# Patient Record
Sex: Female | Born: 1960
Health system: Southern US, Community
[De-identification: ages and names within clinical notes are randomized; demographics above are authoritative.]

## PROBLEM LIST (undated history)

## (undated) DIAGNOSIS — E079 Disorder of thyroid, unspecified: Secondary | ICD-10-CM

## (undated) DIAGNOSIS — E785 Hyperlipidemia, unspecified: Secondary | ICD-10-CM

## (undated) DIAGNOSIS — K219 Gastro-esophageal reflux disease without esophagitis: Secondary | ICD-10-CM

## (undated) DIAGNOSIS — F319 Bipolar disorder, unspecified: Secondary | ICD-10-CM

## (undated) DIAGNOSIS — F419 Anxiety disorder, unspecified: Secondary | ICD-10-CM

## (undated) DIAGNOSIS — F32A Depression, unspecified: Secondary | ICD-10-CM

## (undated) DIAGNOSIS — F329 Major depressive disorder, single episode, unspecified: Secondary | ICD-10-CM

## (undated) HISTORY — PX: CHOLECYSTECTOMY: SHX55

## (undated) HISTORY — PX: BACK SURGERY: SHX140

## (undated) HISTORY — PX: FRACTURE SURGERY: SHX138

## (undated) HISTORY — PX: ANKLE SURGERY: SHX546

## (undated) HISTORY — DX: Hyperlipidemia, unspecified: E78.5

## (undated) HISTORY — DX: Gastro-esophageal reflux disease without esophagitis: K21.9

## (undated) HISTORY — PX: ANTERIOR CRUCIATE LIGAMENT REPAIR: SHX115

## (undated) HISTORY — PX: TENDON REPAIR: SHX5111

## (undated) HISTORY — DX: Anxiety disorder, unspecified: F41.9

## (undated) HISTORY — DX: Depression, unspecified: F32.A

## (undated) HISTORY — DX: Major depressive disorder, single episode, unspecified: F32.9

## (undated) HISTORY — PX: TONSILLECTOMY: SUR1361

---

## 2014-02-06 ENCOUNTER — Encounter (HOSPITAL_COMMUNITY): Payer: Self-pay | Admitting: Emergency Medicine

## 2014-02-06 ENCOUNTER — Emergency Department (HOSPITAL_COMMUNITY): Payer: Medicare Other

## 2014-02-06 ENCOUNTER — Emergency Department (HOSPITAL_COMMUNITY)
Admission: EM | Admit: 2014-02-06 | Discharge: 2014-02-06 | Disposition: A | Discharge status: Home / Self Care | Payer: Medicare Other | Attending: Emergency Medicine | Admitting: Emergency Medicine

## 2014-02-06 DIAGNOSIS — Y9289 Other specified places as the place of occurrence of the external cause: Secondary | ICD-10-CM | POA: Insufficient documentation

## 2014-02-06 DIAGNOSIS — Y9389 Activity, other specified: Secondary | ICD-10-CM | POA: Insufficient documentation

## 2014-02-06 DIAGNOSIS — M25471 Effusion, right ankle: Secondary | ICD-10-CM

## 2014-02-06 DIAGNOSIS — S82491A Other fracture of shaft of right fibula, initial encounter for closed fracture: Secondary | ICD-10-CM | POA: Diagnosis not present

## 2014-02-06 DIAGNOSIS — X58XXXA Exposure to other specified factors, initial encounter: Secondary | ICD-10-CM | POA: Diagnosis not present

## 2014-02-06 DIAGNOSIS — Z8659 Personal history of other mental and behavioral disorders: Secondary | ICD-10-CM | POA: Insufficient documentation

## 2014-02-06 DIAGNOSIS — M25571 Pain in right ankle and joints of right foot: Secondary | ICD-10-CM

## 2014-02-06 DIAGNOSIS — Z8639 Personal history of other endocrine, nutritional and metabolic disease: Secondary | ICD-10-CM | POA: Insufficient documentation

## 2014-02-06 DIAGNOSIS — S99911A Unspecified injury of right ankle, initial encounter: Secondary | ICD-10-CM | POA: Diagnosis present

## 2014-02-06 DIAGNOSIS — Y998 Other external cause status: Secondary | ICD-10-CM | POA: Diagnosis not present

## 2014-02-06 DIAGNOSIS — T1490XA Injury, unspecified, initial encounter: Secondary | ICD-10-CM

## 2014-02-06 DIAGNOSIS — S82831A Other fracture of upper and lower end of right fibula, initial encounter for closed fracture: Secondary | ICD-10-CM

## 2014-02-06 HISTORY — DX: Disorder of thyroid, unspecified: E07.9

## 2014-02-06 HISTORY — DX: Bipolar disorder, unspecified: F31.9

## 2014-02-06 MED ORDER — HYDROCODONE-ACETAMINOPHEN 5-325 MG PO TABS: 1.0000 | ORAL_TABLET | Freq: Four times a day (QID) | ORAL | Status: DC | PRN

## 2014-02-06 MED ORDER — NAPROXEN 500 MG PO TABS: 500.0000 mg | ORAL_TABLET | Freq: Two times a day (BID) | ORAL | Status: DC | PRN

## 2014-02-06 NOTE — ED Notes (Signed)
Ortho Tech notified  

## 2014-02-06 NOTE — Progress Notes (Signed)
Orthopedic Tech Progress Note Patient Details:  Rachel CrawfordSusan Everett 1960/08/06 [id number]  Ortho Devices Type of Ortho Device: Ace wrap, Crutches, Post (short leg) splint Ortho Device/Splint Location: rle Ortho Device/Splint Interventions: Application   Rachel Everett 02/06/2014, 10:51 AM

## 2014-02-06 NOTE — Discharge Instructions (Signed)
Wear ankle splint until you've seen the orthopedist. Use crutches for all weight bearing activities. Ice and elevate ankle throughout the day. Alternate between naprosyn and percocet for pain relief. Do not drive or operate machinery with pain medication use. Call orthopedic follow up today or tomorrow to schedule followup appointment for within 1 week. Return to the ER for changes or worsening symptoms.   Ankle Fracture A fracture is a break in a bone. A cast or splint may be used to protect the ankle and heal the break. Sometimes, surgery is needed. HOME CARE  Use crutches as told by your doctor. It is very important that you use your crutches correctly.  Do not put weight or pressure on the injured ankle until told by your doctor.  Keep your ankle raised (elevated) when sitting or lying down.  Apply ice to the ankle:  Put ice in a plastic bag.  Place a towel between your cast and the bag.  Leave the ice on for 20 minutes, 2-3 times a day.  If you have a plaster or fiberglass cast:  Do not try to scratch under the cast with any objects.  Check the skin around the cast every day. You may put lotion on red or sore areas.  Keep your cast dry and clean.  If you have a plaster splint:  Wear the splint as told by your doctor.  You can loosen the elastic around the splint if your toes get numb, tingle, or turn cold or blue.  Do not put pressure on any part of your cast or splint. It may break. Rest your plaster splint or cast only on a pillow the first 24 hours until it is fully hardened.  Cover your cast or splint with a plastic bag during showers.  Do not lower your cast or splint into water.  Take medicine as told by your doctor.  Do not drive until your doctor says it is safe.  Follow-up with your doctor as told. It is very important that you go to your follow-up visits. GET HELP IF: The swelling and discomfort gets worse.  GET HELP RIGHT AWAY IF:   Your splint or  cast breaks.  You continue to have very bad pain.  You have new pain or swelling after your splint or cast was put on.  Your skin or toes below the injured ankle:  Turn blue or gray.  Feel cold, numb, or you cannot feel them.  There is a bad smell or yellowish white fluid (pus) coming from under the splint or cast. MAKE SURE YOU:   Understand these instructions.  Will watch your condition.  Will get help right away if you are not doing well or get worse. Document Released: 12/29/2008 Document Revised: 12/22/2012 Document Reviewed: 09/30/2012 [facility] Patient Information 2015 [place], [place]. This information is not intended to replace advice given to you by your health care provider. Make sure you discuss any questions you have with your health care provider.  Cast or Splint Care Casts and splints support injured limbs and keep bones from moving while they heal. It is important to care for your cast or splint at home.  HOME CARE INSTRUCTIONS  Keep the cast or splint uncovered during the drying period. It can take 24 to 48 hours to dry if it is made of plaster. A fiberglass cast will dry in less than 1 hour.  Do not rest the cast on anything harder than a pillow for the first 24 hours.  Do not put weight on your injured limb or apply pressure to the cast until your health care provider gives you permission.  Keep the cast or splint dry. Wet casts or splints can lose their shape and may not support the limb as well. A wet cast that has lost its shape can also create harmful pressure on your skin when it dries. Also, wet skin can become infected.  Cover the cast or splint with a plastic bag when bathing or when out in the rain or snow. If the cast is on the trunk of the body, take sponge baths until the cast is removed.  If your cast does become wet, dry it with a towel or a blow dryer on the cool setting only.  Keep your cast or splint clean. Soiled casts may be wiped with a  moistened cloth.  Do not place any hard or soft foreign objects under your cast or splint, such as cotton, toilet paper, lotion, or powder.  Do not try to scratch the skin under the cast with any object. The object could get stuck inside the cast. Also, scratching could lead to an infection. If itching is a problem, use a blow dryer on a cool setting to relieve discomfort.  Do not trim or cut your cast or remove padding from inside of it.  Exercise all joints next to the injury that are not immobilized by the cast or splint. For example, if you have a long leg cast, exercise the hip joint and toes. If you have an arm cast or splint, exercise the shoulder, elbow, thumb, and fingers.  Elevate your injured arm or leg on 1 or 2 pillows for the first 1 to 3 days to decrease swelling and pain.It is best if you can comfortably elevate your cast so it is higher than your heart. SEEK MEDICAL CARE IF:   Your cast or splint cracks.  Your cast or splint is too tight or too loose.  You have unbearable itching inside the cast.  Your cast becomes wet or develops a soft spot or area.  You have a bad smell coming from inside your cast.  You get an object stuck under your cast.  Your skin around the cast becomes red or raw.  You have new pain or worsening pain after the cast has been applied. SEEK IMMEDIATE MEDICAL CARE IF:   You have fluid leaking through the cast.  You are unable to move your fingers or toes.  You have discolored (blue or white), cool, painful, or very swollen fingers or toes beyond the cast.  You have tingling or numbness around the injured area.  You have severe pain or pressure under the cast.  You have any difficulty with your breathing or have shortness of breath.  You have chest pain. Document Released: 02/29/2000 Document Revised: 12/22/2012 Document Reviewed: 09/09/2012 [facility] Patient Information 2015 [place], [place]. This information is not intended to replace  advice given to you by your health care provider. Make sure you discuss any questions you have with your health care provider.  Cryotherapy Cryotherapy means treatment with cold. Ice or gel packs can be used to reduce both pain and swelling. Ice is the most helpful within the first 24 to 48 hours after an injury or flare-up from overusing a muscle or joint. Sprains, strains, spasms, burning pain, shooting pain, and aches can all be eased with ice. Ice can also be used when recovering from surgery. Ice is effective, has very few  side effects, and is safe for most people to use. PRECAUTIONS  Ice is not a safe treatment option for people with:  Raynaud phenomenon. This is a condition affecting small blood vessels in the extremities. Exposure to cold may cause your problems to return.  Cold hypersensitivity. There are many forms of cold hypersensitivity, including:  Cold urticaria. Red, itchy hives appear on the skin when the tissues begin to warm after being iced.  Cold erythema. This is a red, itchy rash caused by exposure to cold.  Cold hemoglobinuria. Red blood cells break down when the tissues begin to warm after being iced. The hemoglobin that carry oxygen are passed into the urine because they cannot combine with blood proteins fast enough.  Numbness or altered sensitivity in the area being iced. If you have any of the following conditions, do not use ice until you have discussed cryotherapy with your caregiver:  Heart conditions, such as arrhythmia, angina, or chronic heart disease.  High blood pressure.  Healing wounds or open skin in the area being iced.  Current infections.  Rheumatoid arthritis.  Poor circulation.  Diabetes. Ice slows the blood flow in the region it is applied. This is beneficial when trying to stop inflamed tissues from spreading irritating chemicals to surrounding tissues. However, if you expose your skin to cold temperatures for too long or without the  proper protection, you can damage your skin or nerves. Watch for signs of skin damage due to cold. HOME CARE INSTRUCTIONS Follow these tips to use ice and cold packs safely.  Place a dry or damp towel between the ice and skin. A damp towel will cool the skin more quickly, so you may need to shorten the time that the ice is used.  For a more rapid response, add gentle compression to the ice.  Ice for no more than 10 to 20 minutes at a time. The bonier the area you are icing, the less time it will take to get the benefits of ice.  Check your skin after 5 minutes to make sure there are no signs of a poor response to cold or skin damage.  Rest 20 minutes or more between uses.  Once your skin is numb, you can end your treatment. You can test numbness by very lightly touching your skin. The touch should be so light that you do not see the skin dimple from the pressure of your fingertip. When using ice, most people will feel these normal sensations in this order: cold, burning, aching, and numbness.  Do not use ice on someone who cannot communicate their responses to pain, such as small children or people with dementia. HOW TO MAKE AN ICE PACK Ice packs are the most common way to use ice therapy. Other methods include ice massage, ice baths, and cryosprays. Muscle creams that cause a cold, tingly feeling do not offer the same benefits that ice offers and should not be used as a substitute unless recommended by your caregiver. To make an ice pack, do one of the following:  Place crushed ice or a bag of frozen vegetables in a sealable plastic bag. Squeeze out the excess air. Place this bag inside another plastic bag. Slide the bag into a pillowcase or place a damp towel between your skin and the bag.  Mix 3 parts water with 1 part rubbing alcohol. Freeze the mixture in a sealable plastic bag. When you remove the mixture from the freezer, it will be slushy. Squeeze out the excess  air. Place this bag  inside another plastic bag. Slide the bag into a pillowcase or place a damp towel between your skin and the bag. SEEK MEDICAL CARE IF:  You develop white spots on your skin. This may give the skin a blotchy (mottled) appearance.  Your skin turns blue or pale.  Your skin becomes waxy or hard.  Your swelling gets worse. MAKE SURE YOU:   Understand these instructions.  Will watch your condition.  Will get help right away if you are not doing well or get worse. Document Released: 10/28/2010 Document Revised: 07/18/2013 Document Reviewed: 10/28/2010 [facility] Patient Information 2015 [place], [place]. This information is not intended to replace advice given to you by your health care provider. Make sure you discuss any questions you have with your health care provider.

## 2014-02-06 NOTE — ED Notes (Signed)
Pt twisted right ankle 6 days ago. Hx of surgery due to trauma on that ankle from 5 years ago. Ankle/foot is swollen and discolored.

## 2014-02-06 NOTE — ED Provider Notes (Signed)
CSN: [id number]     Arrival date & time 02/06/14  0904 History  This chart was scribed for non-physician practitioner, Allen Derry, PA-C, working with Samuel Jester, DO by Charline Bills, ED Scribe. This patient was seen in room TR07C/TR07C and the patient's care was started at 9:30 AM.   Chief Complaint  Patient presents with  . Ankle Injury   Patient is a 53 y.o. female presenting with lower extremity injury. The history is provided by the patient. No language interpreter was used.  Ankle Injury This is a new problem. The current episode started more than 2 days ago. The problem occurs constantly. The problem has been gradually improving. The symptoms are aggravated by walking, twisting and exertion. The symptoms are relieved by ice, acetaminophen and NSAIDs. She has tried a cold compress and acetaminophen for the symptoms. The treatment provided mild relief.   HPI Comments: Rachel Everett is a 53 y.o. female with a PSHx of R ankle surgery for torn ligaments 20yrs ago, who presents to the Emergency Department complaining of constant, gradually worsening R ankle pain onset 6 days ago. Pt reports that pain began after she twisted her ankle. Pt currently rates her pain 9/10. Pain is throbbing, located to the lateral aspect of the ankle and wrapping around posteriorly, nonradiating and exacerbated with movement and bearing weight. She has tried Tylenol, Motrin, cold compress and elevating her ankle with mild relief. Pt reports associated bruising and swelling that has improved. She denies knee pain, hip pain, LOC, hitting her head, numbness/tingling, weakness.    Past Medical History  Diagnosis Date  . Bipolar 1 disorder   . Thyroid disease    Past Surgical History  Procedure Laterality Date  . Back surgery    . Cholecystectomy    . Fracture surgery    . Tonsillectomy     No family history on file. History  Substance Use Topics  . Smoking status: Never Smoker   . Smokeless  tobacco: Not on file  . Alcohol Use: Yes     Comment: RARELY   OB History    No data available     Review of Systems  Musculoskeletal: Positive for joint swelling (R ankle), arthralgias (R ankle) and gait problem (painful). Negative for myalgias and back pain.  Skin: Positive for color change.  Neurological: Negative for syncope, weakness and numbness.  Hematological: Does not bruise/bleed easily.  10 Systems reviewed and all are negative for acute change except as noted in the HPI.  Allergies  Review of patient's allergies indicates no known allergies.  Home Medications   Prior to Admission medications   Not on File   Triage Vitals: BP 124/88 mmHg  Pulse 99  Temp(Src) 97.6 F (36.4 C) (Oral)  Resp 18  SpO2 99% Physical Exam  Constitutional: She is oriented to person, place, and time. Vital signs are normal. She appears well-developed and well-nourished.  Non-toxic appearance. No distress.  Afebrile, nontoxic, NAD  HENT:  Head: Normocephalic and atraumatic.  Mouth/Throat: Mucous membranes are normal.  Eyes: Conjunctivae and EOM are normal. Right eye exhibits no discharge. Left eye exhibits no discharge.  Neck: Normal range of motion. Neck supple.  Cardiovascular: Normal rate and intact distal pulses.   Distal pulses intact  Pulmonary/Chest: Effort normal. No respiratory distress.  Abdominal: Normal appearance. She exhibits no distension.  Musculoskeletal:       Right ankle: She exhibits decreased range of motion (due to pain), swelling and ecchymosis. She exhibits no deformity and normal  pulse. Tenderness. Lateral malleolus tenderness found. Achilles tendon normal.       Feet:  R ankle range of motion limited due to pain, with noticeable swelling and ecchymosis to the lateral aspect of the left ankle wrapping around posteriorly, with no gross deformity. Tenderness to palpation over the lateral malleolus and surrounding tissues. Achilles tendon intact without pain. No high  ankle tenderness with squeezing. No knee or hip pain. Strength with dorsiflexion and plantar flexion 5/5, but with inversion and eversion slightly decreased due to pain. Sensation grossly intact in all extremities. Distal pulses intact. Soft compartments.  Neurological: She is alert and oriented to person, place, and time. She has normal strength. No sensory deficit.  Skin: Skin is warm, dry and intact. Bruising noted.  Bruised R ankle as above  Psychiatric: She has a normal mood and affect. Her behavior is normal.  Nursing note and vitals reviewed.  ED Course  Procedures (including critical care time) DIAGNOSTIC STUDIES: Oxygen Saturation is 99% on RA, normal by my interpretation.    COORDINATION OF CARE: 9:34 AM-Discussed treatment plan which includes XRs and medication for pain with pt at bedside and pt agreed to plan.   Labs Review Labs Reviewed - No data to display  Imaging Review Dg Tibia/fibula Right  02/06/2014   CLINICAL DATA:  Larey SeatFell and twisted ankle with pain on both sides of the ankle.  EXAM: RIGHT TIBIA AND FIBULA - 2 VIEW  COMPARISON:  Right ankle 02/06/2014  FINDINGS: Surgical screws in the lateral malleolus. There is evidence for an acute avulsion fracture involving the distal tip of the lateral malleolus with adjacent soft tissue swelling. There is a plantar calcaneal spur. Ankle is located. The proximal and mid fibula is intact. No acute bone abnormality to the tibia.  IMPRESSION: Avulsion fracture involving the distal right fibula. Associated soft tissue swelling.   Electronically Signed   By: Richarda OverlieAdam  Henn M.D.   On: 02/06/2014 09:58   Dg Ankle Complete Right  02/06/2014   CLINICAL DATA:  Pain post trauma ; twisting injury  EXAM: RIGHT ANKLE - COMPLETE 3+ VIEW  COMPARISON:  None.  FINDINGS: Frontal, oblique, and lateral views were obtained. There is an avulsion along the lateral malleolus in near anatomic alignment. Postoperative changes also noted in the distal fibula. No  other fracture is apparent. There is a small joint effusion with soft tissue swelling laterally. 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 Ankle mortise does appear intact. There is a spur arising from the inferior calcaneus.  IMPRESSION: Swelling laterally with avulsion along the lateral malleolus. Underlying osteoarthritic change and postoperative change laterally. Small joint effusion.   Electronically Signed   By: Bretta BangWilliam  Woodruff M.D.   On: 02/06/2014 09:55     EKG Interpretation None      MDM   Final diagnoses:  Trauma  Closed avulsion fracture of distal fibula, right, initial encounter  Right ankle pain  Right ankle swelling    53y/o female with R ankle pain after fall. Bruised and swollen, but no signs or symptoms of septic arthritis or gout. Extremity neurovascularly intact with soft compartments. X-ray obtained which showed in avulsion fracture of the distal fibula. Applied splint and given crutches, as well as prescriptions for pain medication and orthopedic follow-up given. Patient is driving home today, therefore she cannot get any narcotic pain medications prior to leaving, but patient agreed to picking up a medications after she was done driving for the day. Discussed being very  careful while driving given that this is a right ankle injury, but patient stated that she did not have an alternative, due to the fact that [place] and has no family or friends in the area. Discussed following up with ortho as soon as possible to have evaluation of her injury. Discussed RICE therapy. I explained the diagnosis and have given explicit precautions to return to the ER including for any other new or worsening symptoms. The patient understands and accepts the medical plan as it's been dictated and I have answered their questions. Discharge instructions concerning home care and prescriptions have been given. The patient is STABLE and is discharged  to home in good condition.   I personally performed the services described in this documentation, which was scribed in my presence. The recorded information has been reviewed and is accurate.  BP 124/88 mmHg  Pulse 99  Temp(Src) 97.6 F (36.4 C) (Oral)  Resp 18  SpO2 99%  Meds ordered this encounter  Medications  . naproxen (NAPROSYN) 500 MG tablet    Sig: Take 1 tablet (500 mg total) by mouth 2 (two) times daily as needed for mild pain, moderate pain or headache (TAKE WITH MEALS.).    Dispense:  20 tablet    Refill:  0    Order Specific Question:  Supervising Provider    Answer:  Eber HongMILLER, BRIAN D [[id number]]  . HYDROcodone-acetaminophen (NORCO) 5-325 MG per tablet    Sig: Take 1-2 tablets by mouth every 6 (six) hours as needed for severe pain.    Dispense:  20 tablet    Refill:  0    Order Specific Question:  Supervising Provider    Answer:  Vida RollerMILLER, BRIAN D [address][[id number]]     Anya Murphey Strupp Camprubi-Soms, PA-C 02/06/14 1044  Samuel JesterKathleen McManus, OhioDO 02/07/14 [phone]

## 2014-04-12 DIAGNOSIS — S8265XD Nondisplaced fracture of lateral malleolus of left fibula, subsequent encounter for closed fracture with routine healing: Secondary | ICD-10-CM | POA: Diagnosis not present

## 2014-05-02 DIAGNOSIS — F0634 Mood disorder due to known physiological condition with mixed features: Secondary | ICD-10-CM | POA: Diagnosis not present

## 2014-05-09 DIAGNOSIS — F3130 Bipolar disorder, current episode depressed, mild or moderate severity, unspecified: Secondary | ICD-10-CM | POA: Diagnosis not present

## 2014-07-13 DIAGNOSIS — F3132 Bipolar disorder, current episode depressed, moderate: Secondary | ICD-10-CM | POA: Diagnosis not present

## 2014-07-17 DIAGNOSIS — F3132 Bipolar disorder, current episode depressed, moderate: Secondary | ICD-10-CM | POA: Diagnosis not present

## 2014-07-20 DIAGNOSIS — F3132 Bipolar disorder, current episode depressed, moderate: Secondary | ICD-10-CM | POA: Diagnosis not present

## 2014-08-31 ENCOUNTER — Ambulatory Visit: Payer: Medicare Other | Admitting: Internal Medicine

## 2014-08-31 DIAGNOSIS — F3132 Bipolar disorder, current episode depressed, moderate: Secondary | ICD-10-CM | POA: Diagnosis not present

## 2014-09-05 DIAGNOSIS — F3132 Bipolar disorder, current episode depressed, moderate: Secondary | ICD-10-CM | POA: Diagnosis not present

## 2014-09-07 ENCOUNTER — Ambulatory Visit: Payer: Medicare Other | Attending: Internal Medicine | Admitting: Internal Medicine

## 2014-09-07 ENCOUNTER — Encounter: Payer: Self-pay | Admitting: Internal Medicine

## 2014-09-07 ENCOUNTER — Other Ambulatory Visit: Payer: Self-pay

## 2014-09-07 VITALS — BP 107/71 | HR 67 | Temp 98.7°F | Wt 169.6 lb

## 2014-09-07 DIAGNOSIS — Z1231 Encounter for screening mammogram for malignant neoplasm of breast: Secondary | ICD-10-CM

## 2014-09-07 DIAGNOSIS — F319 Bipolar disorder, unspecified: Secondary | ICD-10-CM | POA: Diagnosis not present

## 2014-09-07 DIAGNOSIS — K219 Gastro-esophageal reflux disease without esophagitis: Secondary | ICD-10-CM

## 2014-09-07 DIAGNOSIS — E039 Hypothyroidism, unspecified: Secondary | ICD-10-CM | POA: Diagnosis not present

## 2014-09-07 DIAGNOSIS — E785 Hyperlipidemia, unspecified: Secondary | ICD-10-CM

## 2014-09-07 DIAGNOSIS — Z1239 Encounter for other screening for malignant neoplasm of breast: Secondary | ICD-10-CM

## 2014-09-07 DIAGNOSIS — Z1211 Encounter for screening for malignant neoplasm of colon: Secondary | ICD-10-CM

## 2014-09-07 LAB — COMPLETE METABOLIC PANEL WITH GFR
ALT: 16 U/L (ref 0–35)
AST: 17 U/L (ref 0–37)
Albumin: 4.4 g/dL (ref 3.5–5.2)
Alkaline Phosphatase: 80 U/L (ref 39–117)
BILIRUBIN TOTAL: 0.5 mg/dL (ref 0.2–1.2)
BUN: 9 mg/dL (ref 6–23)
CO2: 29 meq/L (ref 19–32)
CREATININE: 0.96 mg/dL (ref 0.50–1.10)
Calcium: 9.8 mg/dL (ref 8.4–10.5)
Chloride: 106 mEq/L (ref 96–112)
GFR, Est African American: 78 mL/min
GFR, Est Non African American: 67 mL/min
Glucose, Bld: 104 mg/dL — ABNORMAL HIGH (ref 70–99)
Potassium: 5.1 mEq/L (ref 3.5–5.3)
Sodium: 142 mEq/L (ref 135–145)
Total Protein: 6.9 g/dL (ref 6.0–8.3)

## 2014-09-07 LAB — LIPID PANEL
Cholesterol: 127 mg/dL (ref 0–200)
HDL: 60 mg/dL
LDL Cholesterol: 48 mg/dL (ref 0–99)
Total CHOL/HDL Ratio: 2.1 ratio
Triglycerides: 94 mg/dL
VLDL: 19 mg/dL (ref 0–40)

## 2014-09-07 LAB — CBC
HCT: 37.7 % (ref 36.0–46.0)
Hemoglobin: 12.4 g/dL (ref 12.0–15.0)
MCH: 30.7 pg (ref 26.0–34.0)
MCHC: 32.9 g/dL (ref 30.0–36.0)
MCV: 93.3 fL (ref 78.0–100.0)
MPV: 10.1 fL (ref 8.6–12.4)
Platelets: 276 10*3/uL (ref 150–400)
RBC: 4.04 MIL/uL (ref 3.87–5.11)
RDW: 13.7 % (ref 11.5–15.5)
WBC: 7.4 10*3/uL (ref 4.0–10.5)

## 2014-09-07 LAB — TSH: TSH: 8.486 u[IU]/mL — AB (ref 0.350–4.500)

## 2014-09-07 LAB — T4, FREE: Free T4: 1.1 ng/dL (ref 0.80–1.80)

## 2014-09-07 MED ORDER — NEXIUM 40 MG PO CPDR: 40.0000 mg | DELAYED_RELEASE_CAPSULE | Freq: Once | ORAL | Status: DC

## 2014-09-07 MED ORDER — ATORVASTATIN CALCIUM 40 MG PO TABS: 40.0000 mg | ORAL_TABLET | Freq: Every day | ORAL | Status: DC

## 2014-09-07 NOTE — Patient Instructions (Addendum)
Will refill synthroid once TSH levels come back  You will be called for mammogram appt and colonoscopy

## 2014-09-07 NOTE — Progress Notes (Signed)
Patient ID: Rachel Everett, female   DOB: 05-Jun-1960, 54 y.o.   MRN: [id number]  WUJ:[id number]  [id number]:[id number]  DOB - Apr 24, 1960  CC:  Chief Complaint  Patient presents with  . New patient       HPI: Rachel Everett is a 54 y.o. female here today to establish medical care. Patient has a past medical history hypothyroidism, HLD, GERD, and bipolar disorder. She has a family history of breast cancer in her mother and grandmother. Patient reports that her last mammogram and pap was over 5 years ago. She has not been off her synthroid but it is time for a refill.   She goes to [facility] for therapy and has appointment with the prescribing provider July 18th.   No Known Allergies Past Medical History  Diagnosis Date  . Bipolar 1 disorder   . Thyroid disease   . Hyperlipidemia    Current Outpatient Prescriptions on File Prior to Visit  Medication Sig Dispense Refill  . HYDROcodone-acetaminophen (NORCO) 5-325 MG per tablet Take 1-2 tablets by mouth every 6 (six) hours as needed for severe pain. (Patient not taking: Reported on 09/07/2014) 20 tablet 0  . naproxen (NAPROSYN) 500 MG tablet Take 1 tablet (500 mg total) by mouth 2 (two) times daily as needed for mild pain, moderate pain or headache (TAKE WITH MEALS.). (Patient not taking: Reported on 09/07/2014) 20 tablet 0   No current facility-administered medications on file prior to visit.   Family History  Problem Relation Age of Onset  . Cancer Mother   . Cancer Maternal Grandmother    History   Social History  . Marital Status: Single    Spouse Name: N/A  . Number of Children: N/A  . Years of Education: N/A   Occupational History  . Not on file.   Social History Main Topics  . Smoking status: Never Smoker   . Smokeless tobacco: Not on file  . Alcohol Use: Yes     Comment: RARELY  . Drug Use: No  . Sexual Activity: Not on file   Other Topics Concern  . Not on file   Social History Narrative    Review of Systems    Cardiovascular: Negative.   Gastrointestinal: Positive for heartburn. Negative for abdominal pain and blood in stool.  Psychiatric/Behavioral: Positive for depression. Negative for suicidal ideas. The patient is nervous/anxious.   All other systems reviewed and are negative.     Objective:   Filed Vitals:   09/07/14 1054  BP: 107/71  Pulse: 67  Temp: 98.7 F (37.1 C)    Physical Exam  Constitutional: She is oriented to person, place, and time.  Cardiovascular: Normal rate, regular rhythm and normal heart sounds.   Pulmonary/Chest: Effort normal and breath sounds normal. Right breast exhibits no mass. Left breast exhibits no mass.  Genitourinary: No breast tenderness or discharge.  Neurological: She is alert and oriented to person, place, and time.     No results found for: WBC, HGB, HCT, MCV, PLT No results found for: CREATININE, BUN, NA, K, CL, CO2  No results found for: HGBA1C Lipid Panel  No results found for: CHOL, TRIG, HDL, CHOLHDL, VLDL, LDLCALC     Assessment and plan:   Kestra was seen today for new patient.  Diagnoses and all orders for this visit:  Hypothyroidism, unspecified hypothyroidism type Orders: -     TSH -     T4, Free Will send refill of synthroid when I get blood results  HLD (hyperlipidemia) Orders: -  Lipid panel -     Refill atorvastatin (LIPITOR) 40 MG tablet; Take 1 tablet (40 mg total) by mouth daily. Education provided on proper lifestyle changes in order to lower cholesterol. Patient advised to maintain healthy weight and to keep total fat intake at 25-35% of total calories and carbohydrates 50-60% of total daily calories. Explained how high cholesterol places patient at risk for heart disease. Patient placed on appropriate medication and repeat labs in 6 months   Gastroesophageal reflux disease, esophagitis presence not specified Orders: -     CBC -     COMPLETE METABOLIC PANEL WITH GFR -    Refill NEXIUM 40 MG capsule; Take  1 capsule (40 mg total) by mouth once. Discussed diet and weight with patient relating to acid reflux.  Went over things that may exacerbate acid reflux such as tomatoes, spicy foods, coffee, carbonated beverages, chocolates, etc.  Advised patient to avoid laying down at least two hours after meals and sleep with HOB elevated.   Bipolar affective disorder, most recent episode unspecified type, remission status unspecified Orders: -     Lipid panel -     Lithium level Continue to see therapist and [facility] for medication  Breast cancer screening, high risk patient Orders: -     MM Digital Screening; Future  Colon cancer screening Orders: -     HM COLONOSCOPY   Return for Pap when ready .    Holland Commons, NP-C [facility] [phone] 09/07/2014, 11:12 AM

## 2014-09-07 NOTE — Progress Notes (Signed)
  New patient here to established care. Pt just moved to the area. She is requesting refills on all medication.

## 2014-09-08 LAB — LITHIUM LEVEL: Lithium Lvl: 0.7 mEq/L — ABNORMAL LOW (ref 0.80–1.40)

## 2014-09-11 ENCOUNTER — Other Ambulatory Visit: Payer: Self-pay | Admitting: Internal Medicine

## 2014-09-11 ENCOUNTER — Telehealth: Payer: Self-pay | Admitting: Internal Medicine

## 2014-09-11 DIAGNOSIS — E079 Disorder of thyroid, unspecified: Secondary | ICD-10-CM | POA: Insufficient documentation

## 2014-09-11 MED ORDER — LEVOTHYROXINE SODIUM 112 MCG PO TABS: 112.0000 ug | ORAL_TABLET | Freq: Every day | ORAL | Status: DC

## 2014-09-11 NOTE — Progress Notes (Signed)
Left message to call back  

## 2014-09-11 NOTE — Telephone Encounter (Signed)
Patient called returning nurse's call to review results. Please f/u  °

## 2014-09-11 NOTE — Progress Notes (Signed)
Left message to call back on cell phone

## 2014-09-12 ENCOUNTER — Other Ambulatory Visit: Payer: Self-pay

## 2014-09-12 ENCOUNTER — Telehealth: Payer: Self-pay | Admitting: Internal Medicine

## 2014-09-12 NOTE — Telephone Encounter (Signed)
Spoke with patient.

## 2014-09-12 NOTE — Telephone Encounter (Signed)
Patient is returning phone call in regards to her results, please f/u

## 2014-09-12 NOTE — Telephone Encounter (Signed)
Left message to call back  

## 2014-09-12 NOTE — Telephone Encounter (Signed)
Spoke with patient regarding lab work.

## 2014-09-14 DIAGNOSIS — F3132 Bipolar disorder, current episode depressed, moderate: Secondary | ICD-10-CM | POA: Diagnosis not present

## 2014-09-21 DIAGNOSIS — F3132 Bipolar disorder, current episode depressed, moderate: Secondary | ICD-10-CM | POA: Diagnosis not present

## 2014-10-02 DIAGNOSIS — F3132 Bipolar disorder, current episode depressed, moderate: Secondary | ICD-10-CM | POA: Diagnosis not present

## 2014-10-05 DIAGNOSIS — F3132 Bipolar disorder, current episode depressed, moderate: Secondary | ICD-10-CM | POA: Diagnosis not present

## 2014-10-18 DIAGNOSIS — F3132 Bipolar disorder, current episode depressed, moderate: Secondary | ICD-10-CM | POA: Diagnosis not present

## 2014-10-27 DIAGNOSIS — F3132 Bipolar disorder, current episode depressed, moderate: Secondary | ICD-10-CM | POA: Diagnosis not present

## 2014-11-07 DIAGNOSIS — F3132 Bipolar disorder, current episode depressed, moderate: Secondary | ICD-10-CM | POA: Diagnosis not present

## 2014-11-09 ENCOUNTER — Encounter: Payer: Self-pay | Admitting: Pharmacist

## 2014-11-10 DIAGNOSIS — F3132 Bipolar disorder, current episode depressed, moderate: Secondary | ICD-10-CM | POA: Diagnosis not present

## 2014-11-16 DIAGNOSIS — F3132 Bipolar disorder, current episode depressed, moderate: Secondary | ICD-10-CM | POA: Diagnosis not present

## 2015-01-17 ENCOUNTER — Other Ambulatory Visit: Payer: Self-pay | Admitting: Internal Medicine

## 2015-01-19 DIAGNOSIS — F3132 Bipolar disorder, current episode depressed, moderate: Secondary | ICD-10-CM | POA: Diagnosis not present

## 2015-02-01 ENCOUNTER — Ambulatory Visit: Payer: Medicare Other | Attending: Internal Medicine | Admitting: Internal Medicine

## 2015-02-01 ENCOUNTER — Encounter: Payer: Self-pay | Admitting: Internal Medicine

## 2015-02-01 ENCOUNTER — Telehealth: Payer: Self-pay | Admitting: Internal Medicine

## 2015-02-01 VITALS — BP 116/73 | HR 74 | Temp 98.0°F | Resp 16 | Ht 65.0 in | Wt 164.4 lb

## 2015-02-01 DIAGNOSIS — Z79899 Other long term (current) drug therapy: Secondary | ICD-10-CM | POA: Insufficient documentation

## 2015-02-01 DIAGNOSIS — K219 Gastro-esophageal reflux disease without esophagitis: Secondary | ICD-10-CM | POA: Diagnosis not present

## 2015-02-01 DIAGNOSIS — N3000 Acute cystitis without hematuria: Secondary | ICD-10-CM | POA: Diagnosis not present

## 2015-02-01 DIAGNOSIS — R1030 Lower abdominal pain, unspecified: Secondary | ICD-10-CM

## 2015-02-01 DIAGNOSIS — M79604 Pain in right leg: Secondary | ICD-10-CM

## 2015-02-01 DIAGNOSIS — M545 Low back pain, unspecified: Secondary | ICD-10-CM

## 2015-02-01 DIAGNOSIS — F319 Bipolar disorder, unspecified: Secondary | ICD-10-CM | POA: Diagnosis not present

## 2015-02-01 DIAGNOSIS — E079 Disorder of thyroid, unspecified: Secondary | ICD-10-CM | POA: Diagnosis not present

## 2015-02-01 DIAGNOSIS — E785 Hyperlipidemia, unspecified: Secondary | ICD-10-CM

## 2015-02-01 DIAGNOSIS — M79661 Pain in right lower leg: Secondary | ICD-10-CM | POA: Insufficient documentation

## 2015-02-01 LAB — POCT URINALYSIS DIPSTICK
Bilirubin, UA: NEGATIVE
Glucose, UA: NEGATIVE
Ketones, UA: NEGATIVE
Nitrite, UA: NEGATIVE
RBC UA: NEGATIVE
Spec Grav, UA: 1.015
UROBILINOGEN UA: 0.2
pH, UA: 5.5

## 2015-02-01 MED ORDER — ATORVASTATIN CALCIUM 40 MG PO TABS: 40.0000 mg | ORAL_TABLET | Freq: Every day | ORAL | Status: DC

## 2015-02-01 MED ORDER — OMEPRAZOLE 20 MG PO CPDR: 20.0000 mg | DELAYED_RELEASE_CAPSULE | Freq: Every day | ORAL | Status: DC

## 2015-02-01 MED ORDER — TRAMADOL HCL 50 MG PO TABS: 50.0000 mg | ORAL_TABLET | Freq: Two times a day (BID) | ORAL | Status: DC | PRN

## 2015-02-01 MED ORDER — LEVOTHYROXINE SODIUM 112 MCG PO TABS
112.0000 ug | ORAL_TABLET | Freq: Every day | ORAL | Status: DC
Start: 2015-02-01 — End: 2015-06-21

## 2015-02-01 MED ORDER — NITROFURANTOIN MONOHYD MACRO 100 MG PO CAPS: 100.0000 mg | ORAL_CAPSULE | Freq: Two times a day (BID) | ORAL | Status: DC

## 2015-02-01 NOTE — Patient Instructions (Signed)
Apply for [facility] discount so that we can send you to Orthopedics

## 2015-02-01 NOTE — Progress Notes (Signed)
Patient stated she had ankle surgery and back surgery and is still complaining of pain to  Both areas Patient also states she thinks she has a bladder infection  Patient is having some lower abd pain when she voids

## 2015-02-01 NOTE — Telephone Encounter (Signed)
Patient has questions about her orthopaedic referral.

## 2015-02-01 NOTE — Progress Notes (Signed)
Patient ID: Rachel Everett, female   DOB: 22-Mar-1960, 54 y.o.   MRN: [id number]  CC: pain  HPI: Rachel Everett is a 54 y.o. female here today for a follow up visit.  Patient has past medical history of bipolar disorder, thyroid disease, and HLD. Today patient is concerned about right lower lateral leg pain. She states that she had right foot tendon repair 12 years ago. Last year she fell in a hole and broke her tibia and is now been experiencing pain since that time.  Patient states that she had back surgery 15 years ago. Now having lower back pain for the past 4 months that is aggravated by sitting or standing for long periods of time. Pain described as dull and sharp pain. No bowel or bladder dysfunction. Has been taking tylenol and Aleve but it no longer helps her pain.  Patient also complains of lower abdominal pain when she urinates for the past 3 weeks. Some chills, nausea, dysuria. No blood noticed.  No vaginal discharge..  No Known Allergies Past Medical History  Diagnosis Date  . Bipolar 1 disorder (HCC)   . Thyroid disease   . Hyperlipidemia    Current Outpatient Prescriptions on File Prior to Visit  Medication Sig Dispense Refill  . atorvastatin (LIPITOR) 40 MG tablet Take 1 tablet (40 mg total) by mouth daily. 30 tablet 4  . lamoTRIgine (LAMICTAL) 200 MG tablet Take 200 mg by mouth daily.   0  . levothyroxine (SYNTHROID, LEVOTHROID) 112 MCG tablet TAKE 1 TABLET BY MOUTH DAILY 30 tablet 3  . lithium carbonate (LITHOBID) 300 MG CR tablet   0  . NEXIUM 40 MG capsule Take 1 capsule (40 mg total) by mouth once. 30 capsule 4  . QUEtiapine (SEROQUEL) 200 MG tablet Take 200 mg by mouth.   0  . HYDROcodone-acetaminophen (NORCO) 5-325 MG per tablet Take 1-2 tablets by mouth every 6 (six) hours as needed for severe pain. (Patient not taking: Reported on 09/07/2014) 20 tablet 0  . LORazepam (ATIVAN) 1 MG tablet Take 1 mg by mouth every 8 (eight) hours as needed.   0  . naproxen (NAPROSYN) 500 MG  tablet Take 1 tablet (500 mg total) by mouth 2 (two) times daily as needed for mild pain, moderate pain or headache (TAKE WITH MEALS.). (Patient not taking: Reported on 09/07/2014) 20 tablet 0   No current facility-administered medications on file prior to visit.   Family History  Problem Relation Age of Onset  . Cancer Mother   . Cancer Maternal Grandmother    Social History   Social History  . Marital Status: Single    Spouse Name: N/A  . Number of Children: N/A  . Years of Education: N/A   Occupational History  . Not on file.   Social History Main Topics  . Smoking status: Never Smoker   . Smokeless tobacco: Not on file  . Alcohol Use: Yes     Comment: RARELY  . Drug Use: No  . Sexual Activity: Not on file   Other Topics Concern  . Not on file   Social History Narrative    Review of Systems: Other than what is stated in HPI, all other systems are negative.   Objective:   Filed Vitals:   02/01/15 1205  BP: 116/73  Pulse: 74  Temp: 98 F (36.7 C)  Resp: 16    Physical Exam  Constitutional: She is oriented to person, place, and time.  Cardiovascular: Normal rate, regular rhythm  and normal heart sounds.   Pulmonary/Chest: Effort normal and breath sounds normal.  Abdominal: Soft. Bowel sounds are normal. She exhibits no distension. There is no tenderness.  Musculoskeletal: She exhibits no edema.  Neurological: She is alert and oriented to person, place, and time.  Skin: Skin is warm and dry.  Psychiatric: She has a normal mood and affect.     Lab Results  Component Value Date   WBC 7.4 09/07/2014   HGB 12.4 09/07/2014   HCT 37.7 09/07/2014   MCV 93.3 09/07/2014   PLT 276 09/07/2014   Lab Results  Component Value Date   CREATININE 0.96 09/07/2014   BUN 9 09/07/2014   NA 142 09/07/2014   K 5.1 09/07/2014   CL 106 09/07/2014   CO2 29 09/07/2014    No results found for: HGBA1C Lipid Panel     Component Value Date/Time   CHOL 127 09/07/2014  1135   TRIG 94 09/07/2014 1135   HDL 60 09/07/2014 1135   CHOLHDL 2.1 09/07/2014 1135   VLDL 19 09/07/2014 1135   LDLCALC 48 09/07/2014 1135       Assessment and plan:   Rachel Everett was seen today for ankle pain.  Diagnoses and all orders for this visit:  Lower abdominal pain -     Urinalysis Dipstick Related to UTI  Acute cystitis without hematuria -     Begin nitrofurantoin, macrocrystal-monohydrate, (MACROBID) 100 MG capsule; Take 1 capsule (100 mg total) by mouth 2 (two) times daily.  HLD (hyperlipidemia) -    atorvastatin (LIPITOR) 40 MG tablet; Take 1 tablet (40 mg total) by mouth daily. Stable, meds refilled  Thyroid disease -     TSH Will refill based off lab results.  Right leg pain Will send to Ortho once she gets [facility] discount  Bilateral low back pain without sciatica -     traMADol (ULTRAM) 50 MG tablet; Take 1 tablet (50 mg total) by mouth every 12 (twelve) hours as needed. See above  Gastroesophageal reflux disease, esophagitis presence not specified -     omeprazole (PRILOSEC) 20 MG capsule; Take 1 capsule (20 mg total) by mouth daily. Stable refilled meds  Return if symptoms worsen or fail to improve.       Ambrose Finland, NP-C [facility] [phone] 02/01/2015, 12:36 PM

## 2015-02-02 ENCOUNTER — Telehealth: Payer: Self-pay

## 2015-02-02 DIAGNOSIS — E079 Disorder of thyroid, unspecified: Secondary | ICD-10-CM

## 2015-02-02 DIAGNOSIS — E785 Hyperlipidemia, unspecified: Secondary | ICD-10-CM

## 2015-02-02 LAB — TSH: TSH: 4.264 u[IU]/mL (ref 0.350–4.500)

## 2015-02-02 MED ORDER — ATORVASTATIN CALCIUM 40 MG PO TABS: 40.0000 mg | ORAL_TABLET | Freq: Every day | ORAL | Status: DC

## 2015-02-02 MED ORDER — LEVOTHYROXINE SODIUM 112 MCG PO TABS: 112.0000 ug | ORAL_TABLET | Freq: Every day | ORAL | Status: DC

## 2015-02-02 NOTE — Telephone Encounter (Signed)
Spoke with patient this am and she is aware of her lab results Patient [facility]

## 2015-02-02 NOTE — Telephone Encounter (Signed)
-----   Message from Ambrose FinlandValerie A Keck, NP sent at 02/02/2015  9:13 AM EST ----- Thyroid normal. May stay on current dose of Synthroid. Please refill

## 2015-03-28 MED FILL — ?OMEPRAZOLE DR 20 MG CAPSUL: 20 | 30 days supply | Qty: 30 | Fill #1

## 2015-04-11 MED FILL — ?ATORVASTATIN 40MG TABLET: 40 | 30 days supply | Qty: 30 | Fill #1

## 2015-04-13 MED FILL — ?LEVOTHYROXINE 112 MCG TAB: 112 | 30 days supply | Qty: 30 | Fill #0

## 2015-05-24 DIAGNOSIS — F3132 Bipolar disorder, current episode depressed, moderate: Secondary | ICD-10-CM | POA: Diagnosis not present

## 2015-05-24 MED FILL — ?ATORVASTATIN 40MG TABLET: 40 | 30 days supply | Qty: 30 | Fill #2

## 2015-05-24 MED FILL — ?OMEPRAZOLE DR 20 MG CAPSUL: 20 | 28 days supply | Qty: 28 | Fill #2

## 2015-05-24 MED FILL — ?LEVOTHYROXINE 112 MCG TAB: 112 | 30 days supply | Qty: 30 | Fill #1

## 2015-06-21 ENCOUNTER — Other Ambulatory Visit: Payer: Self-pay | Admitting: Internal Medicine

## 2015-06-22 MED FILL — LEVOTHYROXINE 112 MCG TAB: 112 | 30 days supply | Qty: 30 | Fill #0

## 2015-06-22 MED FILL — OMEPRAZOLE DR 20 MG CAPSULE: 20 | 28 days supply | Qty: 28 | Fill #3

## 2015-06-22 MED FILL — ATORVASTATIN 40 MG TABLET: 40 | 30 days supply | Qty: 30 | Fill #3

## 2015-07-03 ENCOUNTER — Emergency Department (HOSPITAL_COMMUNITY)
Admission: EM | Admit: 2015-07-03 | Discharge: 2015-07-04 | Disposition: A | Discharge status: Home / Self Care | Payer: Commercial Managed Care - HMO | Attending: Emergency Medicine | Admitting: Emergency Medicine

## 2015-07-03 ENCOUNTER — Encounter (HOSPITAL_COMMUNITY): Payer: Self-pay | Admitting: *Deleted

## 2015-07-03 ENCOUNTER — Emergency Department (HOSPITAL_COMMUNITY): Payer: Commercial Managed Care - HMO

## 2015-07-03 DIAGNOSIS — Z792 Long term (current) use of antibiotics: Secondary | ICD-10-CM | POA: Insufficient documentation

## 2015-07-03 DIAGNOSIS — R61 Generalized hyperhidrosis: Secondary | ICD-10-CM | POA: Insufficient documentation

## 2015-07-03 DIAGNOSIS — R634 Abnormal weight loss: Secondary | ICD-10-CM | POA: Diagnosis not present

## 2015-07-03 DIAGNOSIS — R0602 Shortness of breath: Secondary | ICD-10-CM | POA: Insufficient documentation

## 2015-07-03 DIAGNOSIS — R42 Dizziness and giddiness: Secondary | ICD-10-CM | POA: Diagnosis not present

## 2015-07-03 DIAGNOSIS — R0789 Other chest pain: Secondary | ICD-10-CM

## 2015-07-03 DIAGNOSIS — E785 Hyperlipidemia, unspecified: Secondary | ICD-10-CM | POA: Diagnosis not present

## 2015-07-03 DIAGNOSIS — E079 Disorder of thyroid, unspecified: Secondary | ICD-10-CM | POA: Insufficient documentation

## 2015-07-03 DIAGNOSIS — Z79899 Other long term (current) drug therapy: Secondary | ICD-10-CM | POA: Insufficient documentation

## 2015-07-03 DIAGNOSIS — R200 Anesthesia of skin: Secondary | ICD-10-CM | POA: Insufficient documentation

## 2015-07-03 DIAGNOSIS — F319 Bipolar disorder, unspecified: Secondary | ICD-10-CM | POA: Insufficient documentation

## 2015-07-03 DIAGNOSIS — R110 Nausea: Secondary | ICD-10-CM | POA: Insufficient documentation

## 2015-07-03 DIAGNOSIS — R079 Chest pain, unspecified: Secondary | ICD-10-CM | POA: Insufficient documentation

## 2015-07-03 LAB — I-STAT TROPONIN, ED: TROPONIN I, POC: 0.01 ng/mL (ref 0.00–0.08)

## 2015-07-03 LAB — BASIC METABOLIC PANEL
ANION GAP: 8 (ref 5–15)
BUN: 5 mg/dL — ABNORMAL LOW (ref 6–20)
CALCIUM: 9.4 mg/dL (ref 8.9–10.3)
CO2: 27 mmol/L (ref 22–32)
Chloride: 106 mmol/L (ref 101–111)
Creatinine, Ser: 0.92 mg/dL (ref 0.44–1.00)
GFR calc Af Amer: >60 mL/min (ref >60)
Glucose, Bld: 112 mg/dL — ABNORMAL HIGH (ref 65–99)
POTASSIUM: 3.7 mmol/L (ref 3.5–5.1)
SODIUM: 141 mmol/L (ref 135–145)

## 2015-07-03 LAB — CBC
HEMATOCRIT: 39.7 % (ref 36.0–46.0)
HEMOGLOBIN: 12.8 g/dL (ref 12.0–15.0)
MCH: 30.1 pg (ref 26.0–34.0)
MCHC: 32.2 g/dL (ref 30.0–36.0)
MCV: 93.4 fL (ref 78.0–100.0)
Platelets: 273 10*3/uL (ref 150–400)
RBC: 4.25 MIL/uL (ref 3.87–5.11)
RDW: 13.8 % (ref 11.5–15.5)
WBC: 7.3 10*3/uL (ref 4.0–10.5)

## 2015-07-03 MED ORDER — ONDANSETRON HCL 4 MG/2ML IJ SOLN
4.0000 mg | Freq: Once | INTRAMUSCULAR | Status: AC
  Administered 2015-07-04: 4 mg via INTRAVENOUS
  Filled 2015-07-03: qty 2

## 2015-07-03 MED ORDER — KETOROLAC TROMETHAMINE 30 MG/ML IJ SOLN
30.0000 mg | Freq: Once | INTRAMUSCULAR | Status: AC
  Administered 2015-07-04: 30 mg via INTRAVENOUS
  Filled 2015-07-03: qty 1

## 2015-07-03 NOTE — ED Notes (Signed)
Pt with intermittent CP x 6months.  Has not been medically evaluated for this.  Today pain worse than usual so pt wanted to be evaluated.  Pain is in sternal area, sharp and associated with nausea.  No sob

## 2015-07-03 NOTE — ED Provider Notes (Signed)
CSN: [id number]     Arrival date & time 07/03/15  1721 History   By signing my name below, I, Rachel Organshley Everett, attest that this documentation has been prepared under the direction and in the presence of Dione Boozeavid Axavier Pressley, MD.  Electronically Signed: Arlan OrganAshley Everett, ED Scribe. 07/03/2015. 11:51 PM.   Chief Complaint  Patient presents with  . Chest Pain   The history is provided by the patient. No language interpreter was used.    HPI Comments: Rachel Everett is a 55 y.o. female with a PMHx of thyroid disease and hyperlipidemia who presents to the Emergency Department complaining of intermittent, ongoing mid sternal chest pain with intermittent diaphoresis x 5-6 months; worsened today. Pain is described as sharp and currently rated 4/10. No aggravating or alleviating factors reported. She also reports mild numbness into the L arm and to L 3rd and 4th digits x 1 day. Ongoing unexpected weight loss in the last several weeks. She states she has lost approximately 35 pounds. Ongoing nausea, intermittent lightheadedness, and mild shortness of breath also reported which has been ongoing for the last 3 months. No OTC medications or home remedies attempted prior to arrival. No recent fever, chills, vomiting, abdominal pain, or diarrhea. She denies any abnormal bowel habits. [name] states she attempted to contact her PCP today but says she was not able to schedule an appointment as her PCP is no longer with the practice. She was then advised to come to the Emergency Department for further evaluation. She is a former 1 pack a day smoker but quiet several years ago.  No known allergies to medications.  PCP: Ambrose FinlandValerie A Keck, NP    Past Medical History  Diagnosis Date  . Bipolar 1 disorder (HCC)   . Thyroid disease   . Hyperlipidemia    Past Surgical History  Procedure Laterality Date  . Back surgery    . Cholecystectomy    . Fracture surgery    . Tonsillectomy     Family History  Problem Relation Age of Onset   . Cancer Mother   . Cancer Maternal Grandmother    Social History  Substance Use Topics  . Smoking status: Never Smoker   . Smokeless tobacco: None  . Alcohol Use: Yes     Comment: RARELY   OB History    No data available     Review of Systems  Constitutional: Positive for diaphoresis and unexpected weight change. Negative for fever and chills.  Respiratory: Positive for shortness of breath. Negative for cough.   Cardiovascular: Positive for chest pain.  Gastrointestinal: Positive for nausea. Negative for vomiting, abdominal pain and diarrhea.  Neurological: Positive for light-headedness and numbness. Negative for headaches.  Psychiatric/Behavioral: Negative for confusion.  All other systems reviewed and are negative.     Allergies  Review of patient's allergies indicates no known allergies.  Home Medications   Prior to Admission medications   Medication Sig Start Date End Date Taking? Authorizing Provider  atorvastatin (LIPITOR) 40 MG tablet Take 1 tablet (40 mg total) by mouth daily. 02/02/15   Ambrose FinlandValerie A Keck, NP  HYDROcodone-acetaminophen (NORCO) 5-325 MG per tablet Take 1-2 tablets by mouth every 6 (six) hours as needed for severe pain. Patient not taking: Reported on 09/07/2014 02/06/14   Mercedes Camprubi-Soms, PA-C  hydrOXYzine (ATARAX/VISTARIL) 50 MG tablet Take 50 mg by mouth 3 (three) times daily as needed.    Historical Provider, MD  lamoTRIgine (LAMICTAL) 200 MG tablet Take 200 mg by mouth daily.  08/20/14   Historical Provider, MD  levothyroxine (SYNTHROID, LEVOTHROID) 112 MCG tablet Take 1 tablet (112 mcg total) by mouth daily. Needs office visits for refills 06/21/15   Ambrose Finland, NP  lithium carbonate (LITHOBID) 300 MG CR tablet  06/27/14   Historical Provider, MD  LORazepam (ATIVAN) 1 MG tablet Take 1 mg by mouth every 8 (eight) hours as needed.  08/20/14   Historical Provider, MD  naproxen (NAPROSYN) 500 MG tablet Take 1 tablet (500 mg total) by mouth 2 (two)  times daily as needed for mild pain, moderate pain or headache (TAKE WITH MEALS.). Patient not taking: Reported on 09/07/2014 02/06/14   Mercedes Camprubi-Soms, PA-C  nitrofurantoin, macrocrystal-monohydrate, (MACROBID) 100 MG capsule Take 1 capsule (100 mg total) by mouth 2 (two) times daily. 02/01/15   Ambrose Finland, NP  omeprazole (PRILOSEC) 20 MG capsule Take 1 capsule (20 mg total) by mouth daily. 02/01/15   Ambrose Finland, NP  QUEtiapine (SEROQUEL) 200 MG tablet Take 200 mg by mouth.  08/23/14   Historical Provider, MD  traMADol (ULTRAM) 50 MG tablet Take 1 tablet (50 mg total) by mouth every 12 (twelve) hours as needed. 02/01/15   Ambrose Finland, NP   Triage Vitals: BP 115/61 mmHg  Pulse 116  Temp(Src) 98.5 F (36.9 C) (Oral)  Resp 16  Wt 160 lb (72.576 kg)  SpO2 100%   Physical Exam  Constitutional: She is oriented to person, place, and time. She appears well-developed and well-nourished.  HENT:  Head: Normocephalic and atraumatic.  Eyes: EOM are normal. Pupils are equal, round, and reactive to light.  Neck: Normal range of motion. Neck supple. No JVD present.  Cardiovascular: Normal rate, regular rhythm and normal heart sounds.   No murmur heard. Pulmonary/Chest: Effort normal and breath sounds normal. She has no wheezes. She has no rales. She exhibits tenderness.  Mild tenderness over mid and lower sternum   Abdominal: Soft. Bowel sounds are normal. She exhibits no distension and no mass. There is no tenderness.  Musculoskeletal: Normal range of motion. She exhibits no edema.  Lymphadenopathy:    She has no cervical adenopathy.  Neurological: She is alert and oriented to person, place, and time. No cranial nerve deficit. She exhibits normal muscle tone. Coordination normal.  Skin: Skin is warm and dry. No rash noted.  Psychiatric: She has a normal mood and affect. Her behavior is normal. Judgment and thought content normal.  Nursing note and vitals reviewed.   ED Course   Procedures (including critical care time)  DIAGNOSTIC STUDIES: Oxygen Saturation is 100% on RA, Normal by my interpretation.    COORDINATION OF CARE: 11:44 PM- Will order initial blood work, EKG, repeat troponin, and CXR. Will give Toradol and Zofran. Discussed treatment plan with pt at bedside and pt agreed to plan.    12:00 PM- Will refer pt for outpatient studies and for follow up with cardiology.   Labs Review Results for orders placed or performed during the hospital encounter of 07/03/15  Basic metabolic panel  Result Value Ref Range   Sodium 141 135 - 145 mmol/L   Potassium 3.7 3.5 - 5.1 mmol/L   Chloride 106 101 - 111 mmol/L   CO2 27 22 - 32 mmol/L   Glucose, Bld 112 (H) 65 - 99 mg/dL   BUN 5 (L) 6 - 20 mg/dL   Creatinine, Ser 4.09 0.44 - 1.00 mg/dL   Calcium 9.4 8.9 - 81.1 mg/dL   GFR calc non Af  Amer >60 >60 mL/min   GFR calc Af Amer >60 >60 mL/min   Anion gap 8 5 - 15  CBC  Result Value Ref Range   WBC 7.3 4.0 - 10.5 K/uL   RBC 4.25 3.87 - 5.11 MIL/uL   Hemoglobin 12.8 12.0 - 15.0 g/dL   HCT 16.1 09.6 - 04.5 %   MCV 93.4 78.0 - 100.0 fL   MCH 30.1 26.0 - 34.0 pg   MCHC 32.2 30.0 - 36.0 g/dL   RDW 40.9 81.1 - 91.4 %   Platelets 273 150 - 400 K/uL  Hepatic function panel  Result Value Ref Range   Total Protein 6.1 (L) 6.5 - 8.1 g/dL   Albumin 3.6 3.5 - 5.0 g/dL   AST 16 15 - 41 U/L   ALT 13 (L) 14 - 54 U/L   Alkaline Phosphatase 81 38 - 126 U/L   Total Bilirubin 0.3 0.3 - 1.2 mg/dL   Bilirubin, Direct <7.8 (L) 0.1 - 0.5 mg/dL   Indirect Bilirubin NOT CALCULATED 0.3 - 0.9 mg/dL  I-stat troponin, ED  Result Value Ref Range   Troponin i, poc 0.01 0.00 - 0.08 ng/mL   Comment 3          I-stat troponin, ED  Result Value Ref Range   Troponin i, poc 0.00 0.00 - 0.08 ng/mL   Comment 3           Imaging Review Dg Chest 2 View  07/03/2015  CLINICAL DATA:  Mid chest pain and shortness of breath beginning this morning. Initial encounter. EXAM: CHEST  2 VIEW  COMPARISON:  None. FINDINGS: The lungs are clear. Heart size is normal. No pneumothorax or pleural effusion. Cholecystectomy clips noted. No bony abnormality. IMPRESSION: Negative chest. Electronically Signed   By: Drusilla Kanner M.D.   On: 07/03/2015 18:06   I have personally reviewed and evaluated these images and lab results as part of my medical decision-making.   EKG Interpretation   Date/Time:  Tuesday July 03 2015 17:27:29 EDT Ventricular Rate:  61 PR Interval:  168 QRS Duration: 86 QT Interval:  400 QTC Calculation: 402 R Axis:   90 Text Interpretation:  Normal sinus rhythm Rightward axis Borderline ECG No  old tracing to compare Confirmed by [facility]  MD, Seth Friedlander ([id number]) on 07/03/2015  11:24:44 PM      MDM   Final diagnoses:  Musculoskeletal chest pain  Weight loss, non-intentional    Chest pain which appears to be musculoskeletal given the inability to reproduce it by palpation. She does have risk factors for cardiac disease of smoking and hyperlipidemia but has normal ECG and normal troponin 2. Chest pain was completely relieved with the dose of ketorolac. I am also concerned about her unexplained weight loss. Laboratory workup is unremarkable. She is discharged and referred to cardiology for stress test and [facility] loss. Old records were reviewed, but she has no relevant past visits.  I personally performed the services described in this documentation, which was scribed in my presence. The recorded information has been reviewed and is accurate.      Dione Booze, MD 07/04/15 [phone]

## 2015-07-03 NOTE — ED Notes (Signed)
Cp increases with palpation

## 2015-07-03 NOTE — ED Notes (Signed)
Dr. Glick at bedside at this time  

## 2015-07-04 LAB — HEPATIC FUNCTION PANEL
ALBUMIN: 3.6 g/dL (ref 3.5–5.0)
ALT: 13 U/L — AB (ref 14–54)
AST: 16 U/L (ref 15–41)
Alkaline Phosphatase: 81 U/L (ref 38–126)
Total Bilirubin: 0.3 mg/dL (ref 0.3–1.2)
Total Protein: 6.1 g/dL — ABNORMAL LOW (ref 6.5–8.1)

## 2015-07-04 LAB — I-STAT TROPONIN, ED: Troponin i, poc: 0 ng/mL (ref 0.00–0.08)

## 2015-07-04 NOTE — Discharge Instructions (Signed)
Take ibuprofen or naproxen as needed for pain. Make an appointment with the cardiologist to consider stress testing. Follow up with the [facility] to evaluate your weight loss.  Chest Wall Pain Chest wall pain is pain in or around the bones and muscles of your chest. Sometimes, an injury causes this pain. Sometimes, the cause may not be known. This pain may take several weeks or longer to get better. HOME CARE INSTRUCTIONS  Pay attention to any changes in your symptoms. Take these actions to help with your pain:   Rest as told by your health care provider.   Avoid activities that cause pain. These include any activities that use your chest muscles or your abdominal and side muscles to lift heavy items.   If directed, apply ice to the painful area:  Put ice in a plastic bag.  Place a towel between your skin and the bag.  Leave the ice on for 20 minutes, 2-3 times per day.  Take over-the-counter and prescription medicines only as told by your health care provider.  Do not use tobacco products, including cigarettes, chewing tobacco, and e-cigarettes. If you need help quitting, ask your health care provider.  Keep all follow-up visits as told by your health care provider. This is important. SEEK MEDICAL CARE IF:  You have a fever.  Your chest pain becomes worse.  You have new symptoms. SEEK IMMEDIATE MEDICAL CARE IF:  You have nausea or vomiting.  You feel sweaty or light-headed.  You have a cough with phlegm (sputum) or you cough up blood.  You develop shortness of breath.   This information is not intended to replace advice given to you by your health care provider. Make sure you discuss any questions you have with your health care provider.   Document Released: 03/03/2005 Document Revised: 11/22/2014 Document Reviewed: 05/29/2014 Elsevier Interactive Patient Education [employer]2016 Elsevier Inc.

## 2015-07-06 DIAGNOSIS — S8265XD Nondisplaced fracture of lateral malleolus of left fibula, subsequent encounter for closed fracture with routine healing: Secondary | ICD-10-CM | POA: Diagnosis not present

## 2015-07-09 ENCOUNTER — Other Ambulatory Visit: Payer: Self-pay | Admitting: Internal Medicine

## 2015-07-09 NOTE — Telephone Encounter (Signed)
Misty StanleyStacey [facility] called stating that the patient needs  A [employer] referral. Please follow up.

## 2015-07-09 NOTE — Telephone Encounter (Signed)
Done  Kennyth ArnoldStacy from [facility] aware of that  [employer] You

## 2015-07-25 ENCOUNTER — Ambulatory Visit: Payer: Medicare Other | Admitting: Internal Medicine

## 2015-07-25 NOTE — Telephone Encounter (Signed)
Pt. Called stating that she woke up late and missed her appt. At 11. Pt. Needs refills on levothyroxine (SYNTHROID, LEVOTHROID) 112 MCG, and was told that she could not have refill until she made an appointment. Pt. Would like to speak to the nurse. Please f/u with pt.

## 2015-07-26 MED ORDER — LEVOTHYROXINE SODIUM 112 MCG PO TABS: 112.0000 ug | ORAL_TABLET | Freq: Every day | ORAL | Status: DC

## 2015-07-26 MED FILL — LEVOTHYROXINE 112 MCG TAB: 112 | 30 days supply | Qty: 30 | Fill #0

## 2015-07-26 NOTE — Telephone Encounter (Signed)
Pt. Called stating that she woke up late and missed her appt. At 11. Pt. Needs refills on levothyroxine (SYNTHROID, LEVOTHROID) 112 MCG, and was told that she could not have refill until she made an appointment. Pt. Would like to speak to the nurse. Please f/u with pt.  °

## 2015-07-26 NOTE — Telephone Encounter (Signed)
Refilled medication x 1 month. Patient needs to be established with new provider here since [name] is gone.

## 2015-08-01 MED FILL — OMEPRAZOLE DR 20 MG CAPSULE: 20 | 28 days supply | Qty: 28 | Fill #4

## 2015-08-01 MED FILL — ATORVASTATIN 40 MG TABLET: 40 | 30 days supply | Qty: 30 | Fill #4

## 2015-08-09 DIAGNOSIS — F3132 Bipolar disorder, current episode depressed, moderate: Secondary | ICD-10-CM | POA: Diagnosis not present

## 2015-09-11 ENCOUNTER — Ambulatory Visit: Payer: Commercial Managed Care - HMO | Attending: Internal Medicine | Admitting: Internal Medicine

## 2015-09-11 ENCOUNTER — Encounter: Payer: Self-pay | Admitting: Internal Medicine

## 2015-09-11 VITALS — BP 102/69 | HR 69 | Temp 98.2°F | Wt 158.6 lb

## 2015-09-11 DIAGNOSIS — Z0000 Encounter for general adult medical examination without abnormal findings: Secondary | ICD-10-CM | POA: Diagnosis not present

## 2015-09-11 DIAGNOSIS — F319 Bipolar disorder, unspecified: Secondary | ICD-10-CM | POA: Diagnosis not present

## 2015-09-11 DIAGNOSIS — Z1211 Encounter for screening for malignant neoplasm of colon: Secondary | ICD-10-CM | POA: Diagnosis not present

## 2015-09-11 DIAGNOSIS — E039 Hypothyroidism, unspecified: Secondary | ICD-10-CM | POA: Diagnosis not present

## 2015-09-11 DIAGNOSIS — Z1239 Encounter for other screening for malignant neoplasm of breast: Secondary | ICD-10-CM | POA: Diagnosis not present

## 2015-09-11 DIAGNOSIS — E785 Hyperlipidemia, unspecified: Secondary | ICD-10-CM | POA: Insufficient documentation

## 2015-09-11 DIAGNOSIS — Z79899 Other long term (current) drug therapy: Secondary | ICD-10-CM | POA: Insufficient documentation

## 2015-09-11 DIAGNOSIS — S93401A Sprain of unspecified ligament of right ankle, initial encounter: Secondary | ICD-10-CM | POA: Diagnosis not present

## 2015-09-11 LAB — LIPID PANEL
CHOL/HDL RATIO: 2.3 ratio (ref <=5.0)
CHOLESTEROL: 187 mg/dL (ref 125–200)
HDL: 83 mg/dL (ref >=46)
LDL Cholesterol: 91 mg/dL (ref <130)
Triglycerides: 65 mg/dL (ref <150)
VLDL: 13 mg/dL (ref <30)

## 2015-09-11 LAB — TSH: TSH: 1.61 m[IU]/L

## 2015-09-11 MED ORDER — NICOTINE 14 MG/24HR TD PT24: 14.0000 mg | MEDICATED_PATCH | Freq: Every day | TRANSDERMAL | Status: DC

## 2015-09-11 NOTE — Progress Notes (Signed)
Rachel Everett, is a 55 y.o. female  OZH:[id number]  GEX:[id number]  DOB - 02/26/1961  CC:  Chief Complaint  Patient presents with  . Establish Care    Re est Care  . Medication Refill       HPI: Rachel Everett is a 54 y.o. female here today to establish medical care, w/ signif pmhx of hypothyroidism, hld, bipolar d/o, sees [facility] psychiatry for her rx, and tob abuse.  She states she broke her right ankle about 25 years ago, and than rebroke the right tip of tibia last year, requiring eval by orthopedics and foot cast.  Since than, she notes that the ankle is very unstable and frequently twists it.   Denies current sob/cp. tob 1/2 ppd, her dgt lives w/ her and they both smoke. She wants to quit. But has no $ for nicoderm patches.  She has never had colonoscopy b/c of copay last year.  She does have depression, but stable,  No si/hi/avh.  Patient has No headache, No chest pain, no orthopnea, No abdominal pain - No Nausea, No new weakness tingling or numbness, No Cough - SOB.  She was seen in ED 07/03/15 for chest pain, but workup negative in Ed, felt MSK pain.  Pt was referred to cardiology at time, but could not afford the copay.    Review of Systems:   No Known Allergies Past Medical History  Diagnosis Date  . Bipolar 1 disorder (HCC)   . Thyroid disease   . Hyperlipidemia    Current Outpatient Prescriptions on File Prior to Visit  Medication Sig Dispense Refill  . atorvastatin (LIPITOR) 40 MG tablet Take 1 tablet (40 mg total) by mouth daily. 30 tablet 4  . hydrOXYzine (ATARAX/VISTARIL) 50 MG tablet Take 50 mg by mouth 3 (three) times daily as needed for anxiety.     . lamoTRIgine (LAMICTAL) 200 MG tablet Take 200 mg by mouth daily.   0  . levothyroxine (SYNTHROID, LEVOTHROID) 112 MCG tablet Take 1 tablet (112 mcg total) by mouth daily. Needs office visits for refills 30 tablet 0  . lithium carbonate (LITHOBID) 300 MG CR tablet Take 300 mg by mouth daily.   0  . QUEtiapine  (SEROQUEL) 200 MG tablet Take 200 mg by mouth daily.   0   No current facility-administered medications on file prior to visit.   Family History  Problem Relation Age of Onset  . Cancer Mother   . Cancer Maternal Grandmother    Social History   Social History  . Marital Status: Single    Spouse Name: N/A  . Number of Children: N/A  . Years of Education: N/A   Occupational History  . Not on file.   Social History Main Topics  . Smoking status: Never Smoker   . Smokeless tobacco: Not on file  . Alcohol Use: Yes     Comment: RARELY  . Drug Use: No  . Sexual Activity: Not on file   Other Topics Concern  . Not on file   Social History Narrative    Objective:   Filed Vitals:   09/11/15 1121  BP: 102/69  Pulse: 69  Temp: 98.2 F (36.8 C)    Filed Weights   09/11/15 1121  Weight: 158 lb 9.6 oz (71.94 kg)    BP Readings from Last 3 Encounters:  09/11/15 102/69  07/04/15 117/75  02/01/15 116/73    Physical Exam: Constitutional: Patient appears well-developed and well-nourished. No distress. AAOx3, pleasant HENT: Normocephalic, atraumatic,  External right and left ear normal. Oropharynx is clear and moist.  Eyes: Conjunctivae and EOM are normal. PERRL, no scleral icterus. Neck: Normal ROM. Neck supple. No JVD. CVS: RRR, S1/S2 +, no murmurs, no gallops, no carotid bruit.  Pulmonary: Effort and breath sounds normal, no stridor, rhonchi, wheezes, rales.  Abdominal: Soft. BS +, no distension, tenderness, rebound or guarding.  Musculoskeletal: Normal range of motion. No edema and no tenderness.  LE: bilat/ no c/c/e, pulses 2+ bilateral. Right ankle mildly swollen, but nttp all joints, no echymosis/bruising, ankle is stiff on rotation.  Neuro: Alert.  muscle tone coordination wnl. No cranial nerve deficit grossly. Skin: Skin is warm and dry. No rash noted. Not diaphoretic. No erythema. No pallor. Psychiatric: Normal mood and affect. Behavior, judgment, thought  content normal.  Lab Results  Component Value Date   WBC 7.3 07/03/2015   HGB 12.8 07/03/2015   HCT 39.7 07/03/2015   MCV 93.4 07/03/2015   PLT 273 07/03/2015   Lab Results  Component Value Date   CREATININE 0.92 07/03/2015   BUN 5* 07/03/2015   NA 141 07/03/2015   K 3.7 07/03/2015   CL 106 07/03/2015   CO2 27 07/03/2015    No results found for: HGBA1C Lipid Panel     Component Value Date/Time   CHOL 127 09/07/2014 1135   TRIG 94 09/07/2014 1135   HDL 60 09/07/2014 1135   CHOLHDL 2.1 09/07/2014 1135   VLDL 19 09/07/2014 1135   LDLCALC 48 09/07/2014 1135       Depression screen PHQ 2/9 09/11/2015 09/07/2014  Decreased Interest 2 3  Down, Depressed, Hopeless 2 3  PHQ - 2 Score 4 6  Altered sleeping 3 1  Tired, decreased energy 1 1  Change in appetite 2 3  Feeling bad or failure about yourself  3 3  Trouble concentrating 1 1  Moving slowly or fidgety/restless 0 1  Suicidal thoughts 0 0  PHQ-9 Score 14 16  Difficult doing work/chores Extremely dIfficult -    Assessment and plan:   1. Hypothyroidism, unspecified hypothyroidism type Continue synthroid until tsh comes back, may need to adjust - TSH  2. Right ankle sprain, initial encounter - recd RICE, use ankle brace (she has many at home), and also to not wear clogs, better fitting shoes to give extra ankle stability.  3. Encounter for screening colonoscopy - Ambulatory referral to Gastroenterology  4. Breast cancer screening - MM Digital Screening; Future  5. Annual physical exam - Lipid Panel - Vitamin D, 25-hydroxy - due for Pap smear - set up appt when able.   6. Bipolar /depression - continue to see [facility] therapy. - no si/hi/avh today.  Return in about 4 weeks (around 10/09/2015) for pap when able.  The patient was given clear instructions to go to ER or return to medical center if symptoms don't improve, worsen or new problems develop. The patient verbalized understanding. The patient was  told to call to get lab results if they haven't heard anything in the next week.    This note has been created with Education officer, environmental. Any transcriptional errors are unintentional.   [name], MD, MBA/MHA [facility] [place], [place] [phone]   09/11/2015, 11:50 AM

## 2015-09-11 NOTE — Patient Instructions (Addendum)
Ankle Sprain An ankle sprain is an injury to the strong, fibrous tissues (ligaments) that hold your ankle bones together.  HOME CARE   Put ice on your ankle for 1-2 days or as told by your doctor.  Put ice in a plastic bag.  Place a towel between your skin and the bag.  Leave the ice on for 15-20 minutes at a time, every 2 hours while you are awake.  Only take medicine as told by your doctor.  Raise (elevate) your injured ankle above the level of your heart as much as possible for 2-3 days.  Use crutches if your doctor tells you to. Slowly put your own weight on the affected ankle. Use the crutches until you can walk without pain.  If you have a plaster splint:  Do not rest it on anything harder than a pillow for 24 hours.  Do not put weight on it.  Do not get it wet.  Take it off to shower or bathe.  If given, use an elastic wrap or support stocking for support. Take the wrap off if your toes lose feeling (numb), tingle, or turn cold or blue.  If you have an air splint:  Add or let out air to make it comfortable.  Take it off at night and to shower and bathe.  Wiggle your toes and move your ankle up and down often while you are wearing it. GET HELP IF:  You have rapidly increasing bruising or puffiness (swelling).  Your toes feel very cold.  You lose feeling in your foot.  Your medicine does not help your pain. GET HELP RIGHT AWAY IF:   Your toes lose feeling (numb) or turn blue.  You have severe pain that is increasing. MAKE SURE YOU:   Understand these instructions.  Will watch your condition.  Will get help right away if you are not doing well or get worse.   This information is not intended to replace advice given to you by your health care provider. Make sure you discuss any questions you have with your health care provider.   Document Released: 08/20/2007 Document Revised: 03/24/2014 Document Reviewed: 09/15/2011 Elsevier Interactive Patient  Education 2016 [employer]  - You Can Quit Smoking If you are ready to quit smoking or are thinking about it, congratulations! You have chosen to help yourself be healthier and live longer! There are lots of different ways to quit smoking. Nicotine gum, nicotine patches, a nicotine inhaler, or nicotine nasal spray can help with physical craving. Hypnosis, support groups, and medicines help break the habit of smoking. TIPS TO GET OFF AND STAY OFF CIGARETTES  Learn to predict your moods. Do not let a bad situation be your excuse to have a cigarette. Some situations in your life might tempt you to have a cigarette.  Ask friends and co-workers not to smoke around you.  Make your home smoke-free.  Never have "just one" cigarette. It leads to wanting another and another. Remind yourself of your decision to quit.  On a card, make a list of your reasons for not smoking. Read it at least the same number of times a day as you have a cigarette. Tell yourself everyday, "I do not want to smoke. I choose not to smoke."  Ask someone at home or work to help you with your plan to quit smoking.  Have something planned after you eat or have a cup of coffee. Take a walk or get other exercise to perk you  up. This will help to keep you from overeating.  Try a relaxation exercise to calm you down and decrease your stress. Remember, you may be tense and nervous the first two weeks after you quit. This will pass.  Find new activities to keep your hands busy. Play with a pen, coin, or rubber band. Doodle or draw things on paper.  Brush your teeth right after eating. This will help cut down the craving for the taste of tobacco after meals. You can try mouthwash too.  Try gum, breath mints, or diet candy to keep something in your mouth. IF YOU SMOKE AND WANT TO QUIT:  Do not stock up on cigarettes. Never buy a carton. Wait until one pack is finished before you buy another.  Never carry cigarettes with you at  work or at home.  Keep cigarettes as far away from you as possible. Leave them with someone else.  Never carry matches or a lighter with you.  Ask yourself, "Do I need this cigarette or is this just a reflex?"  Bet with someone that you can quit. Put cigarette money in a piggy bank every morning. If you smoke, you give up the money. If you do not smoke, by the end of the week, you keep the money.  Keep trying. It takes 21 days to change a habit!  Talk to your doctor about using medicines to help you quit. These include nicotine replacement gum, lozenges, or skin patches.   This information is not intended to replace advice given to you by your health care provider. Make sure you discuss any questions you have with your health care provider.   Document Released: 12/28/2008 Document Revised: 05/26/2011 Document Reviewed: 12/28/2008 Elsevier Interactive Patient Education [employer] Elsevier Inc.

## 2015-09-12 ENCOUNTER — Other Ambulatory Visit: Payer: Self-pay | Admitting: Internal Medicine

## 2015-09-12 DIAGNOSIS — Z1231 Encounter for screening mammogram for malignant neoplasm of breast: Secondary | ICD-10-CM

## 2015-09-12 DIAGNOSIS — E785 Hyperlipidemia, unspecified: Secondary | ICD-10-CM

## 2015-09-12 LAB — VITAMIN D 25 HYDROXY (VIT D DEFICIENCY, FRACTURES): Vit D, 25-Hydroxy: 15 ng/mL — ABNORMAL LOW (ref 30–100)

## 2015-09-12 MED ORDER — LEVOTHYROXINE SODIUM 112 MCG PO TABS: 112.0000 ug | ORAL_TABLET | Freq: Every day | ORAL | Status: DC

## 2015-09-12 MED ORDER — VITAMIN D (ERGOCALCIFEROL) 1.25 MG (50000 UNIT) PO CAPS: 50000.0000 [IU] | ORAL_CAPSULE | ORAL | Status: DC

## 2015-09-19 ENCOUNTER — Ambulatory Visit
Admission: RE | Admit: 2015-09-19 | Discharge: 2015-09-19 | Disposition: A | Discharge status: Home / Self Care | Payer: Commercial Managed Care - HMO | Source: Ambulatory Visit | Attending: Internal Medicine | Admitting: Internal Medicine

## 2015-09-19 ENCOUNTER — Other Ambulatory Visit: Payer: Self-pay | Admitting: Internal Medicine

## 2015-09-19 DIAGNOSIS — Z1231 Encounter for screening mammogram for malignant neoplasm of breast: Secondary | ICD-10-CM | POA: Diagnosis not present

## 2015-09-19 MED FILL — LEVOTHYROXINE 112 MCG TAB: 112 | 90 days supply | Qty: 90 | Fill #0

## 2015-09-19 MED FILL — VIT D2 1.25 MG (50,000 UNIT: 1.25 MG | 84 days supply | Qty: 12 | Fill #0

## 2015-09-19 NOTE — Telephone Encounter (Signed)
Patients cholesterol was checked on 09/11/15 and patient cholesterol was all in range. Patient advised of the results at this time and informed of a message being sent to the PCP for clarification regarding cholesterol medication refill.

## 2015-09-20 MED ORDER — PRAVASTATIN SODIUM 20 MG PO TABS: 20.0000 mg | ORAL_TABLET | Freq: Every day | ORAL | Status: DC

## 2015-09-20 MED FILL — PRAVASTATIN NA 20 MG TAB: 20 | 30 days supply | Qty: 30 | Fill #0

## 2015-09-20 NOTE — Addendum Note (Signed)
Addended byDierdre Searles: Hardy Harcum T on: 09/20/2015 12:20 PM   Modules accepted: Orders, Medications

## 2015-09-20 NOTE — Telephone Encounter (Signed)
I decreased the dose and strength of cholesterol med for now, hx of tob use.   New rx pravastatin 20 qhs.  Will rechk levels in 6 months or so and reeval. Thanks.

## 2015-09-21 ENCOUNTER — Encounter: Payer: Self-pay | Admitting: Internal Medicine

## 2015-09-24 ENCOUNTER — Telehealth: Payer: Self-pay | Admitting: *Deleted

## 2015-09-24 NOTE — Telephone Encounter (Signed)
Patient called and rescheduled PV for 09-25-15.

## 2015-09-24 NOTE — Telephone Encounter (Signed)
No show PV today. Called patient, no answer. Left message for patient to call us back to reschedule nurse visit.

## 2015-09-25 ENCOUNTER — Ambulatory Visit (AMBULATORY_SURGERY_CENTER): Payer: Self-pay

## 2015-09-25 VITALS — Ht 66.0 in | Wt 161.0 lb

## 2015-09-25 DIAGNOSIS — Z1211 Encounter for screening for malignant neoplasm of colon: Secondary | ICD-10-CM

## 2015-09-25 MED ORDER — NA SULFATE-K SULFATE-MG SULF 17.5-3.13-1.6 GM/177ML PO SOLN: 1.0000 | Freq: Once | ORAL | Status: DC

## 2015-09-25 MED FILL — SUPREP BOWEL PREP KIT: 17.5-3.13-1 | 1 days supply | Qty: 354 | Fill #0

## 2015-09-25 NOTE — Progress Notes (Signed)
No egg or soy allergy.  No previous complications from anesthesia. No home O2. No diet meds. Emmi video sent to [email or web address]

## 2015-09-26 ENCOUNTER — Telehealth: Payer: Self-pay | Admitting: Internal Medicine

## 2015-09-26 NOTE — Telephone Encounter (Signed)
Pt called requesting to speak to PCP regarding medication Liptor pt was taking it before and does not know if she should still continue taking it. Patient is also requesting change of med for omeprazole (PRILOSEC) 20 MG capsule pt states there is a study about medication and does not want to take it . Please f/up

## 2015-09-28 ENCOUNTER — Encounter: Payer: Self-pay | Admitting: Internal Medicine

## 2015-09-28 MED FILL — OMEPRAZOLE DR 20 MG CAPSULE: 20 | 28 days supply | Qty: 28 | Fill #5

## 2015-10-04 ENCOUNTER — Encounter: Payer: Commercial Managed Care - HMO | Admitting: Internal Medicine

## 2015-10-04 ENCOUNTER — Telehealth: Payer: Self-pay | Admitting: *Deleted

## 2015-10-04 ENCOUNTER — Telehealth: Payer: Self-pay | Admitting: Gastroenterology

## 2015-10-04 NOTE — Telephone Encounter (Signed)
Rachel Everett FYI, forwarding to you, I think this patient is on your schedule today

## 2015-10-04 NOTE — Telephone Encounter (Signed)
Phoned patient regarding phone call with Dr. Dorris CarnesN last evening and no bm. She states she followed Dr. Dorris CarnesN advice and has completed both suprep bottles and the miralax in 64 oz of gatorade and still has not had a bowel movement. Per Dr. Rhea BeltonPyrtle, cancel procedure, bring in to previsit for 2 day prep instructions for colon now scheduled 10/16/15

## 2015-10-04 NOTE — Telephone Encounter (Signed)
Patient called around 11:30-12 that she finished the first bottle of kit and didn't have any BM, per instructions she was asked to contact oncall md by 9 or 10 pm if no BM. Advised patient to additional miralax 1 bottle mixed in 64oz gatorade and complete the 2nd bottle of suprep split dose as per instructions

## 2015-10-05 ENCOUNTER — Ambulatory Visit (AMBULATORY_SURGERY_CENTER): Payer: Self-pay | Admitting: *Deleted

## 2015-10-05 ENCOUNTER — Encounter: Payer: Self-pay | Admitting: *Deleted

## 2015-10-05 VITALS — Ht 64.0 in | Wt 163.2 lb

## 2015-10-05 DIAGNOSIS — Z1211 Encounter for screening for malignant neoplasm of colon: Secondary | ICD-10-CM

## 2015-10-05 MED ORDER — SUPREP BOWEL PREP KIT 17.5-3.13-1.6 GM/177ML PO SOLN: 1.0000 | Freq: Once | ORAL | Status: DC

## 2015-10-05 NOTE — Progress Notes (Signed)
Patient denies any allergies to egg or soy products. Patient denies complications with anesthesia/sedation.  Patient denies oxygen use at home and denies diet medications. Emmi instructions for colonoscopy explained but patient denied.  Pamphlet given.  

## 2015-10-10 ENCOUNTER — Other Ambulatory Visit: Payer: Medicare Other | Admitting: Internal Medicine

## 2015-10-12 ENCOUNTER — Telehealth: Payer: Self-pay

## 2015-10-12 MED ORDER — FAMOTIDINE 20 MG PO TABS: 20.0000 mg | ORAL_TABLET | Freq: Two times a day (BID) | ORAL | 3 refills | Status: DC

## 2015-10-12 NOTE — Telephone Encounter (Signed)
Contacted pt lvm for pt to give me a call at her earliest convenience

## 2015-10-12 NOTE — Telephone Encounter (Signed)
Contacted pt to go over lab results pt didn't answer lvm for pt to give me a call back at her earliest convenience  

## 2015-10-12 NOTE — Telephone Encounter (Signed)
Please call. Her cholesterol looks good now. Try to continue to limit saturated fat to no more than 7% of your calories, limit cholesterol to 200 mg/day, increase fiber and exercise as tolerated. We can check her cholesterol again next year, if bad we may need to restart.  I changed her prilosec to pepcid 20mg  bid, she can also use tums prn if its just mild heartburn rather than starting the pepcid and watch her diet for GERD foods, coffee, caffeine, chocolate, tobacco, eating late at night, and sleeping with a couple of pillows behind back at night to prevent gerd. Thanks.

## 2015-10-16 ENCOUNTER — Encounter: Payer: Commercial Managed Care - HMO | Admitting: Internal Medicine

## 2015-10-16 NOTE — Telephone Encounter (Signed)
Sent letter to patient asking her to give [place] a call at her earliest convenience

## 2015-11-12 MED FILL — PRAVASTATIN NA 20 MG TAB: 20 | 30 days supply | Qty: 30 | Fill #1

## 2015-11-12 MED FILL — OMEPRAZOLE DR 20 MG CAPSULE: 20 | 8 days supply | Qty: 8 | Fill #6

## 2015-11-20 DIAGNOSIS — F3132 Bipolar disorder, current episode depressed, moderate: Secondary | ICD-10-CM | POA: Diagnosis not present

## 2015-12-20 MED FILL — FAMOTIDINE 20 MG TABLET: 20 | 30 days supply | Qty: 60 | Fill #0

## 2015-12-25 ENCOUNTER — Other Ambulatory Visit: Payer: Self-pay | Admitting: Internal Medicine

## 2015-12-25 DIAGNOSIS — K219 Gastro-esophageal reflux disease without esophagitis: Secondary | ICD-10-CM

## 2015-12-25 MED FILL — LEVOTHYROXINE 112 MCG TAB: 112 | 90 days supply | Qty: 90 | Fill #1

## 2015-12-25 MED FILL — OMEPRAZOLE DR 20 MG CAPSULE: 20 | 30 days supply | Qty: 30 | Fill #0

## 2015-12-25 MED FILL — PRAVASTATIN NA 20 MG TAB: 20 | 30 days supply | Qty: 30 | Fill #2

## 2015-12-25 NOTE — Telephone Encounter (Signed)
Please call, is she taking pepcid and its not enough w/ periodic tums? I renewed her prilosec, but recd only short term use 2-4 wks,. Need to watch diet.   gerd diet foods to avoid/ caffeine, chocolate, acid foods such as ketchup , tomato sauce, cigarretes. thx

## 2015-12-25 NOTE — Telephone Encounter (Signed)
Pt is aware of med rx fefills

## 2016-01-23 DIAGNOSIS — F3132 Bipolar disorder, current episode depressed, moderate: Secondary | ICD-10-CM | POA: Diagnosis not present

## 2016-01-31 MED FILL — PRAVASTATIN NA 20 MG TAB: 20 | 90 days supply | Qty: 90 | Fill #3

## 2016-03-24 MED FILL — FAMOTIDINE 20 MG TABLET: 20 | 30 days supply | Qty: 60 | Fill #1

## 2016-03-24 MED FILL — LEVOTHYROXINE 112 MCG TAB: 112 | 90 days supply | Qty: 90 | Fill #2

## 2016-04-28 DIAGNOSIS — F3132 Bipolar disorder, current episode depressed, moderate: Secondary | ICD-10-CM | POA: Diagnosis not present

## 2016-05-13 DIAGNOSIS — F3132 Bipolar disorder, current episode depressed, moderate: Secondary | ICD-10-CM | POA: Diagnosis not present

## 2016-05-13 DIAGNOSIS — Z79899 Other long term (current) drug therapy: Secondary | ICD-10-CM | POA: Diagnosis not present

## 2016-05-14 ENCOUNTER — Encounter: Payer: Self-pay | Admitting: Internal Medicine

## 2016-05-14 ENCOUNTER — Ambulatory Visit: Payer: Commercial Managed Care - HMO | Attending: Internal Medicine | Admitting: Internal Medicine

## 2016-05-14 VITALS — BP 108/74 | HR 68 | Temp 98.4°F | Resp 16 | Wt 167.4 lb

## 2016-05-14 DIAGNOSIS — E039 Hypothyroidism, unspecified: Secondary | ICD-10-CM | POA: Insufficient documentation

## 2016-05-14 DIAGNOSIS — E559 Vitamin D deficiency, unspecified: Secondary | ICD-10-CM | POA: Diagnosis not present

## 2016-05-14 DIAGNOSIS — M26622 Arthralgia of left temporomandibular joint: Secondary | ICD-10-CM

## 2016-05-14 DIAGNOSIS — F3132 Bipolar disorder, current episode depressed, moderate: Secondary | ICD-10-CM | POA: Diagnosis not present

## 2016-05-14 DIAGNOSIS — F3162 Bipolar disorder, current episode mixed, moderate: Secondary | ICD-10-CM

## 2016-05-14 DIAGNOSIS — Z114 Encounter for screening for human immunodeficiency virus [HIV]: Secondary | ICD-10-CM | POA: Insufficient documentation

## 2016-05-14 DIAGNOSIS — Z1159 Encounter for screening for other viral diseases: Secondary | ICD-10-CM | POA: Insufficient documentation

## 2016-05-14 DIAGNOSIS — Z131 Encounter for screening for diabetes mellitus: Secondary | ICD-10-CM | POA: Insufficient documentation

## 2016-05-14 DIAGNOSIS — M26621 Arthralgia of right temporomandibular joint: Secondary | ICD-10-CM | POA: Diagnosis not present

## 2016-05-14 DIAGNOSIS — K219 Gastro-esophageal reflux disease without esophagitis: Secondary | ICD-10-CM | POA: Insufficient documentation

## 2016-05-14 DIAGNOSIS — Z1211 Encounter for screening for malignant neoplasm of colon: Secondary | ICD-10-CM

## 2016-05-14 DIAGNOSIS — Z23 Encounter for immunization: Secondary | ICD-10-CM

## 2016-05-14 DIAGNOSIS — F319 Bipolar disorder, unspecified: Secondary | ICD-10-CM | POA: Insufficient documentation

## 2016-05-14 DIAGNOSIS — E785 Hyperlipidemia, unspecified: Secondary | ICD-10-CM | POA: Insufficient documentation

## 2016-05-14 DIAGNOSIS — R6884 Jaw pain: Secondary | ICD-10-CM | POA: Diagnosis present

## 2016-05-14 LAB — CBC WITH DIFFERENTIAL/PLATELET
Basophils Absolute: 99 cells/uL (ref 0–200)
Basophils Relative: 1 %
EOS ABS: 396 {cells}/uL (ref 15–500)
EOS PCT: 4 %
HCT: 40.8 % (ref 35.0–45.0)
HEMOGLOBIN: 13.2 g/dL (ref 11.7–15.5)
LYMPHS ABS: 2376 {cells}/uL (ref 850–3900)
Lymphocytes Relative: 24 %
MCH: 30.3 pg (ref 27.0–33.0)
MCHC: 32.4 g/dL (ref 32.0–36.0)
MCV: 93.6 fL (ref 80.0–100.0)
MPV: 10.1 fL (ref 7.5–12.5)
Monocytes Absolute: 693 cells/uL (ref 200–950)
Monocytes Relative: 7 %
NEUTROS PCT: 64 %
Neutro Abs: 6336 cells/uL (ref 1500–7800)
Platelets: 327 10*3/uL (ref 140–400)
RBC: 4.36 MIL/uL (ref 3.80–5.10)
RDW: 13.6 % (ref 11.0–15.0)
WBC: 9.9 10*3/uL (ref 3.8–10.8)

## 2016-05-14 LAB — CMP AND LIVER
ALBUMIN: 4.2 g/dL (ref 3.6–5.1)
ALK PHOS: 87 U/L (ref 33–130)
ALT: 13 U/L (ref 6–29)
AST: 16 U/L (ref 10–35)
BILIRUBIN DIRECT: 0 mg/dL (ref <=0.2)
BILIRUBIN INDIRECT: 0.2 mg/dL (ref 0.2–1.2)
BILIRUBIN TOTAL: 0.2 mg/dL (ref 0.2–1.2)
BUN: 10 mg/dL (ref 7–25)
CO2: 25 mmol/L (ref 20–31)
Calcium: 9.5 mg/dL (ref 8.6–10.4)
Chloride: 106 mmol/L (ref 98–110)
Creat: 1.02 mg/dL (ref 0.50–1.05)
Glucose, Bld: 94 mg/dL (ref 65–99)
POTASSIUM: 4 mmol/L (ref 3.5–5.3)
SODIUM: 139 mmol/L (ref 135–146)
Total Protein: 6.7 g/dL (ref 6.1–8.1)

## 2016-05-14 LAB — TSH: TSH: 12.24 m[IU]/L — AB

## 2016-05-14 LAB — LIPID PANEL
CHOL/HDL RATIO: 3.5 ratio (ref <5.0)
CHOLESTEROL: 230 mg/dL — AB (ref <200)
HDL: 65 mg/dL (ref >50)
LDL Cholesterol: 147 mg/dL — ABNORMAL HIGH (ref <100)
TRIGLYCERIDES: 89 mg/dL (ref <150)
VLDL: 18 mg/dL (ref <30)

## 2016-05-14 LAB — T4, FREE: FREE T4: 0.9 ng/dL (ref 0.8–1.8)

## 2016-05-14 LAB — POCT GLYCOSYLATED HEMOGLOBIN (HGB A1C): Hemoglobin A1C: 5

## 2016-05-14 MED ORDER — CALCIUM 600 MG PO TABS: 600.0000 mg | ORAL_TABLET | Freq: Two times a day (BID) | ORAL | 3 refills | Status: DC

## 2016-05-14 NOTE — Progress Notes (Signed)
Rachel Everett, is a 56 y.o. female  YQI:[id number]  LOV:[id number]  DOB - 14-Aug-1960  Chief Complaint  Patient presents with  . Jaw Pain        Subjective:   Rachel Everett is a 56 y.o. female here today for a follow up visit for hypothyroidism. Pt states that she fell down stairs and hit her jaw about 30 years ago on concrete, since than her left jaw has never been the same. Co of hearing clicking noises when she chews on the left, very loud, and sometimes her jaw "gets stuck", which causes pain. Denies teeth grinding.  Very busy/stessed out recently. Has appt w/ [facility] next few day for her bipolar. Her therapist is considering increasing her lithium. She is also on lamictal, and takes prn atarix and seroquel prn sparingly due to sedation.   Patient has No headache, No chest pain, No abdominal pain - No Nausea, No new weakness tingling or numbness, No Cough - SOB.  Problem  Vitamin D Deficiency    ALLERGIES: No Known Allergies  PAST MEDICAL HISTORY: Past Medical History:  Diagnosis Date  . Anxiety   . Bipolar 1 disorder (HCC)   . Depression   . GERD (gastroesophageal reflux disease)   . Hyperlipidemia   . SVD (spontaneous vaginal delivery)    x 1  . Thyroid disease     MEDICATIONS AT HOME: Prior to Admission medications   Medication Sig Start Date End Date Taking? Authorizing Provider  pravastatin (PRAVACHOL) 20 MG tablet Take 20 mg by mouth daily.   Yes Historical Provider, MD  bisacodyl (DULCOLAX) 5 MG EC tablet Take 5 mg by mouth daily as needed for moderate constipation. Dulcolax 5 mg tab take as directed for colonoscopy prep.    Historical Provider, MD  calcium carbonate (OS-CAL) 600 MG tablet Take 1 tablet (600 mg total) by mouth 2 (two) times daily with a meal. 05/14/16   [name], MD  famotidine (PEPCID) 20 MG tablet Take 1 tablet (20 mg total) by mouth 2 (two) times daily. 10/12/15 10/11/16  [name], MD  hydrOXYzine (ATARAX/VISTARIL) 50 MG  tablet Take 50 mg by mouth 3 (three) times daily as needed for anxiety.     Historical Provider, MD  lamoTRIgine (LAMICTAL) 200 MG tablet Take 300 mg by mouth daily.  08/20/14   Historical Provider, MD  levothyroxine (SYNTHROID, LEVOTHROID) 112 MCG tablet Take 1 tablet (112 mcg total) by mouth daily. Needs office visits for refills 09/12/15   [name], MD  lithium carbonate (LITHOBID) 300 MG CR tablet Take 300 mg by mouth 3 (three) times daily.  06/27/14   Historical Provider, MD  NAPROXEN PO Take 300 mg by mouth.    Historical Provider, MD  polyethylene glycol powder (GLYCOLAX/MIRALAX) powder Take 1 Container by mouth once. Miralax 238 grams as directed for colonoscopy prep.    Historical Provider, MD  QUEtiapine (SEROQUEL) 200 MG tablet Take 200 mg by mouth as needed.  08/23/14   Historical Provider, MD  SUPREP BOWEL PREP KIT 17.5-3.13-1.6 GM/180ML SOLN Take 1 kit by mouth once. Brand Name Only.  No Substitutions.  Suprep Bowel Kit. Patient not taking: Reported on 05/14/2016 10/05/15   [name], MD  Vitamin D, Ergocalciferol, (DRISDOL) 50000 units CAPS capsule Take 1 capsule (50,000 Units total) by mouth every 7 (seven) days. Patient not taking: Reported on 05/14/2016 09/12/15   [name], MD     Objective:   Vitals:   05/14/16  1008  BP: 108/74  Pulse: 68  Resp: 16  Temp: 98.4 F (36.9 C)  TempSrc: Oral  SpO2: 100%  Weight: 167 lb 6.4 oz (75.9 kg)    Exam General appearance : Awake, alert, not in any distress. Speech Clear. Not toxic looking, pleasant. HEENT: Atraumatic and Normocephalic, pupils equally reactive to light. Neck: supple, no JVD. No cervical lymphadenopathy.   Noted crepitus on left jaw when opening /closing jaw, nttp, no masses palpated. Chest:Good air entry bilaterally, no added sounds. CVS: S1 S2 regular, no murmurs/gallups or rubs. Abdomen: Bowel sounds active, Non tender and not distended with no gaurding, rigidity or rebound. Extremities: B/L Lower  Ext shows no edema, both legs are warm to touch Neurology: Awake alert, and oriented X 3, CN II-XII grossly intact, Non focal Skin:No Rash  Data Review Lab Results  Component Value Date   HGBA1C 5.0 05/14/2016    Depression screen [facility] 2/9 05/14/2016 09/11/2015 09/07/2014  Decreased Interest 2 2 3   Down, Depressed, Hopeless 2 2 3   PHQ - 2 Score 4 4 6   Altered sleeping 2 3 1   Tired, decreased energy 2 1 1   Change in appetite 0 2 3  Feeling bad or failure about yourself  3 3 3   Trouble concentrating 2 1 1   Moving slowly or fidgety/restless 1 0 1  Suicidal thoughts 0 0 0  PHQ-9 Score 14 14 16   Difficult doing work/chores - Extremely dIfficult -      Assessment & Plan   1. Hypothyroidism, unspecified type Will chk levels prior to renewing synthroid. - TSH - T4, Free  2. Bipolar disorder, current episode mixed, moderate (HCC) Defer rx to [facility].  3. Vitamin D deficiency Will rechk levels, may need refill on rx, since has not been taking otc Recd take calcium 1200mg  /daily as well.  4. Arthralgia of left temporomandibular joint - tmj tips provided - DG TMJ Open & Close Unilateral Left; Future  5. Hyperlipidemia, unspecified hyperlipidemia type - CBC with Differential - CMP and Liver - Lipid Panel - currently on pravastatin 20 qhs   6. Need for hepatitis C screening test - Hepatitis C antibody  7. Encounter for screening for HIV - HIV antibody (with reflex)  8. Diabetes mellitus screening - POCT glycosylated hemoglobin (Hb A1C) 5.0  9. Colon cancer screening Never had due to poor prep prior - Ambulatory referral to Gastroenterology      Patient have been counseled extensively about nutrition and exercise  Return in about 3 months (around 08/11/2016), or if symptoms worsen or fail to improve.  The patient was given clear instructions to go to ER or return to medical center if symptoms don't improve, worsen or new problems develop. The patient verbalized  understanding. The patient was told to call to get lab results if they haven't heard anything in the next week.   This note has been created with Education officer, environmental. Any transcriptional errors are unintentional.   [name], MD, MBA/MHA [facility] [place], [place] [phone]   05/14/2016, 10:31 AM

## 2016-05-14 NOTE — Patient Instructions (Addendum)
Calcium 1200mg / day Vit d - ?Td Vaccine (Tetanus and Diphtheria): What You Need to Know 1. Why get vaccinated? Tetanus  and diphtheria are very serious diseases. They are rare in the [place] today, but people who do become infected often have severe complications. Td vaccine is used to protect adolescents and adults from both of these diseases. Both tetanus and diphtheria are infections caused by bacteria. Diphtheria spreads from person to person through coughing or sneezing. Tetanus-causing bacteria enter the body through cuts, scratches, or wounds. TETANUS (lockjaw) causes painful muscle tightening and stiffness, usually all over the body.  It can lead to tightening of muscles in the head and neck so you can't open your mouth, swallow, or sometimes even breathe. Tetanus kills about 1 out of every 10 people who are infected even after receiving the best medical care. DIPHTHERIA can cause a thick coating to form in the back of the throat.  It can lead to breathing problems, paralysis, heart failure, and death. Before vaccines, as many as 200,000 cases of diphtheria and hundreds of cases of tetanus were reported in the [place] each year. Since vaccination began, reports of cases for both diseases have dropped by about 99%. 2. Td vaccine Td vaccine can protect adolescents and adults from tetanus and diphtheria. Td is usually given as a booster dose every 10 years but it can also be given earlier after a severe and dirty wound or burn. Another vaccine, called Tdap, which protects against pertussis in addition to tetanus and diphtheria, is sometimes recommended instead of Td vaccine. Your doctor or the person giving you the vaccine can give you more information. Td may safely be given at the same time as other vaccines. 3. Some people should not get this vaccine  A person who has ever had a life-threatening allergic reaction after a previous dose of any tetanus or diphtheria containing  vaccine, OR has a severe allergy to any part of this vaccine, should not get Td vaccine. Tell the person giving the vaccine about any severe allergies.  Talk to your doctor if you:  had severe pain or swelling after any vaccine containing diphtheria or tetanus,  ever had a condition called Guillain Barre Syndrome (GBS),  aren't feeling well on the day the shot is scheduled. 4. What are the risks from Td vaccine? With any medicine, including vaccines, there is a chance of side effects. These are usually mild and go away on their own. Serious reactions are also possible but are rare. Most people who get Td vaccine do not have any problems with it. Mild problems following Td vaccine:  (Did not interfere with activities)  Pain where the shot was given (about 8 people in 10)  Redness or swelling where the shot was given (about 1 person in 4)  Mild fever (rare)  Headache (about 1 person in 4)  Tiredness (about 1 person in 4) Moderate problems following Td vaccine:  (Interfered with activities, but did not require medical attention)  Fever over 102F (rare) Severe problems following Td vaccine:  (Unable to perform usual activities; required medical attention)  Swelling, severe pain, bleeding and/or redness in the arm where the shot was given (rare). Problems that could happen after any vaccine:   People sometimes faint after a medical procedure, including vaccination. Sitting or lying down for about 15 minutes can help prevent fainting, and injuries caused by a fall. Tell your doctor if you feel dizzy, or have vision changes or ringing in the  ears.  Some people get severe pain in the shoulder and have difficulty moving the arm where a shot was given. This happens very rarely.  Any medication can cause a severe allergic reaction. Such reactions from a vaccine are very rare, estimated at fewer than 1 in a million doses, and would happen within a few minutes to a few hours after the  vaccination. As with any medicine, there is a very remote chance of a vaccine causing a serious injury or death. The safety of vaccines is always being monitored. For more information, visit: http://floyd.org/ 5. What if there is a serious reaction? What should I look for?  Look for anything that concerns you, such as signs of a severe allergic reaction, very high fever, or unusual behavior. Signs of a severe allergic reaction can include hives, swelling of the face and throat, difficulty breathing, a fast heartbeat, dizziness, and weakness. These would usually start a few minutes to a few hours after the vaccination. What should I do?   If you think it is a severe allergic reaction or other emergency that can't wait, call 9-1-1 or get the person to the nearest hospital. Otherwise, call your doctor.  Afterward, the reaction should be reported to the Vaccine Adverse Event Reporting System (VAERS). Your doctor might file this report, or you can do it yourself through the VAERS web site at www.vaers.LAgents.no, or by calling 1-(207)030-0753.  VAERS does not give medical advice. 6. The National Vaccine Injury Compensation Program The [employer] Vaccine Injury Compensation Program (VICP) is a federal program that was created to compensate people who may have been injured by certain vaccines. Persons who believe they may have been injured by a vaccine can learn about the program and about filing a claim by calling 1-323-696-8456 or visiting the VICP website at SpiritualWord.at. There is a time limit to file a claim for compensation. 7. How can I learn more?  Ask your doctor. He or she can give you the vaccine package insert or suggest other sources of information.  Call your local or state health department.  Contact the Centers for Disease Control and Prevention (CDC):  Call 7323432026 (1-800-CDC-INFO)  Visit CDC's website at PicCapture.uy CDC Td Vaccine VIS  (06/26/15) This information is not intended to replace advice given to you by your health care provider. Make sure you discuss any questions you have with your health care provider. Document Released: 12/29/2005 Document Revised: 11/22/2015 Document Reviewed: 11/22/2015 Elsevier Interactive Patient Education  2017 Elsevier Inc.  -  Jaw Range of Motion Exercises Jaw range of motion exercises are exercises that help your jaw to move better. These exercises can help to prevent:  Difficulty opening your mouth.  Pain in your jaw while it is both open and closed. What should I be careful of when doing jaw exercises? Make sure that you only do jaw exercises as directed by your health care provider. You should only move your jaw as far as it can go in each direction, if told to do so by your health care provider. Do not move your jaw into positions that cause you any pain. What exercises should I do?  Stick your jaw forward. Hold this position for 1-2 seconds. Allow your jaw to return to its normal position and rest it there for 1-2 seconds. Do this exercise 8 times.  Stand or sit in front of a mirror. Place your tongue on the roof of your mouth, just behind your top teeth. Slowly open and close  your jaw, keeping your tongue on the roof of your mouth. While you open and close your mouth, try to keep your jaw from moving toward one side or the other. Repeat this 8 times.  Move your jaw right. Hold this position for 1-2 seconds. Allow your jaw to return to its normal position, and rest it there for 1-2 seconds. Do this exercise 8 times.  Move your jaw left. Hold this position for 1-2 seconds. Allow your jaw to return to its normal position, and rest it there for 1-2 seconds. Do this exercise 8 times.  Open your mouth as far as it is can comfortably go. Hold this position for 1-2 seconds. Then close your mouth and rest for 1-2 seconds. Do this exercise 8 times.  Move your jaw in a circular motion,  starting toward the right (clockwise). Repeat this 8 times.  Move your jaw in a circular motion, starting toward the left (counterclockwise). Repeat this 8 times. Apply moist heat packs or ice packs to your jaw before or after performing your exercises as directed by your health care provider. What else can I do? Avoid the following, if they cause jaw pain or they increase your jaw pain:  Chewing gum.  Clenching your jaw or teeth or keeping tension in your jaw muscles.  Leaning on your jaw, such as resting your jaw in your hand while leaning on a desk. This information is not intended to replace advice given to you by your health care provider. Make sure you discuss any questions you have with your health care provider. Document Released: 02/14/2008 Document Revised: 08/09/2015 Document Reviewed: 02/01/2014 Elsevier Interactive Patient Education  2017 Elsevier Inc. - Temporomandibular Joint Syndrome Temporomandibular joint (TMJ) syndrome is a condition that affects the joints between your jaw and your skull. The TMJs are located near your ears and allow your jaw to open and close. These joints and the nearby muscles are involved in all movements of the jaw. People with TMJ syndrome have pain in the area of these joints and muscles. Chewing, biting, or other movements of the jaw can be difficult or painful. TMJ syndrome can be caused by various things. In many cases, the condition is mild and goes away within a few weeks. For some people, the condition can become a long-term problem. What are the causes? Possible causes of TMJ syndrome include:  Grinding your teeth or clenching your jaw. Some people do this when they are under stress.  Arthritis.  Injury to the jaw.  Head or neck injury.  Teeth or dentures that are not aligned well. In some cases, the cause of TMJ syndrome may not be known. What are the signs or symptoms? The most common symptom is an aching pain on the side of the  head in the area of the TMJ. Other symptoms may include:  Pain when moving your jaw, such as when chewing or biting.  Being unable to open your jaw all the way.  Making a clicking sound when you open your mouth.  Headache.  Earache.  Neck or shoulder pain. How is this diagnosed? Diagnosis can usually be made based on your symptoms, your medical history, and a physical exam. Your health care provider may check the range of motion of your jaw. Imaging tests, such as X-rays or an MRI, are sometimes done. You may need to see your dentist to determine if your teeth and jaw are lined up correctly. How is this treated? TMJ syndrome often goes away  on its own. If treatment is needed, the options may include:  Eating soft foods and applying ice or heat.  Medicines to relieve pain or inflammation.  Medicines to relax the muscles.  A splint, bite plate, or mouthpiece to prevent teeth grinding or jaw clenching.  Relaxation techniques or counseling to help reduce stress.  Transcutaneous electrical nerve stimulation (TENS). This helps to relieve pain by applying an electrical current through the skin.  Acupuncture. This is sometimes helpful to relieve pain.  Jaw surgery. This is rarely needed. Follow these instructions at home:  Take medicines only as directed by your health care provider.  Eat a soft diet if you are having trouble chewing.  Apply ice to the painful area.  Put ice in a plastic bag.  Place a towel between your skin and the bag.  Leave the ice on for 20 minutes, 2-3 times a day.  Apply a warm compress to the painful area as directed.  Massage your jaw area and perform any jaw stretching exercises as recommended by your health care provider.  If you were given a mouthpiece or bite plate, wear it as directed.  Avoid foods that require a lot of chewing. Do not chew gum.  Keep all follow-up visits as directed by your health care provider. This is  important. Contact a health care provider if:  You are having trouble eating.  You have new or worsening symptoms. Get help right away if:  Your jaw locks open or closed. This information is not intended to replace advice given to you by your health care provider. Make sure you discuss any questions you have with your health care provider. Document Released: 11/26/2000 Document Revised: 11/01/2015 Document Reviewed: 10/06/2013 Elsevier Interactive Patient Education  2017 [employer]

## 2016-05-15 ENCOUNTER — Other Ambulatory Visit: Payer: Self-pay | Admitting: Internal Medicine

## 2016-05-15 LAB — HEPATITIS C ANTIBODY: HCV Ab: NEGATIVE

## 2016-05-15 LAB — HIV ANTIBODY (ROUTINE TESTING W REFLEX): HIV: NONREACTIVE

## 2016-05-15 MED ORDER — PRAVASTATIN SODIUM 20 MG PO TABS: 20.0000 mg | ORAL_TABLET | Freq: Every day | ORAL | 3 refills | Status: DC

## 2016-05-15 MED ORDER — LEVOTHYROXINE SODIUM 150 MCG PO TABS: 150.0000 ug | ORAL_TABLET | Freq: Every day | ORAL | 0 refills | Status: DC

## 2016-05-21 ENCOUNTER — Telehealth: Payer: Self-pay

## 2016-05-21 NOTE — Telephone Encounter (Signed)
Contacted pt to go over lab results pt didn't answer lvm asking pt to give me a call at her earliest convenience  

## 2016-05-23 ENCOUNTER — Telehealth: Payer: Self-pay | Admitting: Internal Medicine

## 2016-05-23 NOTE — Telephone Encounter (Signed)
Pt. Returned nurse call regarding results.  °

## 2016-06-11 ENCOUNTER — Encounter: Payer: Self-pay | Admitting: Internal Medicine

## 2016-06-18 MED FILL — FAMOTIDINE 20 MG TABLET: 20 | 30 days supply | Qty: 60 | Fill #2

## 2016-06-18 MED FILL — PRAVASTATIN NA 20 MG TAB: 20 | 90 days supply | Qty: 90 | Fill #0

## 2016-06-18 MED FILL — LEVOTHYROXINE 150 MCG TAB: 150 | 90 days supply | Qty: 90 | Fill #0

## 2016-07-31 ENCOUNTER — Encounter: Payer: Self-pay | Admitting: Internal Medicine

## 2016-09-22 DIAGNOSIS — F3132 Bipolar disorder, current episode depressed, moderate: Secondary | ICD-10-CM | POA: Diagnosis not present

## 2016-09-29 MED FILL — OMEPRAZOLE DR 20 MG CAPSULE: 20 | 30 days supply | Qty: 30 | Fill #1

## 2016-09-29 MED FILL — PRAVASTATIN NA 20 MG TAB: 20 | 90 days supply | Qty: 90 | Fill #1

## 2016-10-30 ENCOUNTER — Telehealth: Payer: Self-pay

## 2016-10-30 MED ORDER — LEVOTHYROXINE SODIUM 150 MCG PO TABS: 150.0000 ug | ORAL_TABLET | Freq: Every day | ORAL | 0 refills | Status: DC

## 2016-10-30 NOTE — Telephone Encounter (Signed)
Pt contacted the office requesting a refill on on her levothyroxine. Pt has an appointment schedule for 11-21-16

## 2016-10-30 NOTE — Telephone Encounter (Signed)
Levothyroxine refilled x 30 days 

## 2016-11-05 ENCOUNTER — Telehealth: Payer: Self-pay | Admitting: Internal Medicine

## 2016-11-21 ENCOUNTER — Encounter: Payer: Self-pay | Admitting: Family Medicine

## 2016-11-21 ENCOUNTER — Other Ambulatory Visit (HOSPITAL_COMMUNITY)
Admission: RE | Admit: 2016-11-21 | Discharge: 2016-11-21 | Disposition: A | Discharge status: Home / Self Care | Payer: Medicare HMO | Source: Ambulatory Visit | Attending: Family Medicine | Admitting: Family Medicine

## 2016-11-21 ENCOUNTER — Other Ambulatory Visit: Payer: Self-pay

## 2016-11-21 ENCOUNTER — Ambulatory Visit: Payer: Medicare HMO | Attending: Family Medicine | Admitting: Family Medicine

## 2016-11-21 VITALS — BP 114/77 | HR 62 | Temp 97.9°F | Resp 18 | Ht 66.0 in | Wt 164.2 lb

## 2016-11-21 DIAGNOSIS — G8929 Other chronic pain: Secondary | ICD-10-CM | POA: Diagnosis not present

## 2016-11-21 DIAGNOSIS — R300 Dysuria: Secondary | ICD-10-CM | POA: Diagnosis not present

## 2016-11-21 DIAGNOSIS — R0609 Other forms of dyspnea: Secondary | ICD-10-CM | POA: Insufficient documentation

## 2016-11-21 DIAGNOSIS — M545 Low back pain: Secondary | ICD-10-CM | POA: Diagnosis present

## 2016-11-21 DIAGNOSIS — E785 Hyperlipidemia, unspecified: Secondary | ICD-10-CM

## 2016-11-21 DIAGNOSIS — E039 Hypothyroidism, unspecified: Secondary | ICD-10-CM | POA: Insufficient documentation

## 2016-11-21 DIAGNOSIS — F319 Bipolar disorder, unspecified: Secondary | ICD-10-CM | POA: Insufficient documentation

## 2016-11-21 DIAGNOSIS — M5441 Lumbago with sciatica, right side: Secondary | ICD-10-CM | POA: Diagnosis not present

## 2016-11-21 DIAGNOSIS — L299 Pruritus, unspecified: Secondary | ICD-10-CM | POA: Diagnosis not present

## 2016-11-21 LAB — POCT URINALYSIS DIPSTICK
Bilirubin, UA: NEGATIVE
Glucose, UA: NEGATIVE
Ketones, UA: NEGATIVE
NITRITE UA: NEGATIVE
PH UA: 5 (ref 5.0–8.0)
PROTEIN UA: NEGATIVE
SPEC GRAV UA: 1.015 (ref 1.010–1.025)
UROBILINOGEN UA: 0.2 U/dL

## 2016-11-21 MED ORDER — NAPROXEN 500 MG PO TABS: ORAL_TABLET | ORAL | 0 refills | Status: DC

## 2016-11-21 MED ORDER — TRAMADOL HCL 50 MG PO TABS: 50.0000 mg | ORAL_TABLET | Freq: Three times a day (TID) | ORAL | 0 refills | Status: DC | PRN

## 2016-11-21 MED ORDER — HYDROXYZINE HCL 50 MG PO TABS
50.0000 mg | ORAL_TABLET | Freq: Three times a day (TID) | ORAL | 0 refills | Status: DC | PRN
Start: 2016-11-21 — End: 2017-08-28

## 2016-11-21 MED FILL — traMADol HCL 50 MG TABS: 50 | 10 days supply | Qty: 30 | Fill #0

## 2016-11-21 MED FILL — NAPROXEN 500 MG TABLET: 500 | 20 days supply | Qty: 40 | Fill #0

## 2016-11-21 NOTE — Progress Notes (Signed)
Subjective:  Patient ID: Rachel Everett, female    DOB: 10-28-1960  Age: 56 y.o. MRN: [id number]  CC: Establish Care   HPI Rachel Everett presents for multiple complaints. She has a past medical history of hypothyroidism. She denies weight changes, heat/cold intolerance, bowel/skin changes or palpitations or chest pain.She reports complaints of back pain over the last several months. She denies any history of injuryor changes in bowel or bladder habits. She reports pain 6 /10 on average,9/10 at its worst. Associated symptoms include. Symptoms are aggravated by bending and prolonged standing. She complains of chest fullness reports two previous episodes within the last 6 years. She reports episode occurred 1 week ago. Associated symptoms include shortness of breath and fatigue with dyspnea. She is not a current smoker. She does have a 20 year history of smoking. She reports quitting 10 years ago. She denies any family history of heart disease. She does report history of hyperlipidemia. She complains of burning urination. She denies any vaginal discharge, vaginal lesions, cloudy or foul-smelling urine, hematuria or fevers. History of bipolar disorder. She denies any SI/HI. She receives mental health services from [facility]. She complains of pruritus. She denies any rashes or contacts with similar symptoms. She denies taking anything for symptoms.  Outpatient Medications Prior to Visit  Medication Sig Dispense Refill  . lamoTRIgine (LAMICTAL) 200 MG tablet Take 300 mg by mouth daily.   0  . levothyroxine (SYNTHROID, LEVOTHROID) 150 MCG tablet Take 1 tablet (150 mcg total) by mouth daily. 30 tablet 0  . lithium carbonate (LITHOBID) 300 MG CR tablet Take 300 mg by mouth 3 (three) times daily.   0  . pravastatin (PRAVACHOL) 20 MG tablet Take 1 tablet (20 mg total) by mouth daily. 90 tablet 3  . QUEtiapine (SEROQUEL) 200 MG tablet Take 200 mg by mouth as needed.   0  . hydrOXYzine (ATARAX/VISTARIL) 50 MG tablet  Take 50 mg by mouth 3 (three) times daily as needed for anxiety.     . bisacodyl (DULCOLAX) 5 MG EC tablet Take 5 mg by mouth daily as needed for moderate constipation. Dulcolax 5 mg tab take as directed for colonoscopy prep.    . calcium carbonate (OS-CAL) 600 MG tablet Take 1 tablet (600 mg total) by mouth 2 (two) times daily with a meal. (Patient not taking: Reported on 11/21/2016) 60 tablet 3  . famotidine (PEPCID) 20 MG tablet Take 1 tablet (20 mg total) by mouth 2 (two) times daily. 60 tablet 3  . polyethylene glycol powder (GLYCOLAX/MIRALAX) powder Take 1 Container by mouth once. Miralax 238 grams as directed for colonoscopy prep.    Rolene Arbour BOWEL PREP KIT 17.5-3.13-1.6 GM/180ML SOLN Take 1 kit by mouth once. Brand Name Only.  No Substitutions.  Suprep Bowel Kit. (Patient not taking: Reported on 05/14/2016) 177 mL 0  . Vitamin D, Ergocalciferol, (DRISDOL) 50000 units CAPS capsule Take 1 capsule (50,000 Units total) by mouth every 7 (seven) days. (Patient not taking: Reported on 05/14/2016) 12 capsule 0  . NAPROXEN PO Take 300 mg by mouth.     No facility-administered medications prior to visit.     ROS Review of Systems  Constitutional: Positive for fatigue.  Respiratory: Positive for chest tightness.   Cardiovascular: Negative.   Gastrointestinal: Negative.   Endocrine: Negative.   Genitourinary: Positive for dysuria.  Musculoskeletal: Positive for back pain.  Skin: Negative.   Neurological: Negative.   Psychiatric/Behavioral: Negative for suicidal ideas.       History of  bipolar    Objective:  BP 114/77 (BP Location: Left Arm, Patient Position: Sitting, Cuff Size: Normal)   Pulse 62   Temp 97.9 F (36.6 C) (Oral)   Resp 18   Ht 5\' 6"  (1.676 m)   Wt 164 lb 3.2 oz (74.5 kg)   SpO2 97%   BMI 26.50 kg/m   BP/Weight 11/21/2016 05/14/2016 10/05/2015  Systolic BP 114 108 -  Diastolic BP 77 74 -  Wt. (Lbs) 164.2 167.4 163.2  BMI 26.5 28.73 28     Physical Exam    Constitutional: She is oriented to person, place, and time. She appears well-developed and well-nourished.  HENT:  Head: Normocephalic and atraumatic.  Right Ear: External ear normal.  Left Ear: External ear normal.  Nose: Nose normal.  Mouth/Throat: Oropharynx is clear and moist.  Eyes: Pupils are equal, round, and reactive to light. Conjunctivae are normal.  Neck: Normal range of motion. Neck supple. No thyromegaly present.  Cardiovascular: Normal rate, regular rhythm, normal heart sounds and intact distal pulses.   Pulmonary/Chest: Effort normal and breath sounds normal.  Abdominal: Soft. Bowel sounds are normal. There is no tenderness.  Musculoskeletal: Normal range of motion.       Lumbar back: She exhibits pain (Positive straight leg test to right lower extremity.).  Lymphadenopathy:    She has no cervical adenopathy.  Neurological: She is alert and oriented to person, place, and time. She has normal reflexes. No cranial nerve deficit.  Skin: Skin is warm and dry.  Psychiatric: She expresses no suicidal ideation. She expresses no suicidal plans.  Nursing note and vitals reviewed.  Depression screen [facility] 2/9 11/21/2016 05/14/2016 09/11/2015  Decreased Interest 1 2 2   Down, Depressed, Hopeless 1 2 2   PHQ - 2 Score 2 4 4   Altered sleeping 3 2 3   Tired, decreased energy 3 2 1   Change in appetite 1 0 2  Feeling bad or failure about yourself  1 3 3   Trouble concentrating 1 2 1   Moving slowly or fidgety/restless 1 1 0  Suicidal thoughts 0 0 0  PHQ-9 Score 12 14 14   Difficult doing work/chores - - Extremely dIfficult   GAD 7 : Generalized Anxiety Score 11/21/2016 05/14/2016 09/11/2015  Nervous, Anxious, on Edge 1 2 3   Control/stop worrying 2 2 3   Worry too much - different things 2 2 3   Trouble relaxing 2 2 3   Restless 1 1 1   Easily annoyed or irritable 1 1 2   Afraid - awful might happen 2 2 3   Total GAD 7 Score 11 12 18   Anxiety Difficulty - - Extremely difficult       Assessment & Plan:   Problem List Items Addressed This Visit      Endocrine   Hypothyroidism   Relevant Orders   TSH (Completed)    Other Visit Diagnoses    Chronic midline low back pain with right-sided sciatica    -  Primary   Relevant Medications   naproxen (NAPROSYN) 500 MG tablet   traMADol (ULTRAM) 50 MG tablet   hydrOXYzine (ATARAX/VISTARIL) 50 MG tablet   Other Relevant Orders   Ambulatory referral to Orthopedics   Ambulatory referral to Physical Therapy   DOE (dyspnea on exertion)       Relevant Orders   CBC with Differential (Completed)   Basic metabolic panel (Completed)   EKG 12-Lead   DG Chest 2 View   Dysuria       Relevant Orders   Urinalysis  Dipstick (Completed)   Urine cytology ancillary only   Dyslipidemia       Relevant Orders   Lipid Panel (Completed)   Pruritus       Relevant Medications   hydrOXYzine (ATARAX/VISTARIL) 50 MG tablet      Meds ordered this encounter  Medications  . naproxen (NAPROSYN) 500 MG tablet    Sig: TAKE ONE TABLET BY MOUTH WITH MEALS TWICE A DAY FOR 10 DAYS. THEN TAKE ONE TABLET BY MOUTH WITH MEALS ONCE DAILY AS NEEDED.    Dispense:  40 tablet    Refill:  0    Order Specific Question:   Supervising Provider    Answer:   Quentin Angst [id number]  . traMADol (ULTRAM) 50 MG tablet    Sig: Take 1 tablet (50 mg total) by mouth every 8 (eight) hours as needed.    Dispense:  30 tablet    Refill:  0    Order Specific Question:   Supervising Provider    Answer:   Quentin Angst [id number]  . hydrOXYzine (ATARAX/VISTARIL) 50 MG tablet    Sig: Take 1 tablet (50 mg total) by mouth every 8 (eight) hours as needed for anxiety or itching.    Dispense:  40 tablet    Refill:  0    Order Specific Question:   Supervising Provider    Answer:   Quentin Angst [id number]    Follow-up: Return in about 8 weeks (around 01/16/2017), or if symptoms worsen or fail to improve, for Thyroid/ Back Pain.   Lizbeth Bark FNP

## 2016-11-21 NOTE — Progress Notes (Signed)
Patient is here for thyroids  Patient complains chest pain & lower back pain

## 2016-11-21 NOTE — Patient Instructions (Signed)

## 2016-11-22 LAB — CBC WITH DIFFERENTIAL/PLATELET
Basophils Absolute: 0 10*3/uL (ref 0.0–0.2)
Basos: 1 %
EOS (ABSOLUTE): 0.3 10*3/uL (ref 0.0–0.4)
EOS: 4 %
HEMATOCRIT: 43.4 % (ref 34.0–46.6)
HEMOGLOBIN: 14.1 g/dL (ref 11.1–15.9)
IMMATURE GRANULOCYTES: 0 %
Immature Grans (Abs): 0 10*3/uL (ref 0.0–0.1)
Lymphocytes Absolute: 3.4 10*3/uL — ABNORMAL HIGH (ref 0.7–3.1)
Lymphs: 40 %
MCH: 29.7 pg (ref 26.6–33.0)
MCHC: 32.5 g/dL (ref 31.5–35.7)
MCV: 92 fL (ref 79–97)
MONOCYTES: 7 %
Monocytes Absolute: 0.6 10*3/uL (ref 0.1–0.9)
NEUTROS PCT: 48 %
Neutrophils Absolute: 4.3 10*3/uL (ref 1.4–7.0)
Platelets: 298 10*3/uL (ref 150–379)
RBC: 4.74 x10E6/uL (ref 3.77–5.28)
RDW: 13.9 % (ref 12.3–15.4)
WBC: 8.7 10*3/uL (ref 3.4–10.8)

## 2016-11-22 LAB — BASIC METABOLIC PANEL
BUN/Creatinine Ratio: 16 (ref 9–23)
BUN: 15 mg/dL (ref 6–24)
CALCIUM: 9.9 mg/dL (ref 8.7–10.2)
CO2: 26 mmol/L (ref 20–29)
CREATININE: 0.91 mg/dL (ref 0.57–1.00)
Chloride: 100 mmol/L (ref 96–106)
GFR calc Af Amer: 82 mL/min/{1.73_m2} (ref >59)
GFR, EST NON AFRICAN AMERICAN: 71 mL/min/{1.73_m2} (ref >59)
Glucose: 87 mg/dL (ref 65–99)
POTASSIUM: 4.2 mmol/L (ref 3.5–5.2)
Sodium: 139 mmol/L (ref 134–144)

## 2016-11-22 LAB — LIPID PANEL
CHOLESTEROL TOTAL: 244 mg/dL — AB (ref 100–199)
Chol/HDL Ratio: 3.5 ratio (ref 0.0–4.4)
HDL: 69 mg/dL (ref >39)
LDL CALC: 143 mg/dL — AB (ref 0–99)
TRIGLYCERIDES: 160 mg/dL — AB (ref 0–149)
VLDL Cholesterol Cal: 32 mg/dL (ref 5–40)

## 2016-11-22 LAB — TSH: TSH: 2.3 u[IU]/mL (ref 0.450–4.500)

## 2016-11-24 ENCOUNTER — Other Ambulatory Visit: Payer: Self-pay | Admitting: Family Medicine

## 2016-11-24 DIAGNOSIS — E039 Hypothyroidism, unspecified: Secondary | ICD-10-CM

## 2016-11-24 DIAGNOSIS — E782 Mixed hyperlipidemia: Secondary | ICD-10-CM | POA: Insufficient documentation

## 2016-11-24 MED ORDER — ATORVASTATIN CALCIUM 20 MG PO TABS: 20.0000 mg | ORAL_TABLET | Freq: Every day | ORAL | 2 refills | Status: DC

## 2016-11-24 MED ORDER — LEVOTHYROXINE SODIUM 150 MCG PO TABS: 150.0000 ug | ORAL_TABLET | Freq: Every day | ORAL | 2 refills | Status: DC

## 2016-11-24 MED FILL — LEVOTHYROXINE 150 MCG TAB: 150 | 30 days supply | Qty: 30 | Fill #0

## 2016-11-24 MED FILL — ATORVASTATIN 20 MG TABLET: 20 | 30 days supply | Qty: 30 | Fill #0

## 2016-11-25 ENCOUNTER — Telehealth: Payer: Self-pay

## 2016-11-25 NOTE — Telephone Encounter (Signed)
CMA call regarding lab results   Patient did not answer but left a VM stating there reason of the call & to call back

## 2016-11-25 NOTE — Telephone Encounter (Signed)
-----   Message from Lizbeth BarkMandesia R Hairston, [place] sent at 11/24/2016  6:16 PM EDT ----- Labs that evaluated your blood cells, fluid and electrolyte balance are normal. No signs of anemia, acute infection, or inflammation present. Kidney function normal Liver function normal Thyroid function normal on current dosage of levothyroxine. Lipid levels were elevated. This can increase your risk of heart disease overtime. You will be prescribed atorvastatin to help lower risk. Stop taking pravastatin. Start eating a diet low in saturated fat. Limit your intake of fried foods, red meats, and whole milk. Increase activity.  Recommend follow up in 3 months.

## 2016-11-26 LAB — URINE CYTOLOGY ANCILLARY ONLY
Bacterial vaginitis: NEGATIVE
CANDIDA VAGINITIS: NEGATIVE

## 2016-11-27 ENCOUNTER — Other Ambulatory Visit: Payer: Self-pay | Admitting: Family Medicine

## 2016-11-27 DIAGNOSIS — N3001 Acute cystitis with hematuria: Secondary | ICD-10-CM

## 2016-11-27 MED ORDER — NITROFURANTOIN MONOHYD MACRO 100 MG PO CAPS: 100.0000 mg | ORAL_CAPSULE | Freq: Two times a day (BID) | ORAL | 0 refills | Status: DC

## 2016-12-01 ENCOUNTER — Telehealth: Payer: Self-pay

## 2016-12-01 NOTE — Telephone Encounter (Signed)
-----   Message from Lizbeth Bark, FNP sent at 11/27/2016  2:45 PM EDT ----- BV, Yeast, negative.  Urinalysis positive for wbc and microscopic blood. You will be prescribed macrobid for uti symptoms.

## 2016-12-01 NOTE — Telephone Encounter (Signed)
CMA call regarding lab results   Patient did not answer but left a VM stating the reason of the call &  to call me back  

## 2016-12-19 ENCOUNTER — Ambulatory Visit: Payer: Commercial Managed Care - HMO | Admitting: Family Medicine

## 2017-01-09 MED FILL — ATORVASTATIN 20 MG TABLET: 20 | 30 days supply | Qty: 30 | Fill #1

## 2017-02-19 DIAGNOSIS — H524 Presbyopia: Secondary | ICD-10-CM | POA: Diagnosis not present

## 2017-03-13 ENCOUNTER — Other Ambulatory Visit: Payer: Self-pay | Admitting: Family Medicine

## 2017-03-13 MED FILL — ATORVASTATIN 20 MG TABLET: 20 | 30 days supply | Qty: 30 | Fill #2

## 2017-03-13 MED FILL — LEVOTHYROXINE 150 MCG TAB: 150 | 30 days supply | Qty: 30 | Fill #1

## 2017-03-16 ENCOUNTER — Encounter: Payer: Self-pay | Admitting: Family Medicine

## 2017-03-16 ENCOUNTER — Ambulatory Visit: Payer: Medicare HMO | Attending: Family Medicine | Admitting: Family Medicine

## 2017-03-16 ENCOUNTER — Other Ambulatory Visit: Payer: Self-pay | Admitting: Family Medicine

## 2017-03-16 VITALS — BP 108/74 | HR 67 | Temp 98.3°F | Resp 16 | Ht 66.0 in | Wt 171.8 lb

## 2017-03-16 DIAGNOSIS — B351 Tinea unguium: Secondary | ICD-10-CM | POA: Diagnosis not present

## 2017-03-16 DIAGNOSIS — K219 Gastro-esophageal reflux disease without esophagitis: Secondary | ICD-10-CM

## 2017-03-16 DIAGNOSIS — F319 Bipolar disorder, unspecified: Secondary | ICD-10-CM | POA: Diagnosis not present

## 2017-03-16 DIAGNOSIS — M549 Dorsalgia, unspecified: Secondary | ICD-10-CM | POA: Diagnosis present

## 2017-03-16 DIAGNOSIS — M25551 Pain in right hip: Secondary | ICD-10-CM | POA: Insufficient documentation

## 2017-03-16 DIAGNOSIS — B354 Tinea corporis: Secondary | ICD-10-CM | POA: Diagnosis not present

## 2017-03-16 DIAGNOSIS — E039 Hypothyroidism, unspecified: Secondary | ICD-10-CM | POA: Insufficient documentation

## 2017-03-16 DIAGNOSIS — G8929 Other chronic pain: Secondary | ICD-10-CM | POA: Insufficient documentation

## 2017-03-16 DIAGNOSIS — Z79899 Other long term (current) drug therapy: Secondary | ICD-10-CM | POA: Insufficient documentation

## 2017-03-16 DIAGNOSIS — M5441 Lumbago with sciatica, right side: Secondary | ICD-10-CM | POA: Diagnosis not present

## 2017-03-16 DIAGNOSIS — Z79890 Hormone replacement therapy (postmenopausal): Secondary | ICD-10-CM | POA: Diagnosis not present

## 2017-03-16 MED ORDER — OMEPRAZOLE 20 MG PO CPDR: 20.0000 mg | DELAYED_RELEASE_CAPSULE | Freq: Every day | ORAL | 2 refills | Status: DC

## 2017-03-16 MED ORDER — TERBINAFINE HCL 1 % EX CREA: 1.0000 "application " | TOPICAL_CREAM | Freq: Two times a day (BID) | CUTANEOUS | 0 refills | Status: DC

## 2017-03-16 MED ORDER — NAPROXEN 500 MG PO TABS: 500.0000 mg | ORAL_TABLET | Freq: Two times a day (BID) | ORAL | 0 refills | Status: DC | PRN

## 2017-03-16 MED ORDER — ACETAMINOPHEN-CODEINE #3 300-30 MG PO TABS: 1.0000 | ORAL_TABLET | Freq: Four times a day (QID) | ORAL | 0 refills | Status: DC | PRN

## 2017-03-16 MED ORDER — PREDNISONE 10 MG PO TABS: ORAL_TABLET | ORAL | 0 refills | Status: DC

## 2017-03-16 MED ORDER — CICLOPIROX 8 % EX SOLN: Freq: Every day | CUTANEOUS | 1 refills | Status: DC

## 2017-03-16 MED FILL — CICLOPIROX 8% SOLUTION: 8 | 28 days supply | Qty: 7 | Fill #0

## 2017-03-16 MED FILL — ACETAMINOPHEN/COD #3 TABLET: 300-30 | 10 days supply | Qty: 40 | Fill #0

## 2017-03-16 MED FILL — OMEPRAZOLE DR 20 MG CAPSULE: 20 | 30 days supply | Qty: 30 | Fill #0

## 2017-03-16 MED FILL — predniSONE 10 MG TABS: 10 | 6 days supply | Qty: 21 | Fill #0

## 2017-03-16 MED FILL — NAPROXEN 500 MG TABLET: 500 | 20 days supply | Qty: 40 | Fill #0

## 2017-03-16 NOTE — Progress Notes (Signed)
Patient stated she is here for back and hip pain . Patient stated she have a black toenail and her ringworm on her arm went away. Patient would like a hip x-ray.  Patient stated if she can have something for her back pain . Patient stated she sprain her back on Saturday.

## 2017-03-16 NOTE — Progress Notes (Signed)
Subjective:  Patient ID: Rachel Everett, female    DOB: 13-Oct-1960  Age: 56 y.o. MRN: [id number]  CC: Back Pain   HPI Rachel Everett presents for multiple complaints. She has a past medical history of hypothyroidism. She denies weight changes, heat/cold intolerance, bowel/skin changes or palpitations or chest pain.She reports complaints of back pain for over 6 months. She denies any history of injury or changes in bowel or bladder habits. She reports as moderate to moderately severe. Associated symptoms include: paresthesias to the right leg. Symptoms are aggravated by bending and prolonged standing. She c/o of right hip pain. Onset several a few months ago. Associated symptoms include weakness and the hip joint.She states " I feel like my hip is buckling". Symptoms aggravated by prolonged walking or standing. Symptoms have gradually worsened. NKI. She c/o rash. Onset several weeks ago. Location to right posterior forearm and lower back. Associated symptoms include pruritis and redness. She describes lesions as circular in appearance. She does reports recent rescue of stray cat. She c/o right foot toenail discoloration. Onset greater than 3 months. Toenail is dark brown in appearance. Denies any history of injury.  History of bipolar disorder. She denies any SI/HI. She receives mental health services from [place].   Outpatient Medications Prior to Visit  Medication Sig Dispense Refill  . atorvastatin (LIPITOR) 20 MG tablet Take 1 tablet (20 mg total) by mouth daily. 30 tablet 2  . hydrOXYzine (ATARAX/VISTARIL) 50 MG tablet Take 1 tablet (50 mg total) by mouth every 8 (eight) hours as needed for anxiety or itching. 40 tablet 0  . lamoTRIgine (LAMICTAL) 200 MG tablet Take 300 mg by mouth daily.   0  . levothyroxine (SYNTHROID, LEVOTHROID) 150 MCG tablet Take 1 tablet (150 mcg total) by mouth daily. 30 tablet 2  . lithium carbonate (LITHOBID) 300 MG CR tablet Take 300 mg by mouth 3 (three) times daily.   0   . polyethylene glycol powder (GLYCOLAX/MIRALAX) powder Take 1 Container by mouth once. Miralax 238 grams as directed for colonoscopy prep.    Marland Kitchen QUEtiapine (SEROQUEL) 200 MG tablet Take 200 mg by mouth as needed.   0  . naproxen (NAPROSYN) 500 MG tablet TAKE ONE TABLET BY MOUTH WITH MEALS TWICE A DAY FOR 10 DAYS. THEN TAKE ONE TABLET BY MOUTH WITH MEALS ONCE DAILY AS NEEDED. 40 tablet 0  . bisacodyl (DULCOLAX) 5 MG EC tablet Take 5 mg by mouth daily as needed for moderate constipation. Dulcolax 5 mg tab take as directed for colonoscopy prep.    . calcium carbonate (OS-CAL) 600 MG tablet Take 1 tablet (600 mg total) by mouth 2 (two) times daily with a meal. (Patient not taking: Reported on 11/21/2016) 60 tablet 3  . famotidine (PEPCID) 20 MG tablet Take 1 tablet (20 mg total) by mouth 2 (two) times daily. 60 tablet 3  . nitrofurantoin, macrocrystal-monohydrate, (MACROBID) 100 MG capsule Take 1 capsule (100 mg total) by mouth 2 (two) times daily. (Patient not taking: Reported on 03/16/2017) 10 capsule 0  . SUPREP BOWEL PREP KIT 17.5-3.13-1.6 GM/180ML SOLN Take 1 kit by mouth once. Brand Name Only.  No Substitutions.  Suprep Bowel Kit. (Patient not taking: Reported on 05/14/2016) 177 mL 0  . Vitamin D, Ergocalciferol, (DRISDOL) 50000 units CAPS capsule Take 1 capsule (50,000 Units total) by mouth every 7 (seven) days. (Patient not taking: Reported on 05/14/2016) 12 capsule 0  . traMADol (ULTRAM) 50 MG tablet Take 1 tablet (50 mg total) by mouth every  8 (eight) hours as needed. (Patient not taking: Reported on 03/16/2017) 30 tablet 0   No facility-administered medications prior to visit.    Review of Systems  Constitutional: Negative.   Respiratory: Negative.   Cardiovascular: Negative.   Musculoskeletal: Positive for arthralgias and back pain.  Skin: Positive for rash.       Toenail discoloration.  Neurological:       Parathesias  Psychiatric/Behavioral: Negative for suicidal ideas.    Objective:    BP 108/74 (BP Location: Right Arm, Patient Position: Sitting, Cuff Size: Normal)   Pulse 67   Temp 98.3 F (36.8 C) (Oral)   Resp 16   Ht 5\' 6"  (1.676 m)   Wt 171 lb 12.8 oz (77.9 kg)   SpO2 97%   BMI 27.73 kg/m   BP/Weight 03/16/2017 11/21/2016 05/14/2016  Systolic BP 108 114 108  Diastolic BP 74 77 74  Wt. (Lbs) 171.8 164.2 167.4  BMI 27.73 26.5 28.73   Physical Exam  Constitutional: She is oriented to person, place, and time. She appears well-nourished.  Cardiovascular: Normal rate, regular rhythm, normal heart sounds and intact distal pulses.  Pulmonary/Chest: Effort normal and breath sounds normal.  Musculoskeletal:       Right hip: She exhibits normal range of motion (pain with abduction).       Lumbar back: She exhibits pain (positive straight leg test to right lower extremity).  Neurological: She is alert and oriented to person, place, and time. She displays normal reflexes.  Skin: Rash noted. Rash is macular.     Dark brown discoloration to toenail of right foot.  Psychiatric: Her speech is normal. Her mood appears anxious. She expresses no homicidal and no suicidal ideation. She expresses no suicidal plans and no homicidal plans. She is attentive.  Nursing note and vitals reviewed.   Depression screen [facility] 2/9 03/16/2017 11/21/2016 05/14/2016  Decreased Interest 2 1 2   Down, Depressed, Hopeless 2 1 2   PHQ - 2 Score 4 2 4   Altered sleeping 2 3 2   Tired, decreased energy 3 3 2   Change in appetite 2 1 0  Feeling bad or failure about yourself  3 1 3   Trouble concentrating 3 1 2   Moving slowly or fidgety/restless 1 1 1   Suicidal thoughts 0 0 0  PHQ-9 Score 18 12 14   Difficult doing work/chores - - -   GAD 7 : Generalized Anxiety Score 03/16/2017 11/21/2016 05/14/2016 09/11/2015  Nervous, Anxious, on Edge 2 1 2 3   Control/stop worrying 3 2 2 3   Worry too much - different things 2 2 2 3   Trouble relaxing 3 2 2 3   Restless 1 1 1 1   Easily annoyed or irritable 1 1 1 2    Afraid - awful might happen 3 2 2 3   Total GAD 7 Score 15 11 12 18   Anxiety Difficulty - - - Extremely difficult      Assessment & Plan:   1. Chronic bilateral low back pain with right-sided sciatica  - DG Lumbar Spine Complete; Future - naproxen (NAPROSYN) 500 MG tablet; Take 1 tablet (500 mg total) by mouth 2 (two) times daily as needed for moderate pain. Take with meals.  Dispense: 40 tablet; Refill: 0 - acetaminophen-codeine (TYLENOL #3) 300-30 MG tablet; Take 1 tablet by mouth every 6 (six) hours as needed for severe pain.  Dispense: 40 tablet; Refill: 0 - Ambulatory referral to Physical Therapy - predniSONE (DELTASONE) 10 MG tablet; DAY 1: TAKE 6 TABLETS BY MOUTH WITH  MEAL; DAY 2: TAKE 5 TABLETS; DAY 3: TAKE 4 TABLETS; DAY 4: TAKE 3 TABLETS; DAY 5: TAKE 2 TABLETS; DAY 6: TAKE 1 TABLET.  Dispense: 21 tablet; Refill: 0  2. Chronic pain of right hip  - DG HIP UNILAT WITH PELVIS 2-3 VIEWS RIGHT; Future - naproxen (NAPROSYN) 500 MG tablet; Take 1 tablet (500 mg total) by mouth 2 (two) times daily as needed for moderate pain. Take with meals.  Dispense: 40 tablet; Refill: 0  3. Tinea corporis  - terbinafine (LAMISIL) 1 % cream; Apply 1 application topically 2 (two) times daily. Apply to affected areas. For 14 days.  Dispense: 30 g; Refill: 0  4. Hypothyroidism, unspecified type  - T4 AND TSH  5. Onychomycosis  - Ambulatory referral to Podiatry - ciclopirox (PENLAC) 8 % solution; Apply topically at bedtime. Apply over nail and surrounding skin. Apply daily over previous coat. After seven (7) days, may remove with alcohol and continue cycle.  Dispense: 6.6 mL; Refill: 1    Follow-up: Return if symptoms worsen or fail to improve.    Lizbeth Bark FNP

## 2017-03-18 ENCOUNTER — Ambulatory Visit (HOSPITAL_COMMUNITY)
Admission: RE | Admit: 2017-03-18 | Discharge: 2017-03-18 | Disposition: A | Discharge status: Home / Self Care | Payer: Medicare HMO | Source: Ambulatory Visit | Attending: Family Medicine | Admitting: Family Medicine

## 2017-03-18 DIAGNOSIS — M5441 Lumbago with sciatica, right side: Secondary | ICD-10-CM | POA: Insufficient documentation

## 2017-03-18 DIAGNOSIS — M25551 Pain in right hip: Secondary | ICD-10-CM | POA: Diagnosis not present

## 2017-03-18 DIAGNOSIS — G8929 Other chronic pain: Secondary | ICD-10-CM

## 2017-03-18 DIAGNOSIS — M545 Low back pain: Secondary | ICD-10-CM | POA: Diagnosis not present

## 2017-03-19 ENCOUNTER — Telehealth: Payer: Self-pay | Admitting: *Deleted

## 2017-03-19 ENCOUNTER — Other Ambulatory Visit: Payer: Self-pay | Admitting: Family Medicine

## 2017-03-19 DIAGNOSIS — G8929 Other chronic pain: Secondary | ICD-10-CM

## 2017-03-19 DIAGNOSIS — M5441 Lumbago with sciatica, right side: Principal | ICD-10-CM

## 2017-03-19 DIAGNOSIS — F3132 Bipolar disorder, current episode depressed, moderate: Secondary | ICD-10-CM | POA: Diagnosis not present

## 2017-03-19 DIAGNOSIS — M9979 Connective tissue and disc stenosis of intervertebral foramina of abdomen and other regions: Secondary | ICD-10-CM

## 2017-03-19 DIAGNOSIS — M2570 Osteophyte, unspecified joint: Secondary | ICD-10-CM

## 2017-03-19 NOTE — Telephone Encounter (Signed)
-----   Message from Lizbeth BarkMandesia R Hairston, FNP sent at 03/19/2017  1:56 PM EST ----- Xray of right hip is negative. No evidence of fracture or dislocation. X-ray of lower back shows narrowing of the spinal disc at the L5-S1 vertebrae. Mild and minimal narrowing along the additional sections of the spinal column. You will be referred to orthopedics.

## 2017-03-19 NOTE — Telephone Encounter (Signed)
Patient verified DOB Patient is aware of xray of the hip showing no evidence of dislocation or fracture. Patient is also aware of vertebrae showing some mild narrowing. Patient is aware of ortho referral being placed and to keep PT appointment. Patient reports on Tuesday 03/24/17 for TSH/T4 recheck. No further questions.

## 2017-03-24 ENCOUNTER — Ambulatory Visit: Payer: Medicare HMO | Attending: Family Medicine | Admitting: Family Medicine

## 2017-03-24 ENCOUNTER — Encounter: Payer: Self-pay | Admitting: Family Medicine

## 2017-03-24 ENCOUNTER — Ambulatory Visit: Payer: Medicare HMO

## 2017-03-24 ENCOUNTER — Other Ambulatory Visit (HOSPITAL_COMMUNITY)
Admission: RE | Admit: 2017-03-24 | Discharge: 2017-03-24 | Disposition: A | Discharge status: Home / Self Care | Payer: Medicare HMO | Source: Ambulatory Visit | Attending: Family Medicine | Admitting: Family Medicine

## 2017-03-24 VITALS — BP 92/62 | HR 64 | Temp 98.6°F | Resp 18

## 2017-03-24 DIAGNOSIS — Z1239 Encounter for other screening for malignant neoplasm of breast: Secondary | ICD-10-CM

## 2017-03-24 DIAGNOSIS — Z803 Family history of malignant neoplasm of breast: Secondary | ICD-10-CM

## 2017-03-24 DIAGNOSIS — Z01419 Encounter for gynecological examination (general) (routine) without abnormal findings: Secondary | ICD-10-CM | POA: Insufficient documentation

## 2017-03-24 DIAGNOSIS — Z79899 Other long term (current) drug therapy: Secondary | ICD-10-CM | POA: Insufficient documentation

## 2017-03-24 DIAGNOSIS — E039 Hypothyroidism, unspecified: Secondary | ICD-10-CM | POA: Diagnosis not present

## 2017-03-24 DIAGNOSIS — Z1231 Encounter for screening mammogram for malignant neoplasm of breast: Secondary | ICD-10-CM | POA: Diagnosis not present

## 2017-03-24 NOTE — Progress Notes (Signed)
Patient here for lab visit  

## 2017-03-24 NOTE — Progress Notes (Signed)
Subjective:  Patient ID: Rachel Everett, female    DOB: November 28, 1960  Age: 57 y.o. MRN: [id number]  CC: Gynecologic Exam   HPI Rachel Everett presents for well woman visit with pap. She reports family history of breast cancer. Mother - history of breast cancer twice 20 years apart; MGM- died from breast cancer. She denies  family history of  gynecological cancers. She denies any lumps, denting or dimpling of the breast, or nipple discharge. She does perform monthly SBE. She denies reports vaginal lesions, discharge, or dysuria. She declines to STI testing with pap.        Outpatient Medications Prior to Visit  Medication Sig Dispense Refill  . acetaminophen-codeine (TYLENOL #3) 300-30 MG tablet Take 1 tablet by mouth every 6 (six) hours as needed for severe pain. 40 tablet 0  . atorvastatin (LIPITOR) 20 MG tablet Take 1 tablet (20 mg total) by mouth daily. 30 tablet 2  . bisacodyl (DULCOLAX) 5 MG EC tablet Take 5 mg by mouth daily as needed for moderate constipation. Dulcolax 5 mg tab take as directed for colonoscopy prep.    . calcium carbonate (OS-CAL) 600 MG tablet Take 1 tablet (600 mg total) by mouth 2 (two) times daily with a meal. (Patient not taking: Reported on 11/21/2016) 60 tablet 3  . ciclopirox (PENLAC) 8 % solution Apply topically at bedtime. Apply over nail and surrounding skin. Apply daily over previous coat. After seven (7) days, may remove with alcohol and continue cycle. 6.6 mL 1  . famotidine (PEPCID) 20 MG tablet Take 1 tablet (20 mg total) by mouth 2 (two) times daily. 60 tablet 3  . hydrOXYzine (ATARAX/VISTARIL) 50 MG tablet Take 1 tablet (50 mg total) by mouth every 8 (eight) hours as needed for anxiety or itching. 40 tablet 0  . lamoTRIgine (LAMICTAL) 200 MG tablet Take 300 mg by mouth daily.   0  . levothyroxine (SYNTHROID, LEVOTHROID) 150 MCG tablet Take 1 tablet (150 mcg total) by mouth daily. 30 tablet 2  . lithium carbonate (LITHOBID) 300 MG CR tablet Take 300 mg by  mouth 3 (three) times daily.   0  . naproxen (NAPROSYN) 500 MG tablet Take 1 tablet (500 mg total) by mouth 2 (two) times daily as needed for moderate pain. Take with meals. 40 tablet 0  . nitrofurantoin, macrocrystal-monohydrate, (MACROBID) 100 MG capsule Take 1 capsule (100 mg total) by mouth 2 (two) times daily. (Patient not taking: Reported on 03/16/2017) 10 capsule 0  . omeprazole (PRILOSEC) 20 MG capsule Take 1 capsule (20 mg total) by mouth daily. 30 capsule 2  . polyethylene glycol powder (GLYCOLAX/MIRALAX) powder Take 1 Container by mouth once. Miralax 238 grams as directed for colonoscopy prep.    . predniSONE (DELTASONE) 10 MG tablet DAY 1: TAKE 6 TABLETS BY MOUTH WITH MEAL; DAY 2: TAKE 5 TABLETS; DAY 3: TAKE 4 TABLETS; DAY 4: TAKE 3 TABLETS; DAY 5: TAKE 2 TABLETS; DAY 6: TAKE 1 TABLET. 21 tablet 0  . QUEtiapine (SEROQUEL) 200 MG tablet Take 200 mg by mouth as needed.   0  . SUPREP BOWEL PREP KIT 17.5-3.13-1.6 GM/180ML SOLN Take 1 kit by mouth once. Brand Name Only.  No Substitutions.  Suprep Bowel Kit. (Patient not taking: Reported on 05/14/2016) 177 mL 0  . terbinafine (LAMISIL) 1 % cream Apply 1 application topically 2 (two) times daily. Apply to affected areas. For 14 days. 30 g 0  . Vitamin D, Ergocalciferol, (DRISDOL) 50000 units CAPS capsule Take 1  capsule (50,000 Units total) by mouth every 7 (seven) days. (Patient not taking: Reported on 05/14/2016) 12 capsule 0   No facility-administered medications prior to visit.     ROS Review of Systems  Constitutional: Negative.   Respiratory: Negative.   Cardiovascular: Negative.   Genitourinary: Negative.   Skin: Negative.    Objective:  BP 92/62 (BP Location: Left Arm, Patient Position: Sitting, Cuff Size: Normal)   Pulse 64   Temp 98.6 F (37 C) (Oral)   Resp 18   SpO2 99%   BP/Weight 03/24/2017 03/16/2017 11/21/2016  Systolic BP 92 108 114  Diastolic BP 62 74 77  Wt. (Lbs) - 171.8 164.2  BMI - 27.73 26.5     Physical  Exam  Constitutional: She appears well-developed and well-nourished.  Neck: No JVD present.  Cardiovascular: Normal rate, regular rhythm, normal heart sounds and intact distal pulses.  Pulmonary/Chest: Effort normal and breath sounds normal. Right breast exhibits no mass, no nipple discharge, no skin change and no tenderness. Left breast exhibits no mass, no nipple discharge and no tenderness.  Abdominal: Soft. Bowel sounds are normal.  Genitourinary: Vagina normal. No vaginal discharge found.  Skin: Skin is warm and dry.  Psychiatric: She has a normal mood and affect.  Nursing note and vitals reviewed.   Depression screen [facility] 2/9 03/24/2017 03/16/2017 11/21/2016  Decreased Interest 2 2 1   Down, Depressed, Hopeless 2 2 1   PHQ - 2 Score 4 4 2   Altered sleeping 1 2 3   Tired, decreased energy 2 3 3   Change in appetite 1 2 1   Feeling bad or failure about yourself  3 3 1   Trouble concentrating 1 3 1   Moving slowly or fidgety/restless 2 1 1   Suicidal thoughts 0 0 0  PHQ-9 Score 14 18 12   Difficult doing work/chores - - -    GAD 7 : Generalized Anxiety Score 03/24/2017 03/16/2017 11/21/2016 05/14/2016  Nervous, Anxious, on Edge 3 2 1 2   Control/stop worrying 3 3 2 2   Worry too much - different things 2 2 2 2   Trouble relaxing 2 3 2 2   Restless 1 1 1 1   Easily annoyed or irritable 3 1 1 1   Afraid - awful might happen 2 3 2 2   Total GAD 7 Score 16 15 11 12   Anxiety Difficulty - - - -       Assessment & Plan:   1. Well woman exam with routine gynecological exam  - Cytology - PAP([place])  2. Breast cancer screening  - [facility] SCREENING BREAST TOMO BILATERAL; Future  3. Family history of breast cancer in mother  - MM SCREENING BREAST TOMO BILATERAL; Future     Follow-up: Return As needed.   Lizbeth Bark FNP

## 2017-03-24 NOTE — Patient Instructions (Signed)
Breast Self-Awareness Breast self-awareness means:  Knowing how your breasts look.  Knowing how your breasts feel.  Checking your breasts every month for changes.  Telling your doctor if you notice a change in your breasts.  Breast self-awareness allows you to notice a breast problem early while it is still small. How to do a breast self-exam One way to learn what is normal for your breasts and to check for changes is to do a breast self-exam. To do a breast self-exam: Look for Changes  1. Take off all the clothes above your waist. 2. Stand in front of a mirror in a room with good lighting. 3. Put your hands on your hips. 4. Push your hands down. 5. Look at your breasts and nipples in the mirror to see if one breast or nipple looks different than the other. Check to see if: ? The shape of one breast is different. ? The size of one breast is different. ? There are wrinkles, dips, and bumps in one breast and not the other. 6. Look at each breast for changes in your skin, such as: ? Redness. ? Scaly areas. 7. Look for changes in your nipples, such as: ? Liquid around the nipples. ? Bleeding. ? Dimpling. ? Redness. ? A change in where the nipples are. Feel for Changes 1. Lie on your back on the floor. 2. Feel each breast. To do this, follow these steps: ? Pick a breast to feel. ? Put the arm closest to that breast above your head. ? Use your other arm to feel the nipple area of your breast. Feel the area with the pads of your three middle fingers by making small circles with your fingers. For the first circle, press lightly. For the second circle, press harder. For the third circle, press even harder. ? Keep making circles with your fingers at the light, harder, and even harder pressures as you move down your breast. Stop when you feel your ribs. ? Move your fingers a little toward the center of your body. ? Start making circles with your fingers again, this time going up until  you reach your collarbone. ? Keep making up and down circles until you reach your armpit. Remember to keep using the three pressures. ? Feel the other breast in the same way. 3. Sit or stand in the shower or tub. 4. With soapy water on your skin, feel each breast the same way you did in step 2, when you were lying on the floor. Write Down What You Find  After doing the self-exam, write down:  What is normal for each breast.  Any changes you find in each breast.  When you last had your period.  How often should I check my breasts? Check your breasts every month. If you are breastfeeding, the best time to check them is after you feed your baby or after you use a breast pump. If you get periods, the best time to check your breasts is 5-7 days after your period is over. When should I see my doctor? See your doctor if you notice:  A change in shape or size of your breasts or nipples.  A change in the skin of your breast or nipples, such as red or scaly skin.  Unusual fluid coming from your nipples.  A lump or thick area that was not there before.  Pain in your breasts.  Anything that concerns you.  This information is not intended to replace advice given to   you by your health care provider. Make sure you discuss any questions you have with your health care provider. Document Released: 08/20/2007 Document Revised: 08/09/2015 Document Reviewed: 01/21/2015 Elsevier Interactive Patient Education  2018 [employer] Pap Test Why am I having this test? A pap test is sometimes called a pap smear. It is a screening test that is used to check for signs of cancer of the vagina, cervix, and uterus. The test can also identify the presence of infection or precancerous changes. Your health care provider will likely recommend you have this test done on a regular basis. This test may be done:  Every 3 years, starting at age 21.  Every 5 years, in combination with testing for the presence of  human papillomavirus (HPV).  More or less often depending on other medical conditions.  What kind of sample is taken? Using a small cotton swab, plastic spatula, or brush, your health care provider will collect a sample of cells from the surface of your cervix. Your cervix is the opening to your uterus, also called a womb. Secretions from the cervix and vagina may also be collected. How do I prepare for this test?  Be aware of where you are in your menstrual cycle. You may be asked to reschedule the test if you are menstruating on the day of the test.  You may need to reschedule if you have a known vaginal infection on the day of the test.  You may be asked to avoid douching or taking a bath the day before or the day of the test.  Some medicines can cause abnormal test results, such as digitalis and tetracycline. Talk with your health care provider before your test if you take one of these medicines. What do the results mean? Abnormal test results may indicate a number of health conditions. These may include:  Cancer. Although pap test results cannot be used to diagnose cancer of the cervix, vagina, or uterus, they may suggest the possibility of cancer. Further tests would be required to determine if cancer is present.  Sexually transmitted disease.  Fungal infection.  Parasite infection.  Herpes infection.  A condition causing or contributing to infertility.  It is your responsibility to obtain your test results. Ask the lab or department performing the test when and how you will get your results. Contact your health care provider to discuss any questions you have about your results. Talk with your health care provider to discuss your results, treatment options, and if necessary, the need for more tests. Talk with your health care provider if you have any questions about your results. This information is not intended to replace advice given to you by your health care provider. Make  sure you discuss any questions you have with your health care provider. Document Released: 05/24/2002 Document Revised: 11/07/2015 Document Reviewed: 07/25/2013 Elsevier Interactive Patient Education  2018 [employer] Inc[employer]  

## 2017-03-25 ENCOUNTER — Telehealth: Payer: Self-pay | Admitting: Family Medicine

## 2017-03-25 ENCOUNTER — Other Ambulatory Visit: Payer: Self-pay | Admitting: Family Medicine

## 2017-03-25 DIAGNOSIS — E039 Hypothyroidism, unspecified: Secondary | ICD-10-CM

## 2017-03-25 LAB — T4 AND TSH
T4 TOTAL: 8.1 ug/dL (ref 4.5–12.0)
TSH: 23.09 u[IU]/mL — ABNORMAL HIGH (ref 0.450–4.500)

## 2017-03-25 MED ORDER — LEVOTHYROXINE SODIUM 175 MCG PO TABS: 175.0000 ug | ORAL_TABLET | Freq: Every day | ORAL | 1 refills | Status: DC

## 2017-03-25 MED FILL — LEVOTHYROXINE 175 MCG TAB: 175 | 30 days supply | Qty: 30 | Fill #0

## 2017-03-25 NOTE — Telephone Encounter (Signed)
Pt called very concern about her TSH since she check the result on line and is show that it was very high she would like to speak with you or the pcp to find out what she need to do, please follow up

## 2017-03-26 LAB — CYTOLOGY - PAP
Diagnosis: NEGATIVE
Diagnosis: REACTIVE
HPV: NOT DETECTED

## 2017-03-26 NOTE — Telephone Encounter (Signed)
Patient verified DOB Patient is aware of TSH level being abnormal and needing to pick up the increased dosage of levothyroxine and take daily. Patient is scheduled for 04/24/17 at 10:00 for TSH follow-up and recheck. No further questions at this time.

## 2017-03-26 NOTE — Telephone Encounter (Signed)
-----   Message from Lizbeth BarkMandesia R Hairston, FNP sent at 03/25/2017  1:52 PM EST ----- Thyroid function is not normal current dosage of levothyroxine. Levothyroxine will be increased. Take levothyroxine daily before breakfast. Recommend follow up in 6 weeks.

## 2017-03-27 ENCOUNTER — Ambulatory Visit: Payer: Medicare HMO | Admitting: Physical Therapy

## 2017-04-07 ENCOUNTER — Ambulatory Visit: Payer: Medicare HMO | Admitting: Physical Therapy

## 2017-04-09 ENCOUNTER — Other Ambulatory Visit: Payer: Self-pay | Admitting: Family Medicine

## 2017-04-09 DIAGNOSIS — E782 Mixed hyperlipidemia: Secondary | ICD-10-CM

## 2017-04-14 ENCOUNTER — Ambulatory Visit (INDEPENDENT_AMBULATORY_CARE_PROVIDER_SITE_OTHER): Payer: Medicare HMO | Admitting: Orthopaedic Surgery

## 2017-04-14 ENCOUNTER — Encounter (INDEPENDENT_AMBULATORY_CARE_PROVIDER_SITE_OTHER): Payer: Self-pay | Admitting: Orthopaedic Surgery

## 2017-04-14 VITALS — BP 117/78 | HR 68 | Ht 66.0 in | Wt 165.0 lb

## 2017-04-14 DIAGNOSIS — M5136 Other intervertebral disc degeneration, lumbar region: Secondary | ICD-10-CM

## 2017-04-14 NOTE — Progress Notes (Signed)
Office Visit Note   Patient: Rachel Everett           Date of Birth: [birth year]           MRN: [id number] Visit Date: 04/14/2017              Requested by: Lizbeth Bark, FNP [address] [place], [place] [address] PCP: Lizbeth Bark, FNP   Assessment & Plan: Visit Diagnoses:  1. Other intervertebral disc degeneration, lumbar region     Plan: Patient will proceed with physical therapy I will recheck her in 4 weeks if she is having persistent symptoms we will proceed with lumbar MRI scan with and without contrast.  She has chronic low back pain without nerve root tension signs and no weakness.  Follow-Up Instructions: Return in about 4 weeks (around 05/12/2017).   Orders:  No orders of the defined types were placed in this encounter.  No orders of the defined types were placed in this encounter.     Procedures: No procedures performed   Clinical Data: No additional findings.   Subjective: Chief Complaint  Patient presents with  . Lower Back - Pain  . Right Hip - Pain    HPI 57 year old female with low back pain for greater than a year that she rates at 5-9 on a 1-10 pain scale.  She states she has back pain radiates into buttocks but not down her legs.  She had previous L5-S1 lumbar laminectomy with discectomy in around 2013.  The surgery was done at [facility] and at that time she was living in [facility].  She is also had right distal radius fracture 1999 treated with a cast.  Left lateral epicondylar surgery on the elbow 2013.  Left ACL cadaveric allograft in the past as well as right ankle peroneal tendon surgery.  Patient states her pain is worse with sitting she has to lean back and slumped down in the chair.  Increased pain with forward flexion.  She gets relief with supine position.  She denies neurogenic claudication symptoms.  No associated bowel or bladder symptoms.  Patient states she is bipolar with Asperger's .  Patient states after back  surgery she did well for period of about 4-5 years and then started having pain 1 year ago which is gradually receding to the point where it is difficult for her to handle the pain.  She has her first physical therapy appointment coming up this.  Patient is disabled and does not work.  Week.  Review of Systems 14 point review of systems positive history of thyroid disease with hypothyroidism, vitamin D deficiency.  Previous lumbar laminectomy L5-S1, above listed orthopedic procedures.  Bipolar disorder on Lamictal, Seroquel, and lithium.  Otherwise negative as it pertains to HPI.   Objective: Vital Signs: BP 117/78   Pulse 68   Ht 5\' 6"  (1.676 m)   Wt 165 lb (74.8 kg)   BMI 26.63 kg/m   Physical Exam  Constitutional: She is oriented to person, place, and time. She appears well-developed.  HENT:  Head: Normocephalic.  Right Ear: External ear normal.  Left Ear: External ear normal.  Eyes: Pupils are equal, round, and reactive to light.  Neck: No tracheal deviation present. No thyromegaly present.  Cardiovascular: Normal rate.  Pulmonary/Chest: Effort normal.  Abdominal: Soft.  Neurological: She is alert and oriented to person, place, and time.  Skin: Skin is warm and dry.  Psychiatric: She has a normal mood and affect. Her  behavior is normal.    Ortho Exam has some symmetrical gluteus maximus atrophy.  Well-healed midline incision L5-S1.  Minimal trochanteric bursal tenderness mild sciatic notch tenderness.  Negative straight leg raising 90 degrees.  She is able heel and toe walk.  Well-healed lateral epicondylar incision full extension of the elbow on the left.  Left knee ACL incision with negative anterior drawer negative pivot shift.  Right lateral perineal incision nontender with negative ankle drawer.  Good perineal resistance.  Specialty Comments:  No specialty comments available.  Imaging: Previous plain radiographs demonstrate significant collapse of the L5-S1 level with  spurring.  Other levels show normal alignment and disc space height.   PMFS History: Patient Active Problem List   Diagnosis Date Noted  . Mixed hyperlipidemia 11/24/2016  . Diabetes mellitus screening 05/14/2016  . Vitamin D deficiency 05/14/2016  . Hypothyroidism 09/11/2015  . Thyroid disease 09/11/2014   Past Medical History:  Diagnosis Date  . Anxiety   . Bipolar 1 disorder (HCC)   . Depression   . GERD (gastroesophageal reflux disease)   . Hyperlipidemia   . SVD (spontaneous vaginal delivery)    x 1  . Thyroid disease     Family History  Problem Relation Age of Onset  . Cancer Mother   . Cancer Maternal Grandmother   . Colon cancer Neg Hx   . Esophageal cancer Neg Hx   . Rectal cancer Neg Hx   . Stomach cancer Neg Hx     Past Surgical History:  Procedure Laterality Date  . ANTERIOR CRUCIATE LIGAMENT REPAIR Left   . BACK SURGERY     L5-S1  . CHOLECYSTECTOMY    . FRACTURE SURGERY Left    elbow  . TENDON REPAIR Right    angle  . TONSILLECTOMY     Social History   Occupational History  . Not on file  Tobacco Use  . Smoking status: Former Smoker    Packs/day: 0.50    Years: 1.00    Pack years: 0.50    Types: Cigarettes    Last attempt to quit: 10/04/2015    Years since quitting: 1.5  . Smokeless tobacco: Never Used  Substance and Sexual Activity  . Alcohol use: Yes    Alcohol/week: 0.0 oz    Comment: socially  . Drug use: No  . Sexual activity: Yes    Birth control/protection: Post-menopausal

## 2017-04-15 ENCOUNTER — Encounter: Payer: Self-pay | Admitting: Physical Therapy

## 2017-04-15 ENCOUNTER — Ambulatory Visit: Payer: Medicare HMO | Attending: Family Medicine | Admitting: Physical Therapy

## 2017-04-15 ENCOUNTER — Other Ambulatory Visit: Payer: Self-pay

## 2017-04-15 DIAGNOSIS — M545 Low back pain, unspecified: Secondary | ICD-10-CM

## 2017-04-15 DIAGNOSIS — R262 Difficulty in walking, not elsewhere classified: Secondary | ICD-10-CM

## 2017-04-15 DIAGNOSIS — M25551 Pain in right hip: Secondary | ICD-10-CM

## 2017-04-15 DIAGNOSIS — G8929 Other chronic pain: Secondary | ICD-10-CM | POA: Insufficient documentation

## 2017-04-15 DIAGNOSIS — M6281 Muscle weakness (generalized): Secondary | ICD-10-CM

## 2017-04-15 NOTE — Therapy (Addendum)
[facility] [address] [place], [place], [address] Phone: [phone]   Fax:  [phone]  Physical Therapy Evaluation/Discharge Summary  Patient Details  Name: Rachel Everett MRN: [id number] Date of Birth: 1960/09/22 Referring Provider: Lizbeth Bark, FNP   Encounter Date: 04/15/2017  PT End of Session - 04/15/17 0936    Visit Number  1    Number of Visits  13    Date for PT Re-Evaluation  05/29/17    Authorization Type  [employer] MCR    PT Start Time  0931    PT Stop Time  1013    PT Time Calculation (min)  42 min    Activity Tolerance  Patient tolerated treatment well    Behavior During Therapy  [facility] for tasks assessed/performed       Past Medical History:  Diagnosis Date  . Anxiety   . Bipolar 1 disorder (HCC)   . Depression   . GERD (gastroesophageal reflux disease)   . Hyperlipidemia   . SVD (spontaneous vaginal delivery)    x 1  . Thyroid disease     Past Surgical History:  Procedure Laterality Date  . ANTERIOR CRUCIATE LIGAMENT REPAIR Left   . BACK SURGERY     L5-S1  . CHOLECYSTECTOMY    . FRACTURE SURGERY Left    elbow  . TENDON REPAIR Right    angle  . TONSILLECTOMY      There were no vitals filed for this visit.   Subjective Assessment - 04/15/17 0937    Subjective  Had L5-S1 diskectomy/Laminectomy 12 yr ago (pt reported) and I am having the same problems. Bilateral lower back pain. If I sleep more than 5 hr I wake up with terrible pain. there is one place in the house where I can sit with pillows. I don't exercise much so I don't sit up like I should. I can walk about 1/2 hr and then my hip starts giving out. Catching in Rt groin and it just goes out, began 2-3  mo ago of insidious onset when walking 2-3 blocks to store. Denies N/T into LE. Denies pain in Left hip. There is not much I can do without Rt ankle/Lt elbow bothering me. I have sprained by Rt ankle about 50 times.     How long can you  sit comfortably?  unable comfortably, a couple hours if she has pillows at home    How long can you walk comfortably?  30-45 min    Patient Stated Goals  decrease pain, improve muscle tone/exercise    Currently in Pain?  Yes    Pain Score  4     Pain Location  Back    Pain Orientation  Right;Lower    Pain Descriptors / Indicators  -- pinch    Pain Radiating Towards  rt hip/groin    Pain Onset  More than a month ago    Pain Frequency  Constant    Aggravating Factors   morning, sitting    Pain Relieving Factors  sit with pillows         [facility] PT Assessment - 04/15/17 0001      Assessment   Medical Diagnosis  LBP    Referring Provider  Lizbeth Bark, FNP    Onset Date/Surgical Date  -- back chronic, hip in last 2-3 mo    Hand Dominance  Right    Next MD Visit  -- 1 month    Prior Therapy  --  not this year, after back surgery      Precautions   Precautions  None      Restrictions   Weight Bearing Restrictions  No      Balance Screen   Has the patient fallen in the past 6 months  No      Home Environment   Living Environment  Private residence    Living Arrangements  Children    Additional Comments  8 steps to apartment      Prior Function   Level of Independence  Independent    Vocation  On disability      Cognition   Overall Cognitive Status  Within Functional Limits for tasks assessed      Observation/Other Assessments   Focus on Therapeutic Outcomes (FOTO)   -- episode d/c before eval      Sensation   Additional Comments  WFL      ROM / Strength   AROM / PROM / Strength  AROM;Strength      AROM   AROM Assessment Site  Lumbar    Lumbar Flexion  -- 10% with pain    Lumbar - Right Side Bend  -- no notable lumbar motion    Lumbar - Left Side Bend  -- no notable lumbar motion    Lumbar - Left Rotation  -- Limited vs Rt      Strength   Strength Assessment Site  Hip    Right/Left Hip  Right;Left    Right Hip Flexion  4-/5    Right Hip Extension  3+/5     Right Hip ABduction  4/5    Left Hip Flexion  4-/5 pain    Left Hip Extension  3+/5    Left Hip ABduction  4/5      Palpation   Palpation comment  denies TTP at [facility]             Objective measurements completed on examination: See above findings.      [facility] Adult PT Treatment/Exercise - 04/15/17 0001      Exercises   Exercises  Lumbar      Lumbar Exercises: Supine   AB Set Limitations  posterior pelvic tilt    Clam Limitations  green tband    Bridge with Ball Squeeze Limitations  mini bridge with ab set    Other Supine Lumbar Exercises  hooklying ball squeeze             PT Education - 04/15/17 0959    Education provided  Yes    Education Details  sleep position, ASO, anatomy of condition, POC, HEP, exercise form/rationale    Person(s) Educated  Patient    Methods  Explanation;Demonstration;Tactile cues;Verbal cues;Handout    Comprehension  Verbalized understanding;Need further instruction;Returned demonstration;Verbal cues required;Tactile cues required          PT Long Term Goals - 04/15/17 1328      PT LONG TERM GOAL #1   Title  Pt will be able to sit comfortably for at least 20 min    Baseline  unable comfortably at eval    Time  6    Period  Weeks    Status  New    Target Date  05/29/17      PT LONG TERM GOAL #2   Title  Gross Le strength to 5/5 for proper support to biomechanical chain    Baseline  see flowsheet    Time  6    Period  Weeks  Status  New    Target Date  05/29/17      PT LONG TERM GOAL #3   Title  Average LBP/hip pain <=3/10    Baseline  up to 7/10 on regular basis at eval    Time  6    Period  Weeks    Status  New    Target Date  05/29/17      PT LONG TERM GOAL #4   Title  Pt will be independent with long term HEP for continued care    Baseline  began establishing at eval    Time  6    Period  Weeks    Status  New    Target Date  05/29/17      PT LONG TERM GOAL #5   Title  pt will be able to ambulate as  needed for daily errands without "catching" in her hip    Baseline  frequent and feels that it is going to give out at eval    Time  6    Period  Weeks    Status  New    Target Date  05/29/17             Plan - 04/15/17 1014    Clinical Impression Statement  Pt presents to PT with complaints of LBP that is chronic with Rt hip pain beginning in last 2-3 months. Notable weakness along lumbopelvic and LE biomchanical chains resulting in poor support to joints. Pt with hyperextension of both knees and elbows with history of chronic ankle sprains indicating gross hypermobility. Pt will benefit from skilled PT to improve gross strength and stability and reach long term goals.     History and Personal Factors relevant to plan of care:  anxiety, bipolar, depression, chronic ankle sprains    Clinical Presentation  Evolving    Clinical Presentation due to:  recent increase in hip pain from chronic LBP    Clinical Decision Making  Moderate    Rehab Potential  Good    PT Frequency  2x / week    PT Duration  6 weeks    PT Treatment/Interventions  ADLs/Self Care Home Management;Cryotherapy;Electrical Stimulation;Ultrasound;Moist Heat;Traction;Iontophoresis 4mg /ml Dexamethasone;Gait training;Stair training;Functional mobility training;Therapeutic activities;Therapeutic exercise;Balance training;Patient/family education;Neuromuscular re-education;Manual techniques;Passive range of motion;Taping;Dry needling    PT Next Visit Plan  nu step, review abdominal engagement, gross LE strength    PT Home Exercise Plan  pelvic tilt, bridge, supine clam, supine ball squeeze    Consulted and Agree with Plan of Care  Patient       Patient will benefit from skilled therapeutic intervention in order to improve the following deficits and impairments:  Improper body mechanics, Pain, Postural dysfunction, Decreased activity tolerance, Decreased strength, Hypermobility, Difficulty walking  Visit Diagnosis: Chronic  bilateral low back pain without sciatica - Plan: PT plan of care cert/re-cert  Pain in right hip - Plan: PT plan of care cert/re-cert  Difficulty in walking, not elsewhere classified - Plan: PT plan of care cert/re-cert  Muscle weakness (generalized) - Plan: PT plan of care cert/re-cert     Problem List Patient Active Problem List   Diagnosis Date Noted  . Mixed hyperlipidemia 11/24/2016  . Diabetes mellitus screening 05/14/2016  . Vitamin D deficiency 05/14/2016  . Hypothyroidism 09/11/2015  . Thyroid disease 09/11/2014    Regginald Pask C. Carolyn Maniscalco PT, DPT 04/15/17 1:38 PM   [facility] Out[facility] Rehabilitation [facility] [address] [place], [place], [address] Phone: [phone]   Fax:  [phone]  Name: Rachel Everett MRN: [id number] Date of Birth: 1960-09-10  PHYSICAL THERAPY DISCHARGE SUMMARY  Visits from Start of Care: 1  Current functional level related to goals / functional outcomes: See above   Remaining deficits: See above   Education / Equipment: Anatomy of condition, POC, HEP, exercise form/rationale  Plan: Patient agrees to discharge.  Patient goals were not met. Patient is being discharged due to the patient's request.  ?????    Pt is moving.   Derricka Mertz C. Curt Oatis PT, DPT 05/19/17 9:53 AM

## 2017-04-20 ENCOUNTER — Ambulatory Visit: Payer: Medicare HMO | Admitting: Physical Therapy

## 2017-04-21 MED FILL — ATORVASTATIN 20 MG TABLET: 20 | 30 days supply | Qty: 30 | Fill #0

## 2017-04-21 MED FILL — LEVOTHYROXINE 175 MCG TAB: 175 | 30 days supply | Qty: 30 | Fill #1

## 2017-04-21 MED FILL — OMEPRAZOLE DR 20 MG CAPSULE: 20 | 30 days supply | Qty: 30 | Fill #1

## 2017-04-23 ENCOUNTER — Ambulatory Visit: Payer: Medicare HMO | Admitting: Family Medicine

## 2017-04-24 ENCOUNTER — Encounter: Payer: Self-pay | Admitting: Family Medicine

## 2017-04-24 ENCOUNTER — Ambulatory Visit: Payer: Medicare HMO | Attending: Family Medicine | Admitting: Family Medicine

## 2017-04-24 ENCOUNTER — Other Ambulatory Visit: Payer: Self-pay

## 2017-04-24 VITALS — BP 107/75 | HR 49 | Temp 98.5°F | Ht 66.0 in | Wt 159.0 lb

## 2017-04-24 DIAGNOSIS — R05 Cough: Secondary | ICD-10-CM | POA: Diagnosis not present

## 2017-04-24 DIAGNOSIS — E039 Hypothyroidism, unspecified: Secondary | ICD-10-CM | POA: Diagnosis not present

## 2017-04-24 DIAGNOSIS — Z20828 Contact with and (suspected) exposure to other viral communicable diseases: Secondary | ICD-10-CM | POA: Diagnosis not present

## 2017-04-24 DIAGNOSIS — Z79890 Hormone replacement therapy (postmenopausal): Secondary | ICD-10-CM | POA: Insufficient documentation

## 2017-04-24 DIAGNOSIS — Z79899 Other long term (current) drug therapy: Secondary | ICD-10-CM | POA: Insufficient documentation

## 2017-04-24 DIAGNOSIS — R001 Bradycardia, unspecified: Secondary | ICD-10-CM | POA: Diagnosis not present

## 2017-04-24 DIAGNOSIS — R509 Fever, unspecified: Secondary | ICD-10-CM | POA: Diagnosis present

## 2017-04-24 DIAGNOSIS — J3489 Other specified disorders of nose and nasal sinuses: Secondary | ICD-10-CM | POA: Diagnosis not present

## 2017-04-24 DIAGNOSIS — R5383 Other fatigue: Secondary | ICD-10-CM | POA: Diagnosis not present

## 2017-04-24 DIAGNOSIS — R55 Syncope and collapse: Secondary | ICD-10-CM | POA: Diagnosis not present

## 2017-04-24 DIAGNOSIS — J069 Acute upper respiratory infection, unspecified: Secondary | ICD-10-CM | POA: Diagnosis not present

## 2017-04-24 DIAGNOSIS — R6883 Chills (without fever): Secondary | ICD-10-CM | POA: Diagnosis not present

## 2017-04-24 MED ORDER — OXYMETAZOLINE HCL 0.05 % NA SOLN: 2.0000 | Freq: Two times a day (BID) | NASAL | 0 refills | Status: DC

## 2017-04-24 MED ORDER — BENZONATATE 100 MG PO CAPS: 100.0000 mg | ORAL_CAPSULE | Freq: Two times a day (BID) | ORAL | 0 refills | Status: DC | PRN

## 2017-04-24 MED ORDER — OSELTAMIVIR PHOSPHATE 75 MG PO CAPS: 75.0000 mg | ORAL_CAPSULE | Freq: Two times a day (BID) | ORAL | 0 refills | Status: DC

## 2017-04-24 MED ORDER — OXYMETAZOLINE HCL 0.05 % NA SOLN: 2.0000 | Freq: Two times a day (BID) | NASAL | 0 refills | Status: AC

## 2017-04-24 MED ORDER — OSELTAMIVIR PHOSPHATE 75 MG PO CAPS
75.0000 mg | ORAL_CAPSULE | Freq: Two times a day (BID) | ORAL | 0 refills | Status: DC
Start: 2017-04-24 — End: 2017-08-28

## 2017-04-24 MED ORDER — GUAIFENESIN-DM 100-10 MG/5ML PO SYRP: 5.0000 mL | ORAL_SOLUTION | ORAL | 0 refills | Status: DC | PRN

## 2017-04-24 MED FILL — ROBAFEN-DM SYRUP: 100-10 | 7 days supply | Qty: 236 | Fill #0

## 2017-04-24 NOTE — Progress Notes (Signed)
Subjective:  Patient ID: Rachel Everett, female    DOB: June 22, 1960  Age: 57 y.o. MRN: [id number]  CC: Fever (daughter had a positive nasal swab on wednesday); Cough (non productive); and Fatigue   HPI [name] presents for follow up for complains of non-productive coughing, rhinorrhea, headaches, fatigue, muscles aches, chills . Onset of symptoms a couple days ago. She reports recent sick contact. She reports her daughter had similar symptoms and was diagnosed with influenza A. Found to be bradycardiac in office. She reports episode of near syncope today and yesterday. She denies taking any cardiac medications. History of hypothyroidism. She denies weight changes, heat/cold intolerance, bowel/skin changes or palpitations or chest pain. She reports adherence with medication.           Outpatient Medications Prior to Visit  Medication Sig Dispense Refill  . atorvastatin (LIPITOR) 20 MG tablet TAKE 1 TABLET BY MOUTH DAILY. 30 tablet 2  . famotidine (PEPCID) 20 MG tablet Take 1 tablet (20 mg total) by mouth 2 (two) times daily. 60 tablet 3  . hydrOXYzine (ATARAX/VISTARIL) 50 MG tablet Take 1 tablet (50 mg total) by mouth every 8 (eight) hours as needed for anxiety or itching. 40 tablet 0  . lamoTRIgine (LAMICTAL) 200 MG tablet Take 300 mg by mouth daily.   0  . levothyroxine (SYNTHROID, LEVOTHROID) 175 MCG tablet Take 1 tablet (175 mcg total) by mouth daily before breakfast. 30 tablet 1  . lithium carbonate (LITHOBID) 300 MG CR tablet Take 300 mg by mouth 3 (three) times daily.   0  . naproxen (NAPROSYN) 500 MG tablet Take 1 tablet (500 mg total) by mouth 2 (two) times daily as needed for moderate pain. Take with meals. 40 tablet 0  . omeprazole (PRILOSEC) 20 MG capsule Take 1 capsule (20 mg total) by mouth daily. 30 capsule 2  . QUEtiapine (SEROQUEL) 200 MG tablet Take 200 mg by mouth as needed.   0  . acetaminophen-codeine (TYLENOL #3) 300-30 MG tablet Take 1 tablet by mouth every 6  (six) hours as needed for severe pain. (Patient not taking: Reported on 04/24/2017) 40 tablet 0  . calcium carbonate (OS-CAL) 600 MG tablet Take 1 tablet (600 mg total) by mouth 2 (two) times daily with a meal. (Patient not taking: Reported on 11/21/2016) 60 tablet 3  . ciclopirox (PENLAC) 8 % solution Apply topically at bedtime. Apply over nail and surrounding skin. Apply daily over previous coat. After seven (7) days, may remove with alcohol and continue cycle. (Patient not taking: Reported on 04/24/2017) 6.6 mL 1  . predniSONE (DELTASONE) 10 MG tablet DAY 1: TAKE 6 TABLETS BY MOUTH WITH MEAL; DAY 2: TAKE 5 TABLETS; DAY 3: TAKE 4 TABLETS; DAY 4: TAKE 3 TABLETS; DAY 5: TAKE 2 TABLETS; DAY 6: TAKE 1 TABLET. (Patient not taking: Reported on 04/24/2017) 21 tablet 0  . Vitamin D, Ergocalciferol, (DRISDOL) 50000 units CAPS capsule Take 1 capsule (50,000 Units total) by mouth every 7 (seven) days. (Patient not taking: Reported on 05/14/2016) 12 capsule 0  . bisacodyl (DULCOLAX) 5 MG EC tablet Take 5 mg by mouth daily as needed for moderate constipation. Dulcolax 5 mg tab take as directed for colonoscopy prep.    . nitrofurantoin, macrocrystal-monohydrate, (MACROBID) 100 MG capsule Take 1 capsule (100 mg total) by mouth 2 (two) times daily. (Patient not taking: Reported on 03/16/2017) 10 capsule 0  . polyethylene glycol powder (GLYCOLAX/MIRALAX) powder Take 1 Container by mouth once. Miralax 238 grams as directed for  colonoscopy prep.    Rolene Arbour BOWEL PREP KIT 17.5-3.13-1.6 GM/180ML SOLN Take 1 kit by mouth once. Brand Name Only.  No Substitutions.  Suprep Bowel Kit. (Patient not taking: Reported on 05/14/2016) 177 mL 0  . terbinafine (LAMISIL) 1 % cream Apply 1 application topically 2 (two) times daily. Apply to affected areas. For 14 days. 30 g 0   No facility-administered medications prior to visit.     ROS Review of Systems  Constitutional: Positive for chills and fatigue.  HENT: Positive for rhinorrhea.     Respiratory: Positive for cough.   Cardiovascular: Negative.   Musculoskeletal: Positive for myalgias.  Neurological: Positive for syncope (near ) and headaches.    Objective:  BP 107/75 (BP Location: Right Arm, Patient Position: Sitting, Cuff Size: Small)   Pulse (!) 49   Temp 98.5 F (36.9 C) (Oral)   Ht 5\' 6"  (1.676 m)   Wt 159 lb (72.1 kg)   SpO2 100%   BMI 25.66 kg/m   BP/Weight 04/24/2017 04/14/2017 03/24/2017  Systolic BP 107 117 92  Diastolic BP 75 78 62  Wt. (Lbs) 159 165 -  BMI 25.66 26.63 -     Physical Exam  Constitutional: She has a sickly appearance.  HENT:  Head: Normocephalic and atraumatic.  Left Ear: External ear normal.  Nose: Mucosal edema and rhinorrhea present.  Mouth/Throat: Oropharynx is clear and moist.  Eyes: Conjunctivae are normal. Pupils are equal, round, and reactive to light. Right eye exhibits no discharge. Left eye exhibits no discharge.  Neck: Normal range of motion. Neck supple. No thyromegaly present.  Cardiovascular: Normal rate, regular rhythm, normal heart sounds and intact distal pulses.  Pulmonary/Chest: Effort normal and breath sounds normal.  Abdominal: Soft. Bowel sounds are normal. There is no tenderness.  Lymphadenopathy:    She has cervical adenopathy (tenderness).  Skin: Skin is warm and dry.  Nursing note and vitals reviewed.   Assessment & Plan:   1. Upper respiratory tract infection, unspecified type   - Respiratory virus panel - guaiFENesin-dextromethorphan (ROBITUSSIN DM) 100-10 MG/5ML syrup; Take 5 mLs by mouth every 4 (four) hours as needed for cough.  Dispense: 236 mL; Refill: 0 - oxymetazoline (AFRIN 12 HOUR) 0.05 % nasal spray; Place 2 sprays into both nostrils 2 (two) times daily for 3 days.  Dispense: 30 mL; Refill: 0 - benzonatate (TESSALON) 100 MG capsule; Take 1 capsule (100 mg total) by mouth 2 (two) times daily as needed (severe cough).  Dispense: 20 capsule; Refill: 0  2. Exposure to the flu   -  Respiratory virus panel - oseltamivir (TAMIFLU) 75 MG capsule; Take 1 capsule (75 mg total) by mouth 2 (two) times daily.  Dispense: 10 capsule; Refill: 0  3. Hypothyroidism, unspecified type   - TSH  4. Bradycardia, unspecified No cardiac medications. Possibility symptoms could be related to acute illness w/ poor fluid intake. Will obtain EKG and labs to r/o any other underlying causes.  - EKG 12-Lead  5. Near syncope -Orthostatic VS  No cardiac medications. Possibility symptoms could be related to acute illness w/ poor fluid intake. Will obtain EKG and labs to r/o any other underlying causes.  - EKG 12-Lead - Basic metabolic panel - CBC      Follow-up: Return in about 3 months (around 07/22/2017), or if symptoms worsen or fail to improve, for Hypothyroidism.   Lizbeth Bark FNP

## 2017-04-24 NOTE — Patient Instructions (Signed)
Upper Respiratory Infection, Adult Most upper respiratory infections (URIs) are caused by a virus. A URI affects the nose, throat, and upper air passages. The most common type of URI is often called "the common cold." Follow these instructions at home:  Take medicines only as told by your doctor.  Gargle warm saltwater or take cough drops to comfort your throat as told by your doctor.  Use a warm mist humidifier or inhale steam from a shower to increase air moisture. This may make it easier to breathe.  Drink enough fluid to keep your pee (urine) clear or pale yellow.  Eat soups and other clear broths.  Have a healthy diet.  Rest as needed.  Go back to work when your fever is gone or your doctor says it is okay. ? You may need to stay home longer to avoid giving your URI to others. ? You can also wear a face mask and wash your hands often to prevent spread of the virus.  Use your inhaler more if you have asthma.  Do not use any tobacco products, including cigarettes, chewing tobacco, or electronic cigarettes. If you need help quitting, ask your doctor. Contact a doctor if:  You are getting worse, not better.  Your symptoms are not helped by medicine.  You have chills.  You are getting more short of breath.  You have brown or red mucus.  You have yellow or brown discharge from your nose.  You have pain in your face, especially when you bend forward.  You have a fever.  You have puffy (swollen) neck glands.  You have pain while swallowing.  You have white areas in the back of your throat. Get help right away if:  You have very bad or constant: ? Headache. ? Ear pain. ? Pain in your forehead, behind your eyes, and over your cheekbones (sinus pain). ? Chest pain.  You have long-lasting (chronic) lung disease and any of the following: ? Wheezing. ? Long-lasting cough. ? Coughing up blood. ? A change in your usual mucus.  You have a stiff neck.  You have  changes in your: ? Vision. ? Hearing. ? Thinking. ? Mood. This information is not intended to replace advice given to you by your health care provider. Make sure you discuss any questions you have with your health care provider. Document Released: 08/20/2007 Document Revised: 11/04/2015 Document Reviewed: 06/08/2013 Elsevier Interactive Patient Education  2018 [employer] Inc[employer]  

## 2017-04-28 ENCOUNTER — Encounter: Payer: Medicare HMO | Admitting: Physical Therapy

## 2017-04-28 LAB — RESPIRATORY VIRUS PANEL
ADENOVIRUS: NEGATIVE
Influenza A: POSITIVE — AB
Influenza B: NEGATIVE
METAPNEUMOVIRUS: NEGATIVE
PARAINFLUENZA 3 A: NEGATIVE
Parainfluenza 1: NEGATIVE
Parainfluenza 2: NEGATIVE
RHINOVIRUS: NEGATIVE
Respiratory Syncytial Virus A: NEGATIVE
Respiratory Syncytial Virus B: NEGATIVE

## 2017-04-30 ENCOUNTER — Telehealth (INDEPENDENT_AMBULATORY_CARE_PROVIDER_SITE_OTHER): Payer: Self-pay | Admitting: *Deleted

## 2017-04-30 NOTE — Telephone Encounter (Signed)
-----   Message from Lizbeth BarkMandesia R Hairston, FNP sent at 04/29/2017 11:51 AM EST ----- Influenza A is positive. Continue to take your Tamiflu as prescribed. Keep well hydrated. Avoid close contact with others during illness and handwashing to prevent spread. Most people feel an improvement of flu symptoms in 1 to 2 weeks. If you start to develop shortness or breath, dizziness, or severe vomiting call to follow up.

## 2017-04-30 NOTE — Telephone Encounter (Signed)
Medical Assistant left message on patient's home and cell voicemail. Voicemail states to give a call back to [place] with [facility] at [phone]. Patient is aware of flu being positive and needing to continue with tamiflu. Patient advised to contact the office if SOB, vomiting occurs in a severe manner.

## 2017-05-01 ENCOUNTER — Encounter: Payer: Medicare HMO | Admitting: Physical Therapy

## 2017-05-05 ENCOUNTER — Encounter: Payer: Medicare HMO | Admitting: Physical Therapy

## 2017-05-08 ENCOUNTER — Encounter: Payer: Medicare HMO | Admitting: Physical Therapy

## 2017-05-12 ENCOUNTER — Encounter: Payer: Medicare HMO | Admitting: Physical Therapy

## 2017-05-15 ENCOUNTER — Encounter: Payer: Medicare HMO | Admitting: Physical Therapy

## 2017-05-19 ENCOUNTER — Ambulatory Visit (INDEPENDENT_AMBULATORY_CARE_PROVIDER_SITE_OTHER): Payer: Medicare HMO | Admitting: Orthopaedic Surgery

## 2017-05-19 ENCOUNTER — Encounter: Payer: Medicare HMO | Admitting: Physical Therapy

## 2017-05-22 ENCOUNTER — Encounter: Payer: Medicare HMO | Admitting: Physical Therapy

## 2017-05-26 ENCOUNTER — Encounter: Payer: Medicare HMO | Admitting: Physical Therapy

## 2017-05-27 MED FILL — OMEPRAZOLE DR 20 MG CAPSULE: 20 | 30 days supply | Qty: 30 | Fill #2

## 2017-05-27 MED FILL — ATORVASTATIN 20 MG TABLET: 20 | 30 days supply | Qty: 30 | Fill #1

## 2017-05-27 MED FILL — LEVOTHYROXINE 175 MCG TAB: 175 | 30 days supply | Qty: 30 | Fill #0

## 2017-05-29 ENCOUNTER — Encounter: Payer: Medicare HMO | Admitting: Physical Therapy

## 2017-06-02 ENCOUNTER — Encounter: Payer: Medicare HMO | Admitting: Physical Therapy

## 2017-06-05 ENCOUNTER — Encounter: Payer: Medicare HMO | Admitting: Physical Therapy

## 2017-06-26 MED FILL — ATORVASTATIN 20 MG TABLET: 20 | 30 days supply | Qty: 30 | Fill #2

## 2017-06-26 MED FILL — LEVOTHYROXINE 175 MCG TAB: 175 | 30 days supply | Qty: 30 | Fill #1

## 2017-06-30 DIAGNOSIS — F3132 Bipolar disorder, current episode depressed, moderate: Secondary | ICD-10-CM | POA: Diagnosis not present

## 2017-08-13 ENCOUNTER — Other Ambulatory Visit: Payer: Self-pay

## 2017-08-13 DIAGNOSIS — E039 Hypothyroidism, unspecified: Secondary | ICD-10-CM

## 2017-08-13 DIAGNOSIS — K219 Gastro-esophageal reflux disease without esophagitis: Secondary | ICD-10-CM

## 2017-08-13 DIAGNOSIS — E782 Mixed hyperlipidemia: Secondary | ICD-10-CM

## 2017-08-13 MED ORDER — ATORVASTATIN CALCIUM 20 MG PO TABS: 20.0000 mg | ORAL_TABLET | Freq: Every day | ORAL | 0 refills | Status: DC

## 2017-08-13 MED ORDER — LEVOTHYROXINE SODIUM 175 MCG PO TABS: 175.0000 ug | ORAL_TABLET | Freq: Every day | ORAL | 0 refills | Status: DC

## 2017-08-13 MED ORDER — OMEPRAZOLE 20 MG PO CPDR: 20.0000 mg | DELAYED_RELEASE_CAPSULE | Freq: Every day | ORAL | 0 refills | Status: DC

## 2017-08-13 MED FILL — OMEPRAZOLE 20 MG CAP: 20 | 30 days supply | Qty: 30 | Fill #0

## 2017-08-13 MED FILL — ATORVASTATIN 20 MG TABLET: 20 | 30 days supply | Qty: 30 | Fill #0

## 2017-08-13 MED FILL — LEVOTHYROXINE 175 MCG TAB: 175 | 30 days supply | Qty: 30 | Fill #0

## 2017-08-18 DIAGNOSIS — F3132 Bipolar disorder, current episode depressed, moderate: Secondary | ICD-10-CM | POA: Diagnosis not present

## 2017-08-28 ENCOUNTER — Encounter: Payer: Self-pay | Admitting: Nurse Practitioner

## 2017-08-28 ENCOUNTER — Ambulatory Visit: Payer: Medicare HMO | Attending: Nurse Practitioner | Admitting: Nurse Practitioner

## 2017-08-28 VITALS — BP 112/78 | HR 67 | Temp 98.2°F | Ht 66.0 in | Wt 162.8 lb

## 2017-08-28 DIAGNOSIS — Z79890 Hormone replacement therapy (postmenopausal): Secondary | ICD-10-CM | POA: Diagnosis not present

## 2017-08-28 DIAGNOSIS — F319 Bipolar disorder, unspecified: Secondary | ICD-10-CM | POA: Diagnosis not present

## 2017-08-28 DIAGNOSIS — Z9889 Other specified postprocedural states: Secondary | ICD-10-CM | POA: Insufficient documentation

## 2017-08-28 DIAGNOSIS — E039 Hypothyroidism, unspecified: Secondary | ICD-10-CM | POA: Diagnosis not present

## 2017-08-28 DIAGNOSIS — M545 Low back pain: Secondary | ICD-10-CM | POA: Insufficient documentation

## 2017-08-28 DIAGNOSIS — Z760 Encounter for issue of repeat prescription: Secondary | ICD-10-CM | POA: Diagnosis not present

## 2017-08-28 DIAGNOSIS — E785 Hyperlipidemia, unspecified: Secondary | ICD-10-CM | POA: Insufficient documentation

## 2017-08-28 DIAGNOSIS — Z79899 Other long term (current) drug therapy: Secondary | ICD-10-CM | POA: Diagnosis not present

## 2017-08-28 DIAGNOSIS — E782 Mixed hyperlipidemia: Secondary | ICD-10-CM | POA: Insufficient documentation

## 2017-08-28 DIAGNOSIS — K219 Gastro-esophageal reflux disease without esophagitis: Secondary | ICD-10-CM | POA: Insufficient documentation

## 2017-08-28 DIAGNOSIS — R799 Abnormal finding of blood chemistry, unspecified: Secondary | ICD-10-CM | POA: Insufficient documentation

## 2017-08-28 DIAGNOSIS — R899 Unspecified abnormal finding in specimens from other organs, systems and tissues: Secondary | ICD-10-CM | POA: Diagnosis not present

## 2017-08-28 DIAGNOSIS — Z9049 Acquired absence of other specified parts of digestive tract: Secondary | ICD-10-CM | POA: Diagnosis not present

## 2017-08-28 DIAGNOSIS — F419 Anxiety disorder, unspecified: Secondary | ICD-10-CM | POA: Insufficient documentation

## 2017-08-28 DIAGNOSIS — F3162 Bipolar disorder, current episode mixed, moderate: Secondary | ICD-10-CM | POA: Diagnosis not present

## 2017-08-28 MED ORDER — OMEPRAZOLE 20 MG PO CPDR: 20.0000 mg | DELAYED_RELEASE_CAPSULE | Freq: Every day | ORAL | 0 refills | Status: DC

## 2017-08-28 MED ORDER — ATORVASTATIN CALCIUM 20 MG PO TABS: 20.0000 mg | ORAL_TABLET | Freq: Every day | ORAL | 0 refills | Status: DC

## 2017-08-28 MED ORDER — LEVOTHYROXINE SODIUM 175 MCG PO TABS: 175.0000 ug | ORAL_TABLET | Freq: Every day | ORAL | 0 refills | Status: DC

## 2017-08-28 NOTE — Progress Notes (Signed)
 Assessment & Plan:  Rachel Everett was seen today for establish care and medication refill.  Diagnoses and all orders for this visit:  Abnormal laboratory test result -     CMP14+EGFR  Mixed hyperlipidemia Chronic. Endorses medication compliance.  -     atorvastatin (LIPITOR) 20 MG tablet; Take 1 tablet (20 mg total) by mouth daily. INSTRUCTIONS: Work on a low fat, heart healthy diet and participate in regular aerobic exercise program by working out at least 150 minutes per week. No fried foods. No junk foods, sodas, sugary drinks, unhealthy snacking, alcohol or smoking.    Gastroesophageal reflux disease, esophagitis presence not specified -     omeprazole (PRILOSEC) 20 MG capsule; Take 1 capsule (20 mg total) by mouth daily. INSTRUCTIONS: Avoid GERD Triggers: acidic, spicy or fried foods, caffeine, coffee, sodas,  alcohol and chocolate.   Acquired hypothyroidism -     levothyroxine (SYNTHROID, LEVOTHROID) 175 MCG tablet; Take 1 tablet (175 mcg total) by mouth daily before breakfast. -     TSH    Patient has been counseled on age-appropriate routine health concerns for screening and prevention. These are reviewed and up-to-date. Referrals have been placed accordingly. Immunizations are up-to-date or declined.    Subjective:   Chief Complaint  Patient presents with  . Establish Care    Pt. is here establish care for thryroid and cholesterol.   . Medication Refill   HPI Rachel Everett 56 y.o. female presents to office today to establish care.    Bipolar 1 disorder, anxiety and depression Currently being managed by a psych NP. She has requested I draw her lithium levels today. I have referred her back to the Psych provider to order her labs and follow up with her levels related to the medications he is prescribing. She verbalized understanding.  She endorses increased fine hand tremors and thinks these may be coming from her psych medications as she recently had one of her medications  adjusted.   Hypothyroidism Patient presents for follow up of her hypothyroidism. Symptoms consist of tremulousness, sudden headaches. Symptoms have present for a few weeks. The symptoms are moderate.  The problem has been unchanged.  Previous thyroid studies include TSH and free thyroxine. The hypothyroidism is due to hypothyroidism. She also endorses a severe headache when she bent over to pick something up 6 weeks ago. It lasted for 20 minutes and then resolved on its own. She had a reoccurrence of the same headache a few days later which resolved on its own. She denied any focal neurological deficits or stroke like symptoms during the headache occurrences or after.   Chronic Low Back Pain She has seen Ortho in the past for lumbar disc degeneration and was instructed to have physical therapy for 4 weeks however she did not follow up with physical therapy as instructed and states she could not afford the copays. Today she reports her low back pain is  tolerable with spare use of NSAIDs. She denies any involuntary loss of bowel or bladder  She has a past surgical history of L5-S1 lumbar laminectomy with discectomy over 6 years ago. Aggravating factors: prolonged sitting.    Review of Systems  Constitutional: Negative for fever, malaise/fatigue and weight loss.  HENT: Negative.  Negative for nosebleeds.   Eyes: Negative.  Negative for blurred vision, double vision and photophobia.  Respiratory: Negative.  Negative for cough and shortness of breath.   Cardiovascular: Negative.  Negative for chest pain, palpitations and leg swelling.  Gastrointestinal: Positive   for heartburn. Negative for nausea and vomiting.  Musculoskeletal: Positive for back pain. Negative for myalgias.  Neurological: Positive for tremors and headaches. Negative for dizziness, sensory change, speech change, focal weakness, seizures and weakness.  Psychiatric/Behavioral: Positive for depression. Negative for suicidal ideas. The  patient is nervous/anxious.     Past Medical History:  Diagnosis Date  . Anxiety   . Bipolar 1 disorder (HCC)   . Depression   . GERD (gastroesophageal reflux disease)   . Hyperlipidemia   . SVD (spontaneous vaginal delivery)    x 1  . Thyroid disease     Past Surgical History:  Procedure Laterality Date  . ANKLE SURGERY     right   . ANTERIOR CRUCIATE LIGAMENT REPAIR Left   . BACK SURGERY     L5-S1  . CHOLECYSTECTOMY    . FRACTURE SURGERY Left    elbow  . TENDON REPAIR Right    angle  . TONSILLECTOMY      Family History  Problem Relation Age of Onset  . Cancer Mother   . Cancer Maternal Grandmother   . Colon cancer Neg Hx   . Esophageal cancer Neg Hx   . Rectal cancer Neg Hx   . Stomach cancer Neg Hx     Social History Reviewed with no changes to be made today.   Outpatient Medications Prior to Visit  Medication Sig Dispense Refill  . lamoTRIgine (LAMICTAL) 200 MG tablet Take 300 mg by mouth daily.   0  . lithium carbonate (LITHOBID) 300 MG CR tablet Take 300 mg by mouth 3 (three) times daily.   0  . QUEtiapine (SEROQUEL) 200 MG tablet Take 200 mg by mouth as needed.   0  . atorvastatin (LIPITOR) 20 MG tablet Take 1 tablet (20 mg total) by mouth daily. 30 tablet 0  . levothyroxine (SYNTHROID, LEVOTHROID) 175 MCG tablet Take 1 tablet (175 mcg total) by mouth daily before breakfast. 30 tablet 0  . omeprazole (PRILOSEC) 20 MG capsule Take 1 capsule (20 mg total) by mouth daily. 30 capsule 0  . famotidine (PEPCID) 20 MG tablet Take 1 tablet (20 mg total) by mouth 2 (two) times daily. 60 tablet 3  . naproxen (NAPROSYN) 500 MG tablet Take 1 tablet (500 mg total) by mouth 2 (two) times daily as needed for moderate pain. Take with meals. (Patient not taking: Reported on 08/28/2017) 40 tablet 0  . acetaminophen-codeine (TYLENOL #3) 300-30 MG tablet Take 1 tablet by mouth every 6 (six) hours as needed for severe pain. (Patient not taking: Reported on 04/24/2017) 40 tablet 0    . benzonatate (TESSALON) 100 MG capsule Take 1 capsule (100 mg total) by mouth 2 (two) times daily as needed (severe cough). (Patient not taking: Reported on 08/28/2017) 20 capsule 0  . calcium carbonate (OS-CAL) 600 MG tablet Take 1 tablet (600 mg total) by mouth 2 (two) times daily with a meal. (Patient not taking: Reported on 11/21/2016) 60 tablet 3  . ciclopirox (PENLAC) 8 % solution Apply topically at bedtime. Apply over nail and surrounding skin. Apply daily over previous coat. After seven (7) days, may remove with alcohol and continue cycle. (Patient not taking: Reported on 04/24/2017) 6.6 mL 1  . guaiFENesin-dextromethorphan (ROBITUSSIN DM) 100-10 MG/5ML syrup Take 5 mLs by mouth every 4 (four) hours as needed for cough. (Patient not taking: Reported on 08/28/2017) 236 mL 0  . hydrOXYzine (ATARAX/VISTARIL) 50 MG tablet Take 1 tablet (50 mg total) by mouth every 8 (eight)   hours as needed for anxiety or itching. (Patient not taking: Reported on 08/28/2017) 40 tablet 0  . oseltamivir (TAMIFLU) 75 MG capsule Take 1 capsule (75 mg total) by mouth 2 (two) times daily. (Patient not taking: Reported on 08/28/2017) 10 capsule 0  . predniSONE (DELTASONE) 10 MG tablet DAY 1: TAKE 6 TABLETS BY MOUTH WITH MEAL; DAY 2: TAKE 5 TABLETS; DAY 3: TAKE 4 TABLETS; DAY 4: TAKE 3 TABLETS; DAY 5: TAKE 2 TABLETS; DAY 6: TAKE 1 TABLET. (Patient not taking: Reported on 04/24/2017) 21 tablet 0  . Vitamin D, Ergocalciferol, (DRISDOL) 50000 units CAPS capsule Take 1 capsule (50,000 Units total) by mouth every 7 (seven) days. (Patient not taking: Reported on 05/14/2016) 12 capsule 0   No facility-administered medications prior to visit.     No Known Allergies     Objective:    BP 112/78 (BP Location: Right Arm, Patient Position: Sitting, Cuff Size: Normal)   Pulse 67   Temp 98.2 F (36.8 C) (Oral)   Ht 5' 6" (1.676 m)   Wt 162 lb 12.8 oz (73.8 kg)   SpO2 98%   BMI 26.28 kg/m  Wt Readings from Last 3 Encounters:  08/28/17  162 lb 12.8 oz (73.8 kg)  04/24/17 159 lb (72.1 kg)  04/14/17 165 lb (74.8 kg)    Physical Exam  Constitutional: She is oriented to person, place, and time. She appears well-developed and well-nourished. She is cooperative.  HENT:  Head: Normocephalic and atraumatic.  Eyes: EOM are normal.  Neck: Normal range of motion.  Cardiovascular: Normal rate, regular rhythm and normal heart sounds. Exam reveals no gallop and no friction rub.  No murmur heard. Pulmonary/Chest: Effort normal and breath sounds normal. No tachypnea. No respiratory distress. She has no decreased breath sounds. She has no wheezes. She has no rhonchi. She has no rales. She exhibits no tenderness.  Abdominal: Bowel sounds are normal.  Musculoskeletal: Normal range of motion. She exhibits no edema.  Neurological: She is alert and oriented to person, place, and time. She displays no tremor. Coordination normal.  Skin: Skin is warm and dry.  Psychiatric: She has a normal mood and affect. Her behavior is normal. Judgment and thought content normal.  Nursing note and vitals reviewed.      Patient has been counseled extensively about nutrition and exercise as well as the importance of adherence with medications and regular follow-up. The patient was given clear instructions to go to ER or return to medical center if symptoms don't improve, worsen or new problems develop. The patient verbalized understanding.   Follow-up: Return in about 3 months (around 11/28/2017) for thyroid/back pain.   Gildardo Pounds, FNP-BC [facility] [place], [place]   08/28/2017, 6:22 PM

## 2017-08-29 LAB — CMP14+EGFR
ALBUMIN: 4.5 g/dL (ref 3.5–5.5)
ALK PHOS: 128 IU/L — AB (ref 39–117)
ALT: 21 IU/L (ref 0–32)
AST: 14 IU/L (ref 0–40)
Albumin/Globulin Ratio: 2 (ref 1.2–2.2)
BUN / CREAT RATIO: 18 (ref 9–23)
BUN: 15 mg/dL (ref 6–24)
Bilirubin Total: <0.2 mg/dL (ref 0.0–1.2)
CO2: 22 mmol/L (ref 20–29)
CREATININE: 0.82 mg/dL (ref 0.57–1.00)
Calcium: 10.2 mg/dL (ref 8.7–10.2)
Chloride: 103 mmol/L (ref 96–106)
GFR calc Af Amer: 92 mL/min/{1.73_m2} (ref >59)
GFR calc non Af Amer: 80 mL/min/{1.73_m2} (ref >59)
GLUCOSE: 101 mg/dL — AB (ref 65–99)
Globulin, Total: 2.3 g/dL (ref 1.5–4.5)
Potassium: 4.6 mmol/L (ref 3.5–5.2)
Sodium: 140 mmol/L (ref 134–144)
TOTAL PROTEIN: 6.8 g/dL (ref 6.0–8.5)

## 2017-08-29 LAB — TSH: TSH: 0.023 u[IU]/mL — ABNORMAL LOW (ref 0.450–4.500)

## 2017-09-01 ENCOUNTER — Other Ambulatory Visit: Payer: Self-pay | Admitting: Nurse Practitioner

## 2017-09-01 MED ORDER — LEVOTHYROXINE SODIUM 150 MCG PO TABS: 150.0000 ug | ORAL_TABLET | Freq: Every day | ORAL | 1 refills | Status: DC

## 2017-09-02 MED FILL — LEVOTHYROXINE 150 MCG TAB: 150 | 30 days supply | Qty: 30 | Fill #0

## 2017-10-08 MED FILL — ATORVASTATIN 20 MG TABLET: 20 | 90 days supply | Qty: 90 | Fill #0

## 2017-10-08 MED FILL — OMEPRAZOLE 20 MG CAP: 20 | 90 days supply | Qty: 90 | Fill #0

## 2017-10-08 MED FILL — LEVOTHYROXINE 150 MCG TAB: 150 | 30 days supply | Qty: 30 | Fill #1

## 2017-10-23 ENCOUNTER — Ambulatory Visit: Payer: Medicare HMO | Admitting: Nurse Practitioner

## 2017-10-29 DIAGNOSIS — F3132 Bipolar disorder, current episode depressed, moderate: Secondary | ICD-10-CM | POA: Diagnosis not present

## 2017-11-23 ENCOUNTER — Other Ambulatory Visit: Payer: Self-pay | Admitting: Nurse Practitioner

## 2017-11-23 DIAGNOSIS — E782 Mixed hyperlipidemia: Secondary | ICD-10-CM

## 2017-11-27 ENCOUNTER — Ambulatory Visit: Payer: Medicare HMO | Admitting: Nurse Practitioner

## 2017-12-10 MED FILL — LEVOTHYROXINE 175 MCG TAB: 175 | 30 days supply | Qty: 30 | Fill #0

## 2018-01-08 ENCOUNTER — Other Ambulatory Visit: Payer: Medicare HMO

## 2018-01-12 ENCOUNTER — Other Ambulatory Visit: Payer: Medicare HMO

## 2018-01-13 ENCOUNTER — Ambulatory Visit: Payer: Medicare HMO | Attending: Family Medicine

## 2018-01-13 ENCOUNTER — Other Ambulatory Visit: Payer: Self-pay | Admitting: Nurse Practitioner

## 2018-01-13 DIAGNOSIS — E039 Hypothyroidism, unspecified: Secondary | ICD-10-CM | POA: Diagnosis not present

## 2018-01-14 ENCOUNTER — Other Ambulatory Visit: Payer: Self-pay | Admitting: Nurse Practitioner

## 2018-01-14 LAB — TSH: TSH: 0.053 u[IU]/mL — ABNORMAL LOW (ref 0.450–4.500)

## 2018-01-14 MED ORDER — LEVOTHYROXINE SODIUM 125 MCG PO TABS: 125.0000 ug | ORAL_TABLET | Freq: Every day | ORAL | 2 refills | Status: DC

## 2018-01-15 MED FILL — ATORVASTATIN 20 MG TABLET: 20 | 30 days supply | Qty: 30 | Fill #0

## 2018-01-15 MED FILL — LEVOTHYROXINE 125 MCG TAB: 125 | 30 days supply | Qty: 30 | Fill #0

## 2018-02-03 ENCOUNTER — Telehealth: Payer: Self-pay | Admitting: Nurse Practitioner

## 2018-02-03 NOTE — Telephone Encounter (Signed)
Patient called to see [facility]. Please follow up with patient.

## 2018-02-09 NOTE — Telephone Encounter (Signed)
CMA attempt to call patient back. No answer and left a VM for a callback.

## 2018-02-10 NOTE — Telephone Encounter (Signed)
Patient called to get her results. Please follow up with patient.

## 2018-02-15 ENCOUNTER — Other Ambulatory Visit: Payer: Self-pay | Admitting: Nurse Practitioner

## 2018-02-15 DIAGNOSIS — K219 Gastro-esophageal reflux disease without esophagitis: Secondary | ICD-10-CM

## 2018-02-15 DIAGNOSIS — E039 Hypothyroidism, unspecified: Secondary | ICD-10-CM

## 2018-02-15 DIAGNOSIS — E782 Mixed hyperlipidemia: Secondary | ICD-10-CM

## 2018-02-15 MED FILL — LEVOTHYROXINE 125 MCG TAB: 125 | 30 days supply | Qty: 30 | Fill #1

## 2018-02-15 NOTE — Telephone Encounter (Signed)
Patient called to check and see if her PCP received orders form [facility] to have blood drawn. Please f/u

## 2018-02-16 MED FILL — OMEPRAZOLE 20 MG CAPSULE DR: 20 | 90 days supply | Qty: 90 | Fill #0

## 2018-02-16 MED FILL — ATORVASTATIN 20 MG TABLET: 20 | 30 days supply | Qty: 30 | Fill #0

## 2018-02-17 NOTE — Telephone Encounter (Signed)
Patient was inform to have [facility] re-faxed the order to have the blood drawn.   Pt. Understood.

## 2018-02-22 NOTE — Telephone Encounter (Signed)
I do not have any labs. She can obtain a print out of what she needs and make her own lab appointment.

## 2018-02-22 NOTE — Telephone Encounter (Signed)
Pt called to follow up on her labs that she needs to have, She states that [facility] has faxed them over twice.   Please follow up

## 2018-03-01 ENCOUNTER — Other Ambulatory Visit: Payer: Medicare HMO

## 2018-03-08 ENCOUNTER — Ambulatory Visit: Payer: Medicare HMO | Attending: Family Medicine

## 2018-03-08 DIAGNOSIS — E039 Hypothyroidism, unspecified: Secondary | ICD-10-CM | POA: Diagnosis not present

## 2018-03-08 DIAGNOSIS — F3132 Bipolar disorder, current episode depressed, moderate: Secondary | ICD-10-CM | POA: Diagnosis not present

## 2018-03-08 NOTE — Progress Notes (Signed)
Patient here for lab only 

## 2018-03-16 DIAGNOSIS — F3132 Bipolar disorder, current episode depressed, moderate: Secondary | ICD-10-CM | POA: Diagnosis not present

## 2018-03-23 ENCOUNTER — Other Ambulatory Visit: Payer: Self-pay | Admitting: Family Medicine

## 2018-03-23 DIAGNOSIS — E782 Mixed hyperlipidemia: Secondary | ICD-10-CM

## 2018-03-23 MED FILL — LEVOTHYROXINE 125 MCG TAB: 125 | 30 days supply | Qty: 30 | Fill #2

## 2018-03-24 MED FILL — ATORVASTATIN 20 MG TABLET: 20 | 30 days supply | Qty: 30 | Fill #0

## 2018-03-30 ENCOUNTER — Encounter: Payer: Medicare HMO | Admitting: Nurse Practitioner

## 2018-04-19 ENCOUNTER — Other Ambulatory Visit: Payer: Self-pay | Admitting: Family Medicine

## 2018-04-19 ENCOUNTER — Other Ambulatory Visit: Payer: Self-pay | Admitting: Nurse Practitioner

## 2018-04-19 DIAGNOSIS — K219 Gastro-esophageal reflux disease without esophagitis: Secondary | ICD-10-CM

## 2018-04-19 DIAGNOSIS — E782 Mixed hyperlipidemia: Secondary | ICD-10-CM

## 2018-04-19 MED FILL — ATORVASTATIN 20 MG TABLET: 20 | 30 days supply | Qty: 30 | Fill #0

## 2018-04-22 ENCOUNTER — Ambulatory Visit: Payer: Medicare HMO | Attending: Family Medicine | Admitting: Physician Assistant

## 2018-04-22 VITALS — BP 109/72 | HR 72 | Temp 98.7°F | Resp 14 | Ht 66.0 in | Wt 179.2 lb

## 2018-04-22 DIAGNOSIS — E039 Hypothyroidism, unspecified: Secondary | ICD-10-CM

## 2018-04-22 DIAGNOSIS — E782 Mixed hyperlipidemia: Secondary | ICD-10-CM | POA: Diagnosis not present

## 2018-04-22 DIAGNOSIS — K219 Gastro-esophageal reflux disease without esophagitis: Secondary | ICD-10-CM

## 2018-04-22 MED ORDER — ATORVASTATIN CALCIUM 20 MG PO TABS: 20.0000 mg | ORAL_TABLET | Freq: Every day | ORAL | 0 refills | Status: DC

## 2018-04-22 MED ORDER — FAMOTIDINE 20 MG PO TABS: 20.0000 mg | ORAL_TABLET | Freq: Two times a day (BID) | ORAL | 3 refills | Status: DC

## 2018-04-22 MED ORDER — LEVOTHYROXINE SODIUM 125 MCG PO TABS: 125.0000 ug | ORAL_TABLET | Freq: Every day | ORAL | 3 refills | Status: DC

## 2018-04-22 MED ORDER — OMEPRAZOLE 20 MG PO CPDR: 20.0000 mg | DELAYED_RELEASE_CAPSULE | Freq: Every day | ORAL | 0 refills | Status: DC

## 2018-04-22 MED FILL — LEVOTHYROXINE 125 MCG TAB: 125 | 30 days supply | Qty: 30 | Fill #0

## 2018-04-22 NOTE — Progress Notes (Signed)
Patient ID: Rachel Everett, female   DOB: 1960/09/21, 58 y.o.   MRN: [id number]   Dyonna Sleeth, is a 58 y.o. female  [id number]  [id number]  DOB - 03/23/1960  Subjective:  Chief Complaint and HPI: Rachel Everett is a 58 y.o. female here today needing medication RF.  Ran out of all meds/took last doses yesterday.  No problems/concerns complaints today.    ROS:   Constitutional:  No f/c, No night sweats, No unexplained weight loss. EENT:  No vision changes, No blurry vision, No hearing changes. No mouth, throat, or ear problems.  Respiratory: No cough, No SOB Cardiac: No CP, no palpitations GI:  No abd pain, No N/V/D. GU: No Urinary s/sx Musculoskeletal: No joint pain Neuro: No headache, no dizziness, no motor weakness.  Skin: No rash Endocrine:  No polydipsia. No polyuria.  Psych: Denies SI/HI  No problems updated.  ALLERGIES: No Known Allergies  PAST MEDICAL HISTORY: Past Medical History:  Diagnosis Date  . Anxiety   . Bipolar 1 disorder (HCC)   . Depression   . GERD (gastroesophageal reflux disease)   . Hyperlipidemia   . SVD (spontaneous vaginal delivery)    x 1  . Thyroid disease     MEDICATIONS AT HOME: Prior to Admission medications   Medication Sig Start Date End Date Taking? Authorizing Provider  atorvastatin (LIPITOR) 20 MG tablet Take 1 tablet (20 mg total) by mouth daily. MUST MAKE APPT FOR FURTHER REFILLS 04/22/18  Yes Anders Simmonds, PA-C  lamoTRIgine (LAMICTAL) 150 MG tablet  02/15/18  Yes [provider]  lamoTRIgine (LAMICTAL) 200 MG tablet Take 300 mg by mouth daily.  08/20/14  Yes [provider]  lithium carbonate (LITHOBID) 300 MG CR tablet Take 300 mg by mouth 3 (three) times daily.  06/27/14  Yes [provider]  omeprazole (PRILOSEC) 20 MG capsule Take 1 capsule (20 mg total) by mouth daily. 04/22/18  Yes Georgian Co M, PA-C  propranolol (INDERAL) 10 MG tablet  04/19/18  Yes [provider]  QUEtiapine  (SEROQUEL) 200 MG tablet Take 200 mg by mouth as needed.  08/23/14  Yes [provider]  famotidine (PEPCID) 20 MG tablet Take 1 tablet (20 mg total) by mouth 2 (two) times daily. 04/22/18 04/22/19  Anders Simmonds, PA-C  levothyroxine (SYNTHROID, LEVOTHROID) 125 MCG tablet Take 1 tablet (125 mcg total) by mouth daily. 04/22/18   Anders Simmonds, PA-C     Objective:  EXAM:   Vitals:   04/22/18 1335  BP: 109/72  Pulse: 72  Temp: 98.7 F (37.1 C)  TempSrc: Oral  Weight: 179 lb 3.2 oz (81.3 kg)  Height: 5\' 6"  (1.676 m)  pulse ox 98% R 14  General appearance : A&OX3. NAD. Non-toxic-appearing HEENT: Atraumatic and Normocephalic.  PERRLA. EOM intact.  Neck: supple, no JVD. No cervical lymphadenopathy. No thyromegaly Chest/Lungs:  Breathing-non-labored, Good air entry bilaterally, breath sounds normal without rales, rhonchi, or wheezing  CVS: S1 S2 regular, no murmurs, gallops, rubs  Extremities: Bilateral Lower Ext shows no edema, both legs are warm to touch with = pulse throughout Neurology:  CN II-XII grossly intact, Non focal.   Psych:  TP linear. J/I WNL. Normal speech. Appropriate eye contact and affect.  Skin:  No Rash  Data Review Lab Results  Component Value Date   HGBA1C 5.0 05/14/2016     Assessment & Plan   1. Acquired hypothyroidism Will assess dose with labs.  For now, plan to continue daily.   -  Thyroid Panel With TSH - Lipid panel  2. Mixed hyperlipidemia - atorvastatin (LIPITOR) 20 MG tablet; Take 1 tablet (20 mg total) by mouth daily. MUST MAKE APPT FOR FURTHER REFILLS  Dispense: 30 tablet; Refill: 0 - Comprehensive metabolic panel - Lipid panel  3. Gastroesophageal reflux disease, esophagitis presence not specified - omeprazole (PRILOSEC) 20 MG capsule; Take 1 capsule (20 mg total) by mouth daily.  Dispense: 30 capsule; Refill: 0   Patient have been counseled extensively about nutrition and exercise  Return in about 4 months (around  08/21/2018) for Zelda for hyperlipidemia and thyroid.  The patient was given clear instructions to go to ER or return to medical center if symptoms don't improve, worsen or new problems develop. The patient verbalized understanding. The patient was told to call to get lab results if they haven't heard anything in the next week.     Georgian Co, PA-C [facility] [place], [place] [phone]   04/22/2018, 1:53 PM

## 2018-04-23 ENCOUNTER — Other Ambulatory Visit: Payer: Self-pay | Admitting: Physician Assistant

## 2018-04-23 ENCOUNTER — Telehealth: Payer: Self-pay

## 2018-04-23 LAB — COMPREHENSIVE METABOLIC PANEL
ALT: 11 IU/L (ref 0–32)
AST: 12 IU/L (ref 0–40)
Albumin/Globulin Ratio: 2.2 (ref 1.2–2.2)
Albumin: 4.4 g/dL (ref 3.8–4.9)
Alkaline Phosphatase: 135 IU/L — ABNORMAL HIGH (ref 39–117)
BUN/Creatinine Ratio: 14 (ref 9–23)
BUN: 13 mg/dL (ref 6–24)
CHLORIDE: 106 mmol/L (ref 96–106)
CO2: 23 mmol/L (ref 20–29)
Calcium: 9.8 mg/dL (ref 8.7–10.2)
Creatinine, Ser: 0.9 mg/dL (ref 0.57–1.00)
GFR calc Af Amer: 82 mL/min/{1.73_m2} (ref >59)
GFR calc non Af Amer: 71 mL/min/{1.73_m2} (ref >59)
Globulin, Total: 2 g/dL (ref 1.5–4.5)
Glucose: 89 mg/dL (ref 65–99)
Potassium: 4.4 mmol/L (ref 3.5–5.2)
Sodium: 143 mmol/L (ref 134–144)
Total Protein: 6.4 g/dL (ref 6.0–8.5)

## 2018-04-23 LAB — LIPID PANEL
Chol/HDL Ratio: 2.3 ratio (ref 0.0–4.4)
Cholesterol, Total: 145 mg/dL (ref 100–199)
HDL: 64 mg/dL (ref >39)
LDL Calculated: 62 mg/dL (ref 0–99)
Triglycerides: 93 mg/dL (ref 0–149)
VLDL CHOLESTEROL CAL: 19 mg/dL (ref 5–40)

## 2018-04-23 LAB — THYROID PANEL WITH TSH
FREE THYROXINE INDEX: 1.9 (ref 1.2–4.9)
T3 Uptake Ratio: 24 % (ref 24–39)
T4 TOTAL: 7.8 ug/dL (ref 4.5–12.0)
TSH: 0.358 u[IU]/mL — ABNORMAL LOW (ref 0.450–4.500)

## 2018-04-23 MED ORDER — LEVOTHYROXINE SODIUM 100 MCG PO TABS: 100.0000 ug | ORAL_TABLET | Freq: Every day | ORAL | 3 refills | Status: DC

## 2018-04-23 NOTE — Telephone Encounter (Signed)
-----   Message from Anders Simmonds, [place] sent at 04/23/2018  9:00 AM EST ----- Your thyroid medication dose is still a little too high.  I sent you a new prescription for tablets to take daily.  Stop the dose for now.  We will need to recheck the level in about 8 weeks.  Your other labs including blood sugar, kidney function, electrolytes, liver function, and cholesterol all look great.  Thanks, Georgian Co, PA-C

## 2018-04-23 NOTE — Telephone Encounter (Signed)
Patient has mychart

## 2018-04-26 ENCOUNTER — Encounter: Payer: Self-pay | Admitting: Nurse Practitioner

## 2018-04-27 ENCOUNTER — Other Ambulatory Visit: Payer: Self-pay | Admitting: Nurse Practitioner

## 2018-05-20 MED FILL — OMEPRAZOLE 20 MG CAPSULE DR: 20 | 30 days supply | Qty: 30 | Fill #0

## 2018-05-20 MED FILL — ATORVASTATIN 20 MG TABLET: 20 | 30 days supply | Qty: 30 | Fill #0

## 2018-06-21 ENCOUNTER — Other Ambulatory Visit: Payer: Self-pay | Admitting: Physician Assistant

## 2018-06-21 DIAGNOSIS — E782 Mixed hyperlipidemia: Secondary | ICD-10-CM

## 2018-06-21 MED FILL — OMEPRAZOLE 20 MG CAP: 20 | 30 days supply | Qty: 30 | Fill #0

## 2018-06-21 MED FILL — ATORVASTATIN 20 MG TABLET: 20 | 30 days supply | Qty: 30 | Fill #0

## 2018-07-11 ENCOUNTER — Encounter (HOSPITAL_COMMUNITY): Payer: Self-pay | Admitting: Emergency Medicine

## 2018-07-11 ENCOUNTER — Emergency Department (HOSPITAL_COMMUNITY): Payer: Medicare HMO

## 2018-07-11 ENCOUNTER — Other Ambulatory Visit: Payer: Self-pay

## 2018-07-11 ENCOUNTER — Emergency Department (HOSPITAL_COMMUNITY)
Admission: EM | Admit: 2018-07-11 | Discharge: 2018-07-11 | Disposition: A | Discharge status: Home / Self Care | Payer: Medicare HMO | Attending: Emergency Medicine | Admitting: Emergency Medicine

## 2018-07-11 DIAGNOSIS — S8992XA Unspecified injury of left lower leg, initial encounter: Secondary | ICD-10-CM | POA: Diagnosis not present

## 2018-07-11 DIAGNOSIS — M7989 Other specified soft tissue disorders: Secondary | ICD-10-CM | POA: Diagnosis not present

## 2018-07-11 DIAGNOSIS — Z79899 Other long term (current) drug therapy: Secondary | ICD-10-CM | POA: Diagnosis not present

## 2018-07-11 DIAGNOSIS — W109XXA Fall (on) (from) unspecified stairs and steps, initial encounter: Secondary | ICD-10-CM | POA: Diagnosis not present

## 2018-07-11 DIAGNOSIS — S8002XA Contusion of left knee, initial encounter: Secondary | ICD-10-CM | POA: Diagnosis not present

## 2018-07-11 DIAGNOSIS — M25562 Pain in left knee: Secondary | ICD-10-CM | POA: Diagnosis not present

## 2018-07-11 DIAGNOSIS — E039 Hypothyroidism, unspecified: Secondary | ICD-10-CM | POA: Diagnosis not present

## 2018-07-11 DIAGNOSIS — Y92009 Unspecified place in unspecified non-institutional (private) residence as the place of occurrence of the external cause: Secondary | ICD-10-CM | POA: Insufficient documentation

## 2018-07-11 DIAGNOSIS — Y999 Unspecified external cause status: Secondary | ICD-10-CM | POA: Insufficient documentation

## 2018-07-11 DIAGNOSIS — R2242 Localized swelling, mass and lump, left lower limb: Secondary | ICD-10-CM | POA: Diagnosis not present

## 2018-07-11 DIAGNOSIS — S80912A Unspecified superficial injury of left knee, initial encounter: Secondary | ICD-10-CM | POA: Diagnosis present

## 2018-07-11 DIAGNOSIS — Z87891 Personal history of nicotine dependence: Secondary | ICD-10-CM | POA: Diagnosis not present

## 2018-07-11 DIAGNOSIS — Y939 Activity, unspecified: Secondary | ICD-10-CM | POA: Insufficient documentation

## 2018-07-11 DIAGNOSIS — M79662 Pain in left lower leg: Secondary | ICD-10-CM | POA: Diagnosis not present

## 2018-07-11 LAB — CBC WITH DIFFERENTIAL/PLATELET
Abs Immature Granulocytes: 0.06 10*3/uL (ref 0.00–0.07)
Basophils Absolute: 0.1 10*3/uL (ref 0.0–0.1)
Basophils Relative: 1 %
Eosinophils Absolute: 0.4 10*3/uL (ref 0.0–0.5)
Eosinophils Relative: 4 %
HCT: 40.4 % (ref 36.0–46.0)
Hemoglobin: 12.9 g/dL (ref 12.0–15.0)
Immature Granulocytes: 1 %
Lymphocytes Relative: 30 %
Lymphs Abs: 3.2 10*3/uL (ref 0.7–4.0)
MCH: 30.7 pg (ref 26.0–34.0)
MCHC: 31.9 g/dL (ref 30.0–36.0)
MCV: 96.2 fL (ref 80.0–100.0)
Monocytes Absolute: 0.8 10*3/uL (ref 0.1–1.0)
Monocytes Relative: 7 %
Neutro Abs: 6.3 10*3/uL (ref 1.7–7.7)
Neutrophils Relative %: 57 %
Platelets: 309 10*3/uL (ref 150–400)
RBC: 4.2 MIL/uL (ref 3.87–5.11)
RDW: 13 % (ref 11.5–15.5)
WBC: 10.9 10*3/uL — ABNORMAL HIGH (ref 4.0–10.5)
nRBC: 0 % (ref 0.0–0.2)

## 2018-07-11 LAB — BASIC METABOLIC PANEL
Anion gap: 9 (ref 5–15)
BUN: 12 mg/dL (ref 6–20)
CO2: 23 mmol/L (ref 22–32)
Calcium: 9.8 mg/dL (ref 8.9–10.3)
Chloride: 104 mmol/L (ref 98–111)
Creatinine, Ser: 0.95 mg/dL (ref 0.44–1.00)
GFR calc Af Amer: >60 mL/min (ref >60)
GFR calc non Af Amer: >60 mL/min (ref >60)
Glucose, Bld: 107 mg/dL — ABNORMAL HIGH (ref 70–99)
Potassium: 4.3 mmol/L (ref 3.5–5.1)
Sodium: 136 mmol/L (ref 135–145)

## 2018-07-11 LAB — SYNOVIAL CELL COUNT + DIFF, W/ CRYSTALS
Crystals, Fluid: NONE SEEN
Eosinophils-Synovial: 0 % (ref 0–1)
Lymphocytes-Synovial Fld: 96 % — ABNORMAL HIGH (ref 0–20)
Monocyte-Macrophage-Synovial Fluid: 4 % — ABNORMAL LOW (ref 50–90)
Neutrophil, Synovial: 0 % (ref 0–25)
WBC, Synovial: 38 /mm3 (ref 0–200)

## 2018-07-11 MED ORDER — OXYCODONE-ACETAMINOPHEN 5-325 MG PO TABS: 1.0000 | ORAL_TABLET | ORAL | 0 refills | Status: DC | PRN

## 2018-07-11 MED ORDER — OXYCODONE-ACETAMINOPHEN 5-325 MG PO TABS
1.0000 | ORAL_TABLET | Freq: Once | ORAL | Status: AC
  Administered 2018-07-11: 1 via ORAL
  Filled 2018-07-11: qty 1

## 2018-07-11 MED ORDER — LIDOCAINE HCL (PF) 1 % IJ SOLN
5.0000 mL | Freq: Once | INTRAMUSCULAR | Status: AC
  Administered 2018-07-11: 5 mL
  Filled 2018-07-11: qty 5

## 2018-07-11 MED ORDER — HYDROMORPHONE HCL 1 MG/ML IJ SOLN
0.5000 mg | Freq: Once | INTRAMUSCULAR | Status: AC
  Administered 2018-07-11: 0.5 mg via INTRAVENOUS
  Filled 2018-07-11: qty 1

## 2018-07-11 MED ORDER — ONDANSETRON HCL 4 MG/2ML IJ SOLN
INTRAMUSCULAR | Status: AC
  Filled 2018-07-11: qty 2

## 2018-07-11 NOTE — ED Notes (Signed)
Patient verbalizes understanding of discharge instructions. Opportunity for questioning and answers were provided. Armband removed by staff, pt discharged from ED home via POV.  

## 2018-07-11 NOTE — ED Triage Notes (Signed)
Reports falling on Wednesday landing on the left knee.  Reports increased bruising and swelling.  Bruising noted from the knee to the ankle.

## 2018-07-11 NOTE — Discharge Instructions (Signed)
The x-rays are negative for fracture.  There is no evidence of infection. Follow-up with your orthopedic doctor. Return to the ED if develop fever, chills, vomiting, or other concerns.

## 2018-07-11 NOTE — ED Provider Notes (Signed)
[facility] EMERGENCY DEPARTMENT Provider Note   CSN: [id number] Arrival date & time: 07/11/18  0436    History   Chief Complaint Chief Complaint  Patient presents with  . Knee Pain    HPI Rachel Everett is a 58 y.o. female.     Patient reports tripping while coming up the stairs about 5 days ago and landing directly on her left knee.  She did not hit her head or lose consciousness.  She complains of increased bruising and swelling to her left knee and lower leg.  States she has been applying ice at home and keeping it elevated.  She states she has minimal pain when she tries to move or walk on it.  Most of her pain is on the anterior and medial left knee.  She had ACL surgery on this knee about 25 years ago.  Denies any fevers, chills, nausea, vomiting.  She does not take any blood thinners.  She denies any focal weakness, numbness or tingling.  Denies any head, neck, back injury.  She came in tonight because she is worried about increasing swelling and pain.  No history of DVT PE  The history is provided by the patient.  Knee Pain    Past Medical History:  Diagnosis Date  . Anxiety   . Bipolar 1 disorder (HCC)   . Depression   . GERD (gastroesophageal reflux disease)   . Hyperlipidemia   . SVD (spontaneous vaginal delivery)    x 1  . Thyroid disease     Patient Active Problem List   Diagnosis Date Noted  . Bipolar disorder, current episode mixed, moderate (HCC) 08/28/2017  . Mixed hyperlipidemia 11/24/2016  . Diabetes mellitus screening 05/14/2016  . Vitamin D deficiency 05/14/2016  . Hypothyroidism 09/11/2015  . Thyroid disease 09/11/2014    Past Surgical History:  Procedure Laterality Date  . ANKLE SURGERY     right   . ANTERIOR CRUCIATE LIGAMENT REPAIR Left   . BACK SURGERY     L5-S1  . CHOLECYSTECTOMY    . FRACTURE SURGERY Left    elbow  . TENDON REPAIR Right    angle  . TONSILLECTOMY       OB History   No obstetric history on file.       Home Medications    Prior to Admission medications   Medication Sig Start Date End Date Taking? Authorizing Provider  atorvastatin (LIPITOR) 20 MG tablet TAKE 1 TABLET (20 MG TOTAL) BY MOUTH DAILY. MUST MAKE APPT FOR FURTHER REFILLS 06/21/18   Claiborne Rigg, NP  lamoTRIgine (LAMICTAL) 200 MG tablet Take 200 mg by mouth 2 (two) times daily.    [provider]  levothyroxine (SYNTHROID, LEVOTHROID) 100 MCG tablet Take 1 tablet (100 mcg total) by mouth daily. 04/23/18   Anders Simmonds, PA-C  lithium carbonate (LITHOBID) 300 MG CR tablet Take 300 mg by mouth 3 (three) times daily.  06/27/14   [provider]  omeprazole (PRILOSEC) 20 MG capsule Take 1 capsule (20 mg total) by mouth daily. 04/22/18   Anders Simmonds, PA-C  propranolol (INDERAL) 10 MG tablet  04/19/18   [provider]  QUEtiapine (SEROQUEL XR) 50 MG TB24 24 hr tablet Take 50 mg by mouth daily as needed.    [provider]    Family History Family History  Problem Relation Age of Onset  . Cancer Mother   . Cancer Maternal Grandmother   . Colon cancer Neg Hx   .  Esophageal cancer Neg Hx   . Rectal cancer Neg Hx   . Stomach cancer Neg Hx     Social History Social History   Tobacco Use  . Smoking status: Former Smoker    Packs/day: 0.50    Years: 1.00    Pack years: 0.50    Types: Cigarettes    Last attempt to quit: 10/04/2015    Years since quitting: 2.7  . Smokeless tobacco: Never Used  Substance Use Topics  . Alcohol use: Yes    Alcohol/week: 0.0 standard drinks    Comment: rare  . Drug use: No     Allergies   Patient has no known allergies.   Review of Systems Review of Systems  Constitutional: Negative for activity change and appetite change.  HENT: Negative for congestion and rhinorrhea.   Eyes: Negative for visual disturbance.  Respiratory: Negative for cough, chest tightness and shortness of breath.   Cardiovascular: Negative for chest pain.   Gastrointestinal: Negative for abdominal pain, nausea and vomiting.  Genitourinary: Negative for dysuria and hematuria.  Musculoskeletal: Positive for arthralgias and myalgias.  Skin: Negative for rash.  Neurological: Negative for dizziness, weakness and headaches.    all other systems are negative except as noted in the HPI and PMH.    Physical Exam Updated Vital Signs BP 124/63 (BP Location: Right Arm)   Pulse 69   Temp 98.2 F (36.8 C) (Oral)   Ht  (1.676 m)   Wt 72.6 kg   SpO2 97%   BMI 25.82 kg/m   Physical Exam Vitals signs and nursing note reviewed.  Constitutional:      General: She is not in acute distress.    Appearance: She is well-developed.  HENT:     Head: Normocephalic and atraumatic.     Mouth/Throat:     Pharynx: No oropharyngeal exudate.  Eyes:     Conjunctiva/sclera: Conjunctivae normal.     Pupils: Pupils are equal, round, and reactive to light.  Neck:     Musculoskeletal: Normal range of motion and neck supple.     Comments: No meningismus. Cardiovascular:     Rate and Rhythm: Normal rate and regular rhythm.     Heart sounds: Normal heart sounds. No murmur.  Pulmonary:     Effort: Pulmonary effort is normal. No respiratory distress.     Breath sounds: Normal breath sounds.  Chest:     Chest wall: No tenderness.  Abdominal:     Palpations: Abdomen is soft.     Tenderness: There is no abdominal tenderness. There is no guarding or rebound.  Musculoskeletal: Normal range of motion.        General: Tenderness and signs of injury present.     Left lower leg: Edema present.     Comments: Diffuse ecchymosis to L knee and lower leg. TTP mostly anterior and medially to L knee. Reduced ROM 2/2 pain.  Diffuse warmth to L knee without significant erythema.  FROM ankle. Intact DP and PT pulses. Compartments soft. Able to lift leg and keep leg extended. Achilles intact.  Skin:    General: Skin is warm.     Capillary Refill: Capillary refill takes  less than 2 seconds.  Neurological:     General: No focal deficit present.     Mental Status: She is alert and oriented to person, place, and time. Mental status is at baseline.     Cranial Nerves: No cranial nerve deficit.     Motor: No abnormal  muscle tone.     Coordination: Coordination normal.     Comments:  5/5 strength throughout. CN 2-12 intact.Equal grip strength.   Psychiatric:        Behavior: Behavior normal.          ED Treatments / Results  Labs (all labs ordered are listed, but only abnormal results are displayed) Labs Reviewed  CBC WITH DIFFERENTIAL/PLATELET - Abnormal; Notable for the following components:      Result Value   WBC 10.9 (*)    All other components within normal limits  BASIC METABOLIC PANEL - Abnormal; Notable for the following components:   Glucose, Bld 107 (*)    All other components within normal limits  BODY FLUID CULTURE  GLUCOSE, BODY FLUID OTHER  PROTEIN, BODY FLUID (OTHER)  SYNOVIAL CELL COUNT + DIFF, W/ CRYSTALS    EKG None  Radiology Dg Tibia/fibula Left  Result Date: 07/11/2018 CLINICAL DATA:  Pain after recent fall. EXAM: LEFT TIBIA AND FIBULA - 2 VIEW COMPARISON:  None. FINDINGS: There is evidence of a previous ACL repair. No acute fractures. Soft tissue swelling anterior to the proximal tibia. A calcification in the soft tissues distal to the fibula is likely from a remote avulsion injury. IMPRESSION: Soft tissue swelling adjacent to the proximal tibia. No fractures noted. Electronically Signed   By: Gerome Samavid  Williams III M.D   On: 07/11/2018 05:51   Dg Knee Complete 4 Views Left  Result Date: 07/11/2018 CLINICAL DATA:  Fall several days ago with bruising and swelling. Pain. EXAM: LEFT KNEE - COMPLETE 4+ VIEW COMPARISON:  None. FINDINGS: There is evidence of previous ACL repair. No fracture or joint effusion noted. Anterior soft tissue swelling identified. IMPRESSION: Anterior soft tissue swelling.  No acute bony  abnormalities. Electronically Signed   By: Gerome Samavid  Williams III M.D   On: 07/11/2018 05:49    Procedures .Joint Aspiration/Arthrocentesis Date/Time: 07/11/2018 6:42 AM Performed by: Glynn Octaveancour, Myrna Vonseggern, MD Authorized by: Glynn Octaveancour, Rianne Degraaf, MD   Consent:    Consent obtained:  Verbal   Consent given by:  Patient   Risks discussed:  Bleeding, incomplete drainage, infection and pain   Alternatives discussed:  No treatment Location:    Location:  Knee   Knee:  L knee Anesthesia (see MAR for exact dosages):    Anesthesia method:  Local infiltration   Local anesthetic:  Lidocaine 1% w/o epi Procedure details:    Preparation: Patient was prepped and draped in usual sterile fashion     Needle gauge:  18 G   Ultrasound guidance: no     Approach:  Medial   Aspirate amount:  2   Aspirate characteristics:  Clear   Steroid injected: no     Specimen collected: yes   Post-procedure details:    Dressing:  Adhesive bandage   Patient tolerance of procedure:  Tolerated well, no immediate complications   (including critical care time)  Medications Ordered in ED Medications  oxyCODONE-acetaminophen (PERCOCET/ROXICET) 5-325 MG per tablet 1 tablet (has no administration in time range)     Initial Impression / Assessment and Plan / ED Course  I have reviewed the triage vital signs and the nursing notes.  Pertinent labs & imaging results that were available during my care of the patient were reviewed by me and considered in my medical decision making (see chart for details).       Left leg pain swelling after fall 5 days ago. Neurovascularly intact with soft compartments.  No fever.  Xrays  negative for fracture or dislocation.  There is no significant joint fluid seen. The knee is warm but there is no erythema.  She is able to flex and extend with minimal difficulty.  Given ongoing pain with range of motion and warmth of knee, joint aspiration performed.  Minimal joint fluid obtained but  appears to be clear.  CT scan will be obtained for further evaluation of her leg pain and ecchymosis.  Consider Morel-Lavallee lesion.  Care transferred at time of signout to Dr. Patria Mane.  Patient given crutches, knee immobilizer, orthopedic follow-up. Return precautions discussed.  Final Clinical Impressions(s) / ED Diagnoses   Final diagnoses:  Contusion of left knee, initial encounter  Acute pain of left knee    ED Discharge Orders    None       Sanam Marmo, Jeannett Senior, MD 07/11/18 [phone]

## 2018-07-11 NOTE — ED Notes (Signed)
Pt refused crutches and knee immobilizer.

## 2018-07-11 NOTE — ED Provider Notes (Signed)
Synovial fluid without significant abnormality. CT imaging without acute findings. Ambulatory. Well appearing. Compartments soft. Dc home in good condition. Follow up with her orthopedic team at [facility]   Dg Tibia/fibula Left  Result Date: 07/11/2018 CLINICAL DATA:  Pain after recent fall. EXAM: LEFT TIBIA AND FIBULA - 2 VIEW COMPARISON:  None. FINDINGS: There is evidence of a previous ACL repair. No acute fractures. Soft tissue swelling anterior to the proximal tibia. A calcification in the soft tissues distal to the fibula is likely from a remote avulsion injury. IMPRESSION: Soft tissue swelling adjacent to the proximal tibia. No fractures noted. Electronically Signed   By: Gerome Sam III M.D   On: 07/11/2018 05:51   Dg Knee Complete 4 Views Left  Result Date: 07/11/2018 CLINICAL DATA:  Fall several days ago with bruising and swelling. Pain. EXAM: LEFT KNEE - COMPLETE 4+ VIEW COMPARISON:  None. FINDINGS: There is evidence of previous ACL repair. No fracture or joint effusion noted. Anterior soft tissue swelling identified. IMPRESSION: Anterior soft tissue swelling.  No acute bony abnormalities. Electronically Signed   By: Gerome Sam III M.D   On: 07/11/2018 05:49      Azalia Bilis, MD 07/11/18 1037

## 2018-07-11 NOTE — Progress Notes (Signed)
Orthopedic Tech Progress Note Patient Details:  Rachel Everett Nov 27, 1960 [id number] Came to service patient. While in there DR came in (the day shift DR) and patient told him that she didn't need the knee immobilizer or crutches. That she had a knee brace to wear at home as well as a pair of crutches.  Patient ID: Rachel Everett, female   DOB: 1960/12/24, 58 y.o.   MRN: [id number]   Donald Pore 07/11/2018, 7:38 AM

## 2018-07-14 LAB — BODY FLUID CULTURE
Culture: NO GROWTH
Special Requests: NORMAL

## 2018-07-19 DIAGNOSIS — F3132 Bipolar disorder, current episode depressed, moderate: Secondary | ICD-10-CM | POA: Diagnosis not present

## 2018-07-21 MED FILL — ATORVASTATIN 20 MG TABLET: 20 | 30 days supply | Qty: 30 | Fill #1

## 2018-08-20 MED FILL — ATORVASTATIN 20 MG TABLET: 20 | 30 days supply | Qty: 30 | Fill #2

## 2018-08-23 ENCOUNTER — Encounter: Payer: Medicare HMO | Admitting: Nurse Practitioner

## 2018-09-16 MED FILL — ATORVASTATIN 20 MG TABLET: 20 | 30 days supply | Qty: 30 | Fill #2

## 2018-10-18 ENCOUNTER — Telehealth: Payer: Self-pay | Admitting: Nurse Practitioner

## 2018-10-18 DIAGNOSIS — K219 Gastro-esophageal reflux disease without esophagitis: Secondary | ICD-10-CM

## 2018-10-18 DIAGNOSIS — E782 Mixed hyperlipidemia: Secondary | ICD-10-CM

## 2018-10-18 NOTE — Telephone Encounter (Signed)
1) Medication(s) Requested (by name): -atorvastatin (LIPITOR) 20 MG tablet -levothyroxine (SYNTHROID, LEVOTHROID) 100 MCG tablet  -omeprazole (PRILOSEC) 20 MG capsule   2) Pharmacy of Choice: [facility] pharmacy 3) Special Requests: Enough Until her follow up appt 11/12/2018  Approved medications will be sent to the pharmacy, we will reach out if there is an issue.  Requests made after 3pm may not be addressed until the following business day!  If a patient is unsure of the name of the medication(s) please note and ask patient to call back when they are able to provide all info, do not send to responsible party until all information is available!

## 2018-10-19 ENCOUNTER — Telehealth: Payer: Self-pay | Admitting: Nurse Practitioner

## 2018-10-19 DIAGNOSIS — F3132 Bipolar disorder, current episode depressed, moderate: Secondary | ICD-10-CM | POA: Diagnosis not present

## 2018-10-19 NOTE — Telephone Encounter (Signed)
Pt request a pain medication for two lumbar degenerative disks compression pain. Pt states xrays from last year in chart showing she has this problem. Pt next appt 11/12/18.  Pt uses [facility] pharmacy. Pt wants mailed to her due to transportation issues. [place]. Pt ph [phone].

## 2018-10-20 ENCOUNTER — Other Ambulatory Visit: Payer: Self-pay | Admitting: Nurse Practitioner

## 2018-10-20 MED ORDER — ATORVASTATIN CALCIUM 20 MG PO TABS: 20.0000 mg | ORAL_TABLET | Freq: Every day | ORAL | 0 refills | Status: DC

## 2018-10-20 MED ORDER — OMEPRAZOLE 20 MG PO CPDR: 20.0000 mg | DELAYED_RELEASE_CAPSULE | Freq: Every day | ORAL | 0 refills | Status: DC

## 2018-10-20 MED ORDER — NAPROXEN 500 MG PO TABS: 500.0000 mg | ORAL_TABLET | Freq: Two times a day (BID) | ORAL | 0 refills | Status: AC | PRN

## 2018-10-20 MED FILL — ATORVASTATIN 20 MG TABLET: 20 | 30 days supply | Qty: 30 | Fill #0

## 2018-10-20 MED FILL — NAPROXEN 500 MG TABLET: 500 | 30 days supply | Qty: 60 | Fill #0

## 2018-10-20 MED FILL — LEVOTHYROXINE 125 MCG TAB: 125 | 30 days supply | Qty: 30 | Fill #1

## 2018-10-20 MED FILL — OMEPRAZOLE 20 MG CAP: 20 | 30 days supply | Qty: 30 | Fill #0

## 2018-10-20 NOTE — Telephone Encounter (Signed)
I will refer patient back to Orthopedics. She did not follow back up with him as instructed.   I will not send controlled substance such as tramadol or tylenol # 3 as she needs to be evaluated by orthopedics and they did not have her taking any controlled substances for back pain. However Naproxen will be mailed to her home.   Plan: Patient will proceed with physical therapy I will recheck her in 4 weeks if she is having persistent symptoms we will proceed with lumbar MRI scan with and without contrast.  She has chronic low back pain without nerve root tension signs and no weakness.  Follow-Up Instructions: Return in about 4 weeks (around 05/12/2017).

## 2018-10-20 NOTE — Telephone Encounter (Signed)
Called pt advised of provider response in prior note. Pt states she no longer has insurance and cannot afford to go to the orthopedics. Pt states will keep appt with Raul Del 11/12/18 as scheduled.

## 2018-10-22 MED FILL — hydrOXYzine HCL 25 MG TABS: 25 | 30 days supply | Qty: 30 | Fill #0

## 2018-10-22 MED FILL — lamoTRIgine 200 MG TABS: 200 | 30 days supply | Qty: 60 | Fill #0

## 2018-10-22 MED FILL — QUETIAPINE FUMARATE 50 MG T: 50 | 30 days supply | Qty: 30 | Fill #0

## 2018-10-22 MED FILL — PROPRANOLOL 10 MG TABLET: 10 | 30 days supply | Qty: 60 | Fill #0

## 2018-11-10 MED FILL — LITHIUM CARBONATE 300 MG CA: 300 | 30 days supply | Qty: 90 | Fill #0

## 2018-11-12 ENCOUNTER — Ambulatory Visit: Payer: Medicare HMO | Admitting: Nurse Practitioner

## 2018-11-19 MED FILL — LITHIUM CARBONATE 300 MG CA: 300 | 30 days supply | Qty: 90 | Fill #0

## 2018-11-26 ENCOUNTER — Other Ambulatory Visit: Payer: Self-pay | Admitting: Nurse Practitioner

## 2018-11-26 DIAGNOSIS — E782 Mixed hyperlipidemia: Secondary | ICD-10-CM

## 2018-11-26 DIAGNOSIS — K219 Gastro-esophageal reflux disease without esophagitis: Secondary | ICD-10-CM

## 2018-11-26 MED FILL — hydrOXYzine HCL 25 MG TABS: 25 | 30 days supply | Qty: 30 | Fill #1

## 2018-11-26 MED FILL — ATORVASTATIN 20 MG TABLET: 20 | 30 days supply | Qty: 30 | Fill #0

## 2018-11-26 MED FILL — lamoTRIgine 200 MG TABS: 200 | 30 days supply | Qty: 60 | Fill #1

## 2018-11-26 MED FILL — QUETIAPINE FUMARATE 50 MG T: 50 | 30 days supply | Qty: 30 | Fill #1

## 2018-11-26 MED FILL — OMEPRAZOLE 20 MG CAP: 20 | 30 days supply | Qty: 30 | Fill #0

## 2018-11-26 MED FILL — PROPRANOLOL 10 MG TABLET: 10 | 30 days supply | Qty: 60 | Fill #1

## 2018-11-26 MED FILL — LEVOTHYROXINE 125 MCG TAB: 125 | 30 days supply | Qty: 30 | Fill #2

## 2018-12-10 ENCOUNTER — Ambulatory Visit: Payer: Medicare HMO | Admitting: Nurse Practitioner

## 2018-12-22 MED FILL — LITHIUM CARBONATE 300 MG CA: 300 | 30 days supply | Qty: 90 | Fill #1

## 2018-12-29 ENCOUNTER — Other Ambulatory Visit: Payer: Self-pay | Admitting: Family Medicine

## 2018-12-29 ENCOUNTER — Telehealth: Payer: Self-pay | Admitting: Pharmacist

## 2018-12-29 DIAGNOSIS — K219 Gastro-esophageal reflux disease without esophagitis: Secondary | ICD-10-CM

## 2018-12-29 DIAGNOSIS — E782 Mixed hyperlipidemia: Secondary | ICD-10-CM

## 2018-12-29 MED FILL — PROPRANOLOL 10 MG TABLET: 10 | 30 days supply | Qty: 60 | Fill #2

## 2018-12-29 MED FILL — QUETIAPINE FUMARATE 50 MG T: 50 | 30 days supply | Qty: 30 | Fill #2

## 2018-12-29 MED FILL — lamoTRIgine 200 MG TABS: 200 | 30 days supply | Qty: 60 | Fill #2

## 2018-12-29 MED FILL — LEVOTHYROXINE 125 MCG TAB: 125 | 30 days supply | Qty: 30 | Fill #3

## 2018-12-29 NOTE — Telephone Encounter (Signed)
[facility] pharmacy requesting Levothyroxine manufacturer change. Patient has not been seen in a while. Recommend f/u of levels in 6 weeks. Will forward to PCP.

## 2018-12-30 NOTE — Telephone Encounter (Signed)
Pt. Have an OV with PCP on 01/03/2019.

## 2019-01-03 ENCOUNTER — Ambulatory Visit: Payer: Medicare HMO | Admitting: Nurse Practitioner

## 2019-01-05 ENCOUNTER — Other Ambulatory Visit: Payer: Self-pay | Admitting: Family Medicine

## 2019-01-05 DIAGNOSIS — E782 Mixed hyperlipidemia: Secondary | ICD-10-CM

## 2019-01-05 DIAGNOSIS — K219 Gastro-esophageal reflux disease without esophagitis: Secondary | ICD-10-CM

## 2019-01-12 ENCOUNTER — Other Ambulatory Visit: Payer: Self-pay | Admitting: Pharmacist

## 2019-01-12 ENCOUNTER — Other Ambulatory Visit: Payer: Self-pay | Admitting: Family Medicine

## 2019-01-12 DIAGNOSIS — F3132 Bipolar disorder, current episode depressed, moderate: Secondary | ICD-10-CM | POA: Diagnosis not present

## 2019-01-12 DIAGNOSIS — E782 Mixed hyperlipidemia: Secondary | ICD-10-CM

## 2019-01-12 MED ORDER — ATORVASTATIN CALCIUM 20 MG PO TABS: 20.0000 mg | ORAL_TABLET | Freq: Every day | ORAL | 0 refills | Status: DC

## 2019-01-12 MED FILL — ATORVASTATIN CALCIUM 20 MG: 20 | 15 days supply | Qty: 15 | Fill #0

## 2019-01-26 ENCOUNTER — Other Ambulatory Visit: Payer: Self-pay | Admitting: Family Medicine

## 2019-01-26 ENCOUNTER — Other Ambulatory Visit: Payer: Self-pay | Admitting: Physician Assistant

## 2019-01-26 DIAGNOSIS — K219 Gastro-esophageal reflux disease without esophagitis: Secondary | ICD-10-CM

## 2019-01-26 MED FILL — QUETIAPINE FUMARATE 50 MG T: 50 | 30 days supply | Qty: 60 | Fill #0

## 2019-01-26 MED FILL — PROPRANOLOL 10 MG TABLET: 10 | 30 days supply | Qty: 60 | Fill #0

## 2019-01-26 MED FILL — lamoTRIgine 200 MG TABS: 200 | 30 days supply | Qty: 60 | Fill #0

## 2019-01-28 ENCOUNTER — Ambulatory Visit: Payer: Medicare HMO | Attending: Nurse Practitioner | Admitting: Nurse Practitioner

## 2019-01-28 ENCOUNTER — Encounter: Payer: Self-pay | Admitting: Nurse Practitioner

## 2019-01-28 ENCOUNTER — Other Ambulatory Visit: Payer: Self-pay

## 2019-01-28 DIAGNOSIS — F3162 Bipolar disorder, current episode mixed, moderate: Secondary | ICD-10-CM

## 2019-01-28 DIAGNOSIS — K219 Gastro-esophageal reflux disease without esophagitis: Secondary | ICD-10-CM

## 2019-01-28 DIAGNOSIS — E039 Hypothyroidism, unspecified: Secondary | ICD-10-CM

## 2019-01-28 DIAGNOSIS — R748 Abnormal levels of other serum enzymes: Secondary | ICD-10-CM

## 2019-01-28 DIAGNOSIS — E782 Mixed hyperlipidemia: Secondary | ICD-10-CM | POA: Diagnosis not present

## 2019-01-28 MED ORDER — ATORVASTATIN CALCIUM 20 MG PO TABS: 20.0000 mg | ORAL_TABLET | Freq: Every day | ORAL | 0 refills | Status: DC

## 2019-01-28 MED ORDER — OMEPRAZOLE 20 MG PO CPDR: 20.0000 mg | DELAYED_RELEASE_CAPSULE | Freq: Every day | ORAL | 2 refills | Status: DC

## 2019-01-28 MED ORDER — LEVOTHYROXINE SODIUM 100 MCG PO TABS: 100.0000 ug | ORAL_TABLET | Freq: Every day | ORAL | 3 refills | Status: DC

## 2019-01-28 MED ORDER — OMEPRAZOLE 20 MG PO CPDR: 20.0000 mg | DELAYED_RELEASE_CAPSULE | Freq: Every day | ORAL | 0 refills | Status: DC

## 2019-01-28 MED FILL — LEVOTHYROXINE 100 MCG TAB: 100 | 90 days supply | Qty: 90 | Fill #0

## 2019-01-28 MED FILL — ATORVASTATIN CALCIUM 20 MG: 20 | 90 days supply | Qty: 90 | Fill #0

## 2019-01-28 MED FILL — OMEPRAZOLE 20 MG CAP: 20 | 90 days supply | Qty: 90 | Fill #0

## 2019-01-28 NOTE — Progress Notes (Signed)
Virtual Visit via Telephone Note Due to national recommendations of social distancing due to COVID 19, telehealth visit is felt to be most appropriate for this patient at this time.  I discussed the limitations, risks, security and privacy concerns of performing an evaluation and management service by telephone and the availability of in person appointments. I also discussed with the patient that there may be a patient responsible charge related to this service. The patient expressed understanding and agreed to proceed.    I connected with Rachel CrawfordSusan Everett on 01/30/19  at   2:10 PM EST  EDT by telephone and verified that I am speaking with the correct person using two identifiers.   Consent I discussed the limitations, risks, security and privacy concerns of performing an evaluation and management service by telephone and the availability of in person appointments. I also discussed with the patient that there may be a patient responsible charge related to this service. The patient expressed understanding and agreed to proceed.   Location of Patient: Private Residence   [facility]    Persons participating in Telemedicine visit: Rachel DenverZelda  FNP-BC YY ElmendorfBien Everett Rachel CrawfordSusan Correnti    History of Present Illness: Telemedicine visit for: Follow up  Hypothyroidism Taking synthroid 100 mcg daily as prescribed. She takes it by itself early in the mornings. Levels have been low. Will order repeat TSH today.  Current symptoms: none . Patient denies change in energy level, diarrhea, heat / cold intolerance, nervousness, palpitations and weight changes.  Lab Results  Component Value Date   TSH 0.358 (L) 04/22/2018   MOOD DISORDER She sees psychiatry about every 6 weeks. Doing well. Taking lamictal 200 mg daily, lithobid 300 mg TID, seroquel XR 50 mg daily and propranolol 10 mg daily.     Past Medical History:  Diagnosis Date  . Anxiety   . Bipolar  1 disorder (HCC)   . Depression   . GERD (gastroesophageal reflux disease)   . Hyperlipidemia   . SVD (spontaneous vaginal delivery)    x 1  . Thyroid disease     Past Surgical History:  Procedure Laterality Date  . ANKLE SURGERY     right   . ANTERIOR CRUCIATE LIGAMENT REPAIR Left   . BACK SURGERY     L5-S1  . CHOLECYSTECTOMY    . FRACTURE SURGERY Left    elbow  . TENDON REPAIR Right    angle  . TONSILLECTOMY      Family History  Problem Relation Age of Onset  . Cancer Mother   . Cancer Maternal Grandmother   . Colon cancer Neg Hx   . Esophageal cancer Neg Hx   . Rectal cancer Neg Hx   . Stomach cancer Neg Hx     Social History   Socioeconomic History  . Marital status: Single    Spouse name: Not on file  . Number of children: Not on file  . Years of education: Not on file  . Highest education level: Not on file  Occupational History  . Not on file  Social Needs  . Financial resource strain: Not on file  . Food insecurity    Worry: Not on file    Inability: Not on file  . Transportation needs    Medical: Not on file    Non-medical: Not on file  Tobacco Use  . Smoking status: Former Smoker    Packs/day: 0.50    Years: 1.00    Pack  years: 0.50    Types: Cigarettes    Quit date: 10/04/2015    Years since quitting: 3.3  . Smokeless tobacco: Never Used  Substance and Sexual Activity  . Alcohol use: Yes    Alcohol/week: 0.0 standard drinks    Comment: rare  . Drug use: No  . Sexual activity: Yes    Birth control/protection: Post-menopausal  Lifestyle  . Physical activity    Days per week: Not on file    Minutes per session: Not on file  . Stress: Not on file  Relationships  . Social Herbalist on phone: Not on file    Gets together: Not on file    Attends religious service: Not on file    Active member of club or organization: Not on file    Attends meetings of clubs or organizations: Not on file    Relationship status: Not on file   Other Topics Concern  . Not on file  Social History Narrative  . Not on file     Observations/Objective: Awake, alert and oriented x 3   Review of Systems  Constitutional: Negative for fever, malaise/fatigue and weight loss.  HENT: Negative.  Negative for nosebleeds.   Eyes: Negative.  Negative for blurred vision, double vision and photophobia.  Respiratory: Negative.  Negative for cough and shortness of breath.   Cardiovascular: Negative.  Negative for chest pain, palpitations and leg swelling.  Gastrointestinal: Positive for heartburn. Negative for nausea and vomiting.  Musculoskeletal: Negative.  Negative for myalgias.  Neurological: Negative.  Negative for dizziness, focal weakness, seizures and headaches.  Psychiatric/Behavioral: Positive for depression. Negative for suicidal ideas. The patient is nervous/anxious.     Assessment and Plan: [place] was seen today for medication refill.  Diagnoses and all orders for this visit:  Acquired hypothyroidism REPEAT TSH Continue synthroid as prescribed.   Bipolar disorder, current episode mixed, moderate ([place]) Continue follow up with psychiatry   Mixed hyperlipidemia -     Discontinue: atorvastatin (LIPITOR) 20 MG tablet; Take 1 tablet (20 mg total) by mouth daily. -     atorvastatin (LIPITOR) 20 MG tablet; Take 1 tablet (20 mg total) by mouth daily. Lab Results  Component Value Date   LDLCALC 62 04/22/2018  INSTRUCTIONS: Work on a low fat, heart healthy diet and participate in regular aerobic exercise program by working out at least 150 minutes per week; 5 days a week-30 minutes per day. Avoid red meat/beef/steak,  fried foods. junk foods, sodas, sugary drinks, unhealthy snacking, alcohol and smoking.  Drink at least 80 oz of water per day and monitor your carbohydrate intake daily.    Gastroesophageal reflux disease -     Discontinue: omeprazole (PRILOSEC) 20 MG capsule; Take 1 capsule (20 mg total) by mouth daily. -      omeprazole (PRILOSEC) 20 MG capsule; Take 1 capsule (20 mg total) by mouth daily. INSTRUCTIONS: Avoid GERD Triggers: acidic, spicy or fried foods, caffeine, coffee, sodas,  alcohol and chocolate.       Follow Up Instructions Return in about 3 months (around 04/30/2019).     I discussed the assessment and treatment plan with the patient. The patient was provided an opportunity to ask questions and all were answered. The patient agreed with the plan and demonstrated an understanding of the instructions.   The patient was advised to call back or seek an in-person evaluation if the symptoms worsen or if the condition fails to improve as anticipated.  I provided  18 minutes of non-face-to-face time during this encounter including median intraservice time, reviewing previous notes, labs, imaging, medications and explaining diagnosis and management.  Claiborne Rigg, FNP-BC

## 2019-01-30 ENCOUNTER — Encounter: Payer: Self-pay | Admitting: Nurse Practitioner

## 2019-01-31 ENCOUNTER — Other Ambulatory Visit: Payer: Self-pay

## 2019-01-31 ENCOUNTER — Ambulatory Visit: Payer: Medicare HMO | Attending: Nurse Practitioner

## 2019-01-31 DIAGNOSIS — E039 Hypothyroidism, unspecified: Secondary | ICD-10-CM | POA: Diagnosis not present

## 2019-01-31 DIAGNOSIS — R748 Abnormal levels of other serum enzymes: Secondary | ICD-10-CM | POA: Diagnosis not present

## 2019-02-01 ENCOUNTER — Other Ambulatory Visit: Payer: Self-pay | Admitting: Nurse Practitioner

## 2019-02-01 LAB — CMP14+EGFR
ALT: 29 IU/L (ref 0–32)
AST: 26 IU/L (ref 0–40)
Albumin/Globulin Ratio: 1.8 (ref 1.2–2.2)
Albumin: 4.6 g/dL (ref 3.8–4.9)
Alkaline Phosphatase: 133 IU/L — ABNORMAL HIGH (ref 39–117)
BUN/Creatinine Ratio: 19 (ref 9–23)
BUN: 20 mg/dL (ref 6–24)
Bilirubin Total: <0.2 mg/dL (ref 0.0–1.2)
CO2: 24 mmol/L (ref 20–29)
Calcium: 10.3 mg/dL — ABNORMAL HIGH (ref 8.7–10.2)
Chloride: 99 mmol/L (ref 96–106)
Creatinine, Ser: 1.07 mg/dL — ABNORMAL HIGH (ref 0.57–1.00)
GFR calc Af Amer: 66 mL/min/{1.73_m2} (ref >59)
GFR calc non Af Amer: 57 mL/min/{1.73_m2} — ABNORMAL LOW (ref >59)
Globulin, Total: 2.6 g/dL (ref 1.5–4.5)
Glucose: 92 mg/dL (ref 65–99)
Potassium: 5 mmol/L (ref 3.5–5.2)
Sodium: 139 mmol/L (ref 134–144)
Total Protein: 7.2 g/dL (ref 6.0–8.5)

## 2019-02-01 LAB — TSH: TSH: 33.9 u[IU]/mL — ABNORMAL HIGH (ref 0.450–4.500)

## 2019-02-01 MED ORDER — LEVOTHYROXINE SODIUM 125 MCG PO TABS: 125.0000 ug | ORAL_TABLET | Freq: Every day | ORAL | 0 refills | Status: DC

## 2019-02-02 MED FILL — LEVOTHYROXINE 125 MCG TAB: 125 | 30 days supply | Qty: 30 | Fill #0

## 2019-02-02 MED FILL — LITHIUM CARBONATE 300 MG CA: 300 | 30 days supply | Qty: 90 | Fill #0

## 2019-03-08 MED FILL — hydrOXYzine HCL 25 MG TABS: 25 | 90 days supply | Qty: 90 | Fill #0

## 2019-03-08 MED FILL — LEVOTHYROXINE 125 MCG TAB: 125 | 30 days supply | Qty: 30 | Fill #1

## 2019-03-08 MED FILL — PROPRANOLOL 10 MG TABLET: 10 | 30 days supply | Qty: 60 | Fill #1

## 2019-03-08 MED FILL — LITHIUM CARBONATE 300 MG CA: 300 | 30 days supply | Qty: 90 | Fill #1

## 2019-03-08 MED FILL — lamoTRIgine 200 MG TABS: 200 | 30 days supply | Qty: 60 | Fill #1

## 2019-03-08 MED FILL — QUETIAPINE FUMARATE 50 MG T: 50 | 30 days supply | Qty: 60 | Fill #1

## 2019-04-06 DIAGNOSIS — F3132 Bipolar disorder, current episode depressed, moderate: Secondary | ICD-10-CM | POA: Diagnosis not present

## 2019-04-08 MED FILL — PROPRANOLOL 10 MG TABLET: 10 | 30 days supply | Qty: 60 | Fill #2

## 2019-04-08 MED FILL — ARIPiprazole 2 MG TABS: 2 | 90 days supply | Qty: 30 | Fill #0

## 2019-04-08 MED FILL — LEVOTHYROXINE 125 MCG TAB: 125 | 30 days supply | Qty: 30 | Fill #2

## 2019-04-08 MED FILL — LITHIUM CARBONATE 300 MG CA: 300 | 30 days supply | Qty: 90 | Fill #2

## 2019-04-08 MED FILL — lamoTRIgine 200 MG TABS: 200 | 30 days supply | Qty: 60 | Fill #2

## 2019-05-19 NOTE — Telephone Encounter (Signed)
error 

## 2019-05-25 ENCOUNTER — Other Ambulatory Visit: Payer: Self-pay | Admitting: Nurse Practitioner

## 2019-05-25 MED FILL — hydrOXYzine HCL 25 MG TABS: 25 | 90 days supply | Qty: 90 | Fill #0

## 2019-05-25 MED FILL — lamoTRIgine 200 MG TABS: 200 | 90 days supply | Qty: 180 | Fill #0

## 2019-05-25 MED FILL — ATORVASTATIN CALCIUM 20 MG: 20 | 15 days supply | Qty: 15 | Fill #0

## 2019-05-25 MED FILL — OMEPRAZOLE 20 MG CAP: 20 | 90 days supply | Qty: 90 | Fill #1

## 2019-05-25 MED FILL — LEVOTHYROXINE 125 MCG TAB: 125 | 30 days supply | Qty: 30 | Fill #0

## 2019-05-25 MED FILL — PROPRANOLOL 10 MG TABLET: 10 | 90 days supply | Qty: 180 | Fill #0

## 2019-05-25 MED FILL — LITHIUM CARBONATE 300 MG CA: 300 | 90 days supply | Qty: 270 | Fill #0

## 2019-06-06 MED FILL — ATORVASTATIN CALCIUM 20 MG: 20 | 15 days supply | Qty: 15 | Fill #0

## 2019-06-27 ENCOUNTER — Other Ambulatory Visit: Payer: Self-pay | Admitting: Nurse Practitioner

## 2019-06-27 DIAGNOSIS — Z1231 Encounter for screening mammogram for malignant neoplasm of breast: Secondary | ICD-10-CM

## 2019-06-29 DIAGNOSIS — F3132 Bipolar disorder, current episode depressed, moderate: Secondary | ICD-10-CM | POA: Diagnosis not present

## 2019-07-05 ENCOUNTER — Other Ambulatory Visit: Payer: Self-pay | Admitting: Nurse Practitioner

## 2019-07-05 DIAGNOSIS — E782 Mixed hyperlipidemia: Secondary | ICD-10-CM

## 2019-07-05 MED FILL — busPIRone HCL 7.5 MG TABS: 7.5 | 30 days supply | Qty: 90 | Fill #0

## 2019-07-05 MED FILL — QUETIAPINE FUMARATE 100 MG: 100 | 30 days supply | Qty: 30 | Fill #0

## 2019-07-08 ENCOUNTER — Other Ambulatory Visit: Payer: Self-pay | Admitting: Nurse Practitioner

## 2019-07-08 DIAGNOSIS — E782 Mixed hyperlipidemia: Secondary | ICD-10-CM

## 2019-07-19 ENCOUNTER — Telehealth: Payer: Self-pay

## 2019-07-19 NOTE — Telephone Encounter (Signed)
Attempt to call patient to inform her appointment 07/20/2019 is a in person appointment. No answer and LVM.

## 2019-07-20 ENCOUNTER — Ambulatory Visit: Payer: Medicare HMO | Admitting: Nurse Practitioner

## 2019-07-27 ENCOUNTER — Ambulatory Visit: Payer: Medicare HMO

## 2019-08-16 IMAGING — CR DG LUMBAR SPINE COMPLETE 4+V
5 series · 5 of 5 positions shown · non-contrast
Comparison: None.

CLINICAL DATA: 56-year-old female chronic lower back pain and right
hip pain off and on for 30 years. Initial encounter.

EXAM:
LUMBAR SPINE - COMPLETE 4+ VIEW

[l-spine ap]
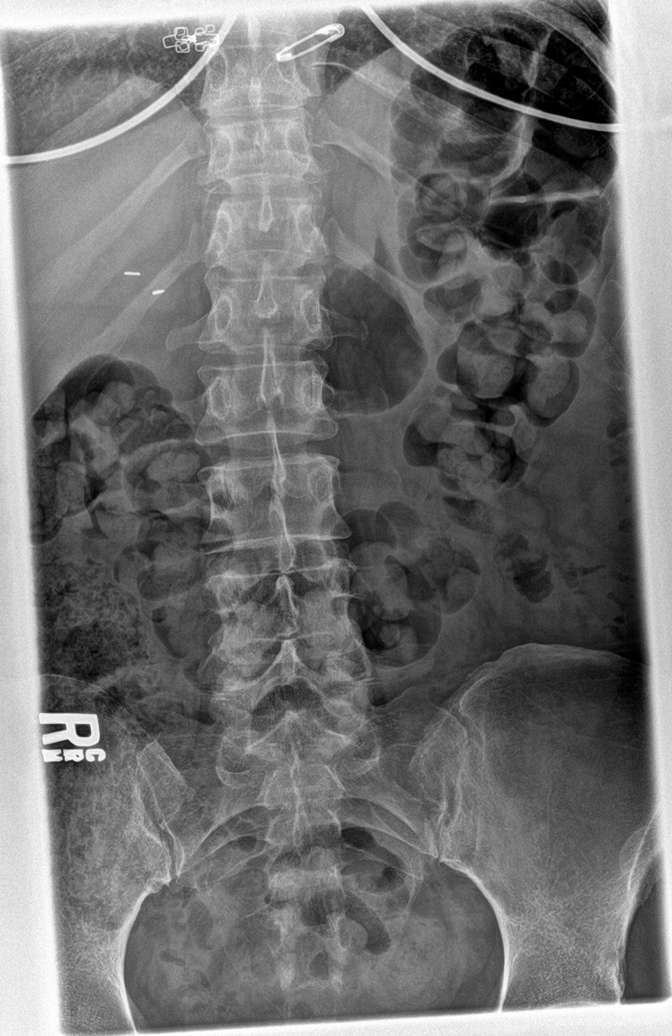

[l-spine obl (1 of 2)]
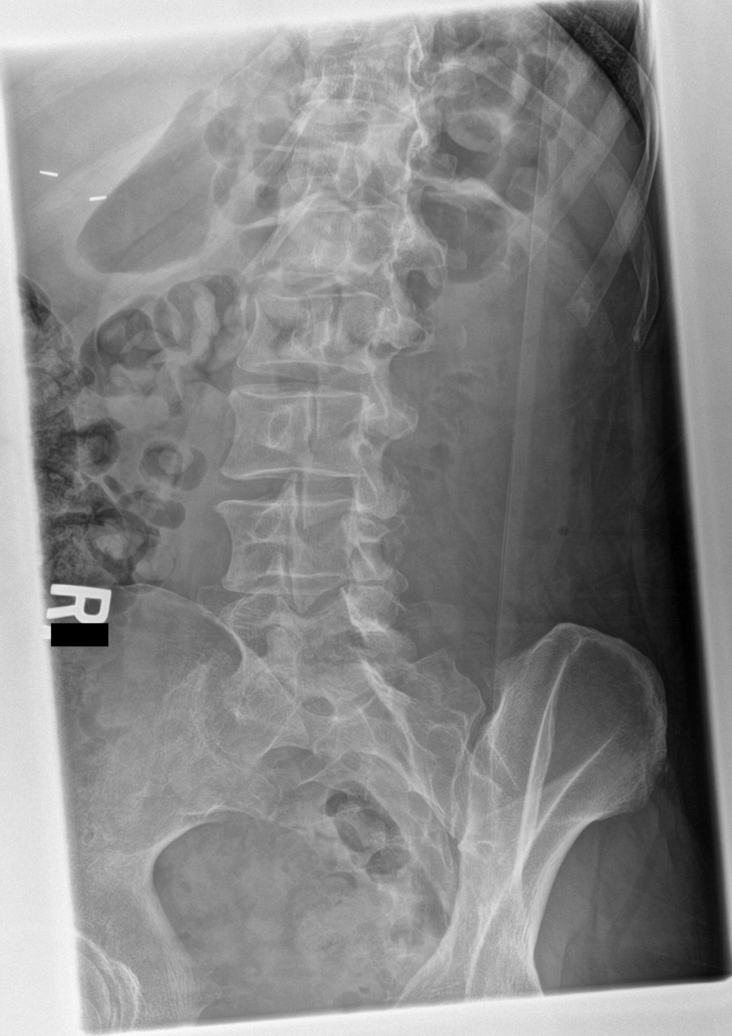

[l-spine obl (2 of 2)]
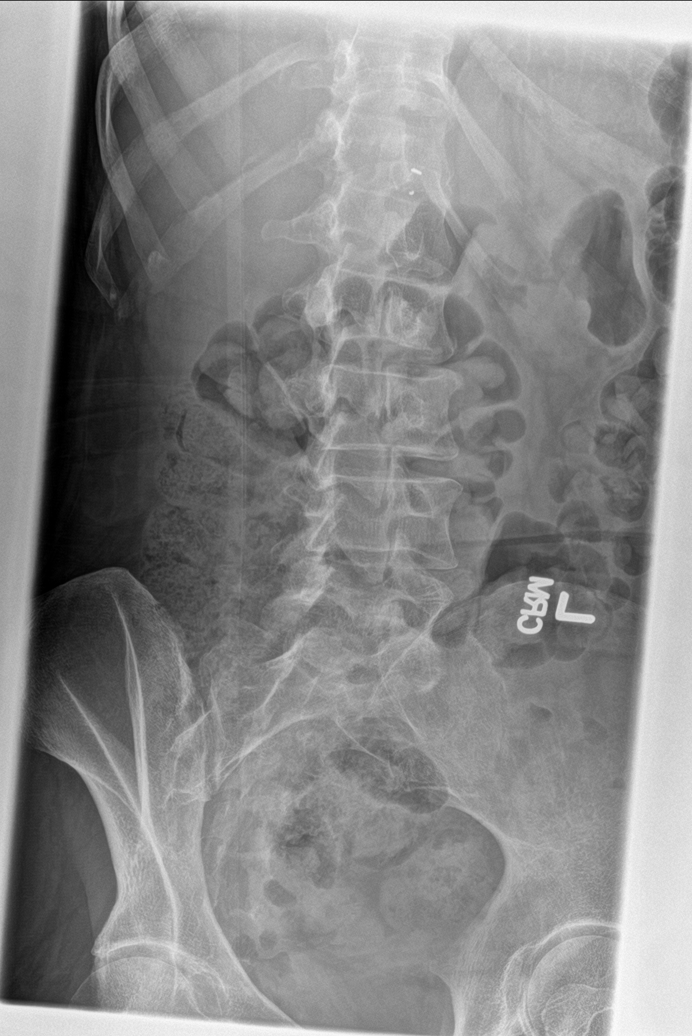

[l-spine lat]
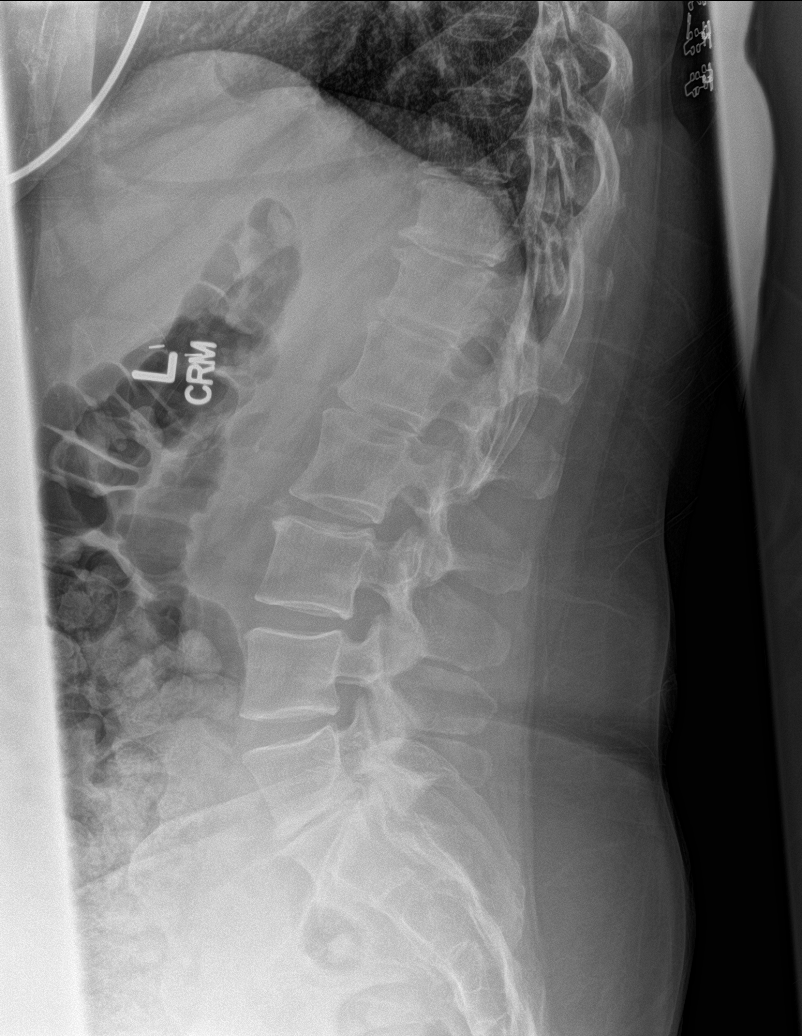

[l-spine spot]
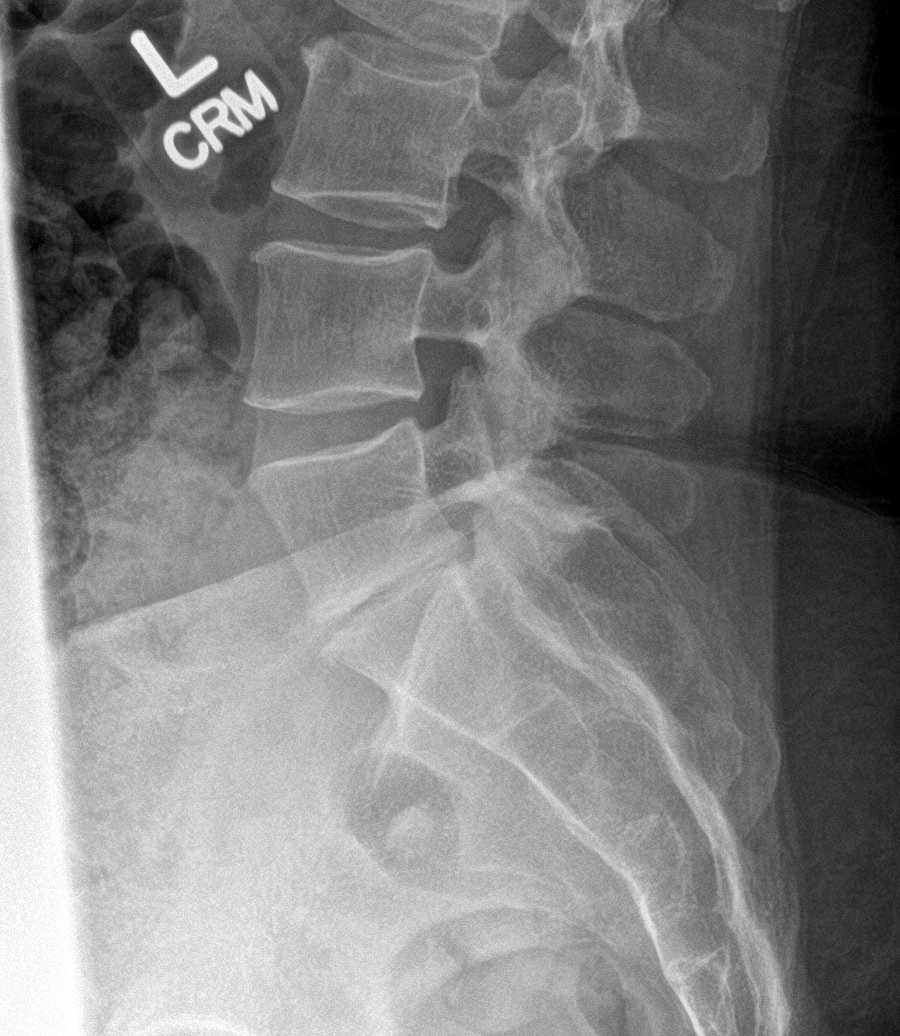

[5 of 5 positions shown; findings below may reference images not displayed]

FINDINGS: Minimal curvature lumbar spine convex right.

Marked L5-S1 disc space narrowing.

Mild T11-12 disc space narrowing and osteophyte.

Minimal T10-11 disc space narrowing and osteophyte.

No pars defect detected.
IMPRESSION: Marked L5-S1 disc space narrowing.

## 2019-08-24 ENCOUNTER — Other Ambulatory Visit: Payer: Self-pay | Admitting: Nurse Practitioner

## 2019-08-24 DIAGNOSIS — E782 Mixed hyperlipidemia: Secondary | ICD-10-CM

## 2019-09-15 DIAGNOSIS — F3132 Bipolar disorder, current episode depressed, moderate: Secondary | ICD-10-CM | POA: Diagnosis not present

## 2019-09-16 MED FILL — PROPRANOLOL 10 MG TABLET: 10 | 30 days supply | Qty: 60 | Fill #0

## 2019-09-16 MED FILL — LITHIUM CARBONATE 300 MG CA: 300 | 30 days supply | Qty: 90 | Fill #0

## 2019-09-16 MED FILL — lamoTRIgine 200 MG TABS: 200 | 30 days supply | Qty: 60 | Fill #0

## 2019-09-22 ENCOUNTER — Encounter: Payer: Self-pay | Admitting: Nurse Practitioner

## 2019-10-04 ENCOUNTER — Encounter (HOSPITAL_COMMUNITY): Payer: Self-pay | Admitting: Emergency Medicine

## 2019-10-04 ENCOUNTER — Emergency Department (HOSPITAL_COMMUNITY)
Admission: EM | Admit: 2019-10-04 | Discharge: 2019-10-05 | Disposition: A | Discharge status: Home / Self Care | Payer: Medicare HMO | Attending: Emergency Medicine | Admitting: Emergency Medicine

## 2019-10-04 ENCOUNTER — Emergency Department (HOSPITAL_COMMUNITY): Payer: Medicare HMO

## 2019-10-04 DIAGNOSIS — R296 Repeated falls: Secondary | ICD-10-CM | POA: Diagnosis not present

## 2019-10-04 DIAGNOSIS — M7989 Other specified soft tissue disorders: Secondary | ICD-10-CM | POA: Diagnosis not present

## 2019-10-04 DIAGNOSIS — M25512 Pain in left shoulder: Secondary | ICD-10-CM | POA: Insufficient documentation

## 2019-10-04 DIAGNOSIS — Z87891 Personal history of nicotine dependence: Secondary | ICD-10-CM | POA: Diagnosis not present

## 2019-10-04 DIAGNOSIS — E039 Hypothyroidism, unspecified: Secondary | ICD-10-CM | POA: Diagnosis not present

## 2019-10-04 DIAGNOSIS — Z809 Family history of malignant neoplasm, unspecified: Secondary | ICD-10-CM | POA: Insufficient documentation

## 2019-10-04 DIAGNOSIS — S8992XA Unspecified injury of left lower leg, initial encounter: Secondary | ICD-10-CM | POA: Diagnosis not present

## 2019-10-04 DIAGNOSIS — Z79899 Other long term (current) drug therapy: Secondary | ICD-10-CM | POA: Insufficient documentation

## 2019-10-04 DIAGNOSIS — G8929 Other chronic pain: Secondary | ICD-10-CM | POA: Diagnosis not present

## 2019-10-04 DIAGNOSIS — M19012 Primary osteoarthritis, left shoulder: Secondary | ICD-10-CM | POA: Diagnosis not present

## 2019-10-04 DIAGNOSIS — S8001XA Contusion of right knee, initial encounter: Secondary | ICD-10-CM | POA: Diagnosis not present

## 2019-10-04 DIAGNOSIS — S8011XA Contusion of right lower leg, initial encounter: Secondary | ICD-10-CM | POA: Diagnosis not present

## 2019-10-04 LAB — CBC
HCT: 41.2 % (ref 36.0–46.0)
Hemoglobin: 12.8 g/dL (ref 12.0–15.0)
MCH: 29.5 pg (ref 26.0–34.0)
MCHC: 31.1 g/dL (ref 30.0–36.0)
MCV: 94.9 fL (ref 80.0–100.0)
Platelets: 348 10*3/uL (ref 150–400)
RBC: 4.34 MIL/uL (ref 3.87–5.11)
RDW: 13.7 % (ref 11.5–15.5)
WBC: 9.5 10*3/uL (ref 4.0–10.5)
nRBC: 0 % (ref 0.0–0.2)

## 2019-10-04 LAB — BASIC METABOLIC PANEL
Anion gap: 10 (ref 5–15)
BUN: 8 mg/dL (ref 6–20)
CO2: 24 mmol/L (ref 22–32)
Calcium: 9.6 mg/dL (ref 8.9–10.3)
Chloride: 104 mmol/L (ref 98–111)
Creatinine, Ser: 1.12 mg/dL — ABNORMAL HIGH (ref 0.44–1.00)
GFR calc Af Amer: >60 mL/min (ref >60)
GFR calc non Af Amer: 54 mL/min — ABNORMAL LOW (ref >60)
Glucose, Bld: 119 mg/dL — ABNORMAL HIGH (ref 70–99)
Potassium: 3.8 mmol/L (ref 3.5–5.1)
Sodium: 138 mmol/L (ref 135–145)

## 2019-10-04 NOTE — ED Triage Notes (Signed)
Patient reports multiple falls over past several weeks which she thinks is related to her medication. Reports most recent fall 2 days ago landing on her knees. Presents with bruising to bilateral knees and pain to L shoulder. No deformities.

## 2019-10-05 LAB — LITHIUM LEVEL: Lithium Lvl: 0.61 mmol/L (ref 0.60–1.20)

## 2019-10-05 LAB — LIPID PANEL
Cholesterol: 287 mg/dL — ABNORMAL HIGH (ref 0–200)
HDL: 86 mg/dL (ref >40)
LDL Cholesterol: 172 mg/dL — ABNORMAL HIGH (ref 0–99)
Total CHOL/HDL Ratio: 3.3 RATIO
Triglycerides: 145 mg/dL (ref <150)
VLDL: 29 mg/dL (ref 0–40)

## 2019-10-05 LAB — TSH: TSH: 15.2 u[IU]/mL — ABNORMAL HIGH (ref 0.350–4.500)

## 2019-10-05 MED ORDER — DICLOFENAC SODIUM 1 % EX GEL: 2.0000 g | Freq: Four times a day (QID) | CUTANEOUS | 0 refills | Status: DC

## 2019-10-05 MED FILL — DICLOFENAC SODIUM 1% GEL: 1 | 12 days supply | Qty: 100 | Fill #0

## 2019-10-05 NOTE — ED Provider Notes (Signed)
[facility] EMERGENCY DEPARTMENT Provider Note   CSN: [id number] Arrival date & time: 10/04/19  1812     History Chief Complaint  Patient presents with  . Fall    Rachel Everett is a 59 y.o. female sending for evaluation of left shoulder pain and frequent falls.  Patient states of the past 4 to 6 months, she has had gradually worsening left shoulder pain.  It begins in her shoulder and goes to her upper arm.  She states her arm feels very heavy and weak.  She also has numbness/weakness of her thumb through middle finger on the left side.  She denies fall, trauma, or injury.  She states it feels very musculoskeletal.  She sees orthopedics at [facility], but has not followed up with them regarding her shoulder.  She has no pain at rest, pain only with movement and when her arm is hanging down.  She denies symptoms on the right side.  She denies neck pain.  She is not taking anything for her symptoms.  Additionally, patient reports increased unsteadiness and falls in the past week.  She has had 2 falls in the past week.  She did hit her head on Saturday, but did not lose consciousness.  She states she has always had frequent falls, but feels slightly more unsteady than normal.  She thinks this might be due to her lithium, or her propranolol dose might need to be increased.  She denies chest pain, shortness of breath, diaphoresis, or prodrome prior to the falls.  She denies recent fevers, chills, nausea, vomiting, dumping, urinary symptoms, abnormal bowel movements.   Additional history taken chart review.  Patient with a history of anxiety, bipolar, depression, GERD, hyperlipidemia, hypothyroidism.  HPI     Past Medical History:  Diagnosis Date  . Anxiety   . Bipolar 1 disorder (HCC)   . Depression   . GERD (gastroesophageal reflux disease)   . Hyperlipidemia   . SVD (spontaneous vaginal delivery)    x 1  . Thyroid disease     Patient Active Problem List    Diagnosis Date Noted  . Bipolar disorder, current episode mixed, moderate (HCC) 08/28/2017  . Mixed hyperlipidemia 11/24/2016  . Diabetes mellitus screening 05/14/2016  . Vitamin D deficiency 05/14/2016  . Hypothyroidism 09/11/2015  . Thyroid disease 09/11/2014    Past Surgical History:  Procedure Laterality Date  . ANKLE SURGERY     right   . ANTERIOR CRUCIATE LIGAMENT REPAIR Left   . BACK SURGERY     L5-S1  . CHOLECYSTECTOMY    . FRACTURE SURGERY Left    elbow  . TENDON REPAIR Right    angle  . TONSILLECTOMY       OB History   No obstetric history on file.     Family History  Problem Relation Age of Onset  . Cancer Mother   . Cancer Maternal Grandmother   . Colon cancer Neg Hx   . Esophageal cancer Neg Hx   . Rectal cancer Neg Hx   . Stomach cancer Neg Hx     Social History   Tobacco Use  . Smoking status: Former Smoker    Packs/day: 0.50    Years: 1.00    Pack years: 0.50    Types: Cigarettes    Quit date: 10/04/2015    Years since quitting: 4.0  . Smokeless tobacco: Never Used  Vaping Use  . Vaping Use: Former  Substance Use Topics  . Alcohol use:  Yes    Alcohol/week: 0.0 standard drinks    Comment: rare  . Drug use: No    Home Medications Prior to Admission medications   Medication Sig Start Date End Date Taking? Authorizing Provider  atorvastatin (LIPITOR) 20 MG tablet Take 1 tablet (20 mg total) by mouth daily. 01/28/19 04/28/19  Claiborne Rigg, NP  diclofenac Sodium (VOLTAREN) 1 % GEL Apply 2 g topically 4 (four) times daily. 10/05/19   Rufus Beske, PA-C  lamoTRIgine (LAMICTAL) 200 MG tablet Take 200 mg by mouth 2 (two) times daily.    [provider]  levothyroxine (SYNTHROID) 125 MCG tablet Take 1 tablet (125 mcg total) by mouth daily. Must have office visit for refills 05/25/19   Claiborne Rigg, NP  lithium carbonate (LITHOBID) 300 MG CR tablet Take 300 mg by mouth 3 (three) times daily.  06/27/14   [provider]    omeprazole (PRILOSEC) 20 MG capsule Take 1 capsule (20 mg total) by mouth daily. 01/28/19 04/28/19  Claiborne Rigg, NP  oxyCODONE-acetaminophen (PERCOCET/ROXICET) 5-325 MG tablet Take 1 tablet by mouth every 4 (four) hours as needed for severe pain. 07/11/18   Azalia Bilis, MD  propranolol (INDERAL) 10 MG tablet  04/19/18   [provider]  QUEtiapine (SEROQUEL XR) 50 MG TB24 24 hr tablet Take 50 mg by mouth daily as needed.    [provider]    Allergies    Patient has no known allergies.  Review of Systems   Review of Systems  Musculoskeletal: Positive for arthralgias and myalgias.       Frequent falls  All other systems reviewed and are negative.   Physical Exam Updated Vital Signs BP 136/89   Pulse 96   Temp 97.9 F (36.6 C) (Oral)   Resp 17   Ht 5\' 6"  (1.676 m)   Wt 79.4 kg   SpO2 98%   BMI 28.25 kg/m   Physical Exam Vitals and nursing note reviewed.  Constitutional:      General: She is not in acute distress.    Appearance: She is well-developed.     Comments: Resting in the bed in no acute distress  HENT:     Head: Normocephalic and atraumatic.  Eyes:     Extraocular Movements: Extraocular movements intact.     Conjunctiva/sclera: Conjunctivae normal.     Pupils: Pupils are equal, round, and reactive to light.  Cardiovascular:     Rate and Rhythm: Normal rate and regular rhythm.     Pulses: Normal pulses.  Pulmonary:     Effort: Pulmonary effort is normal. No respiratory distress.     Breath sounds: Normal breath sounds. No wheezing.  Abdominal:     General: There is no distension.     Palpations: Abdomen is soft. There is no mass.     Tenderness: There is no abdominal tenderness. There is no guarding or rebound.  Musculoskeletal:        General: Normal range of motion.     Cervical back: Normal range of motion and neck supple.     Comments: No obvious deformity, erythema, or signs of trauma of the left shoulder.  Tenderness palpation  of the left shoulder and upper arm.  Radial pulses 2+ bilaterally.  Grip strength equal bilaterally.  Full active range of motion of the arm with pain.  No pain at rest.  When distracted, patient able to move her arm without splinting or signs of pain.  Skin:  General: Skin is warm and dry.     Capillary Refill: Capillary refill takes less than 2 seconds.  Neurological:     Mental Status: She is alert and oriented to person, place, and time.     GCS: GCS eye subscore is 4. GCS verbal subscore is 5. GCS motor subscore is 6.     Cranial Nerves: Cranial nerves are intact.     Sensory: Sensation is intact.     Motor: Motor function is intact.     Coordination: Coordination is intact.     Gait: Gait is intact.     Comments: CN intact.  Nose to finger intact.  Fine movement and coronation intact.  No ataxic gait     ED Results / Procedures / Treatments   Labs (all labs ordered are listed, but only abnormal results are displayed) Labs Reviewed  BASIC METABOLIC PANEL - Abnormal; Notable for the following components:      Result Value   Glucose, Bld 119 (*)    Creatinine, Ser 1.12 (*)    GFR calc non Af Amer 54 (*)    All other components within normal limits  TSH - Abnormal; Notable for the following components:   TSH 15.200 (*)    All other components within normal limits  LIPID PANEL - Abnormal; Notable for the following components:   Cholesterol 287 (*)    LDL Cholesterol 172 (*)    All other components within normal limits  CBC  LITHIUM LEVEL    EKG None  Radiology DG Shoulder Left  Result Date: 10/04/2019 CLINICAL DATA:  Left shoulder pain history of fall EXAM: LEFT SHOULDER - 2+ VIEW COMPARISON:  None. FINDINGS: No fracture or malalignment.  Mild AC joint degenerative change. IMPRESSION: No acute osseous abnormality. Electronically Signed   By: Jasmine PangKim  Fujinaga M.D.   On: 10/04/2019 19:47   DG Knee Complete 4 Views Left  Result Date: 10/04/2019 CLINICAL DATA:  Fall EXAM:  LEFT KNEE - COMPLETE 4+ VIEW COMPARISON:  07/11/2018 FINDINGS: No fracture or malalignment. Changes consistent with prior ACL repair. Mild degenerative change at the medial joint space. No significant knee effusion. IMPRESSION: No acute osseous abnormality. Electronically Signed   By: Jasmine PangKim  Fujinaga M.D.   On: 10/04/2019 19:48   DG Knee Complete 4 Views Right  Result Date: 10/04/2019 CLINICAL DATA:  Bruising to the medial knee EXAM: RIGHT KNEE - COMPLETE 4+ VIEW COMPARISON:  02/06/2014 FINDINGS: No evidence of fracture, dislocation, or joint effusion. No evidence of arthropathy or other focal bone abnormality. Pre and infrapatellar soft tissue swelling. IMPRESSION: Soft tissue swelling. No acute osseous abnormality. Electronically Signed   By: Jasmine PangKim  Fujinaga M.D.   On: 10/04/2019 19:50    Procedures Procedures (including critical care time)  Medications Ordered in ED Medications - No data to display  ED Course  I have reviewed the triage vital signs and the nursing notes.  Pertinent labs & imaging results that were available during my care of the patient were reviewed by me and considered in my medical decision making (see chart for details).    MDM Rules/Calculators/A&P                          Pt presenting for evaluation of L shoulder pain and frequent falls.  On exam, patient is without focal neurologic deficit.  Gross left upper extremity strength intact.  Pain is worse with movement, but no pain at rest.  This is  been going on for several months.  Likely impingement/radiculopathy.  X-ray obtained in triage read interpreted by me, no fracture dislocation.  Patient is requesting an MRI.  I discussed that she will need an outpatient MRI with orthopedics for further management, as there is no indication for an emergent MRI today.  Encouraged continued sling use, will use Voltaren gel to help with pain control.  Additionally, patient reporting slight increase in unsteadiness and falls.  She  does not have an ataxic gait.  X-rays obtained from triage of both knees viewed interpreted by me, no fracture dislocation.  Labs obtained from triage without obvious electrolyte abnormality.  Patient requesting lithium level, TSH, lipid panel.  As she had an abnormal TSH previously, will order.  Lithium pending.  Patient requesting to be discharged.  TSH is elevated, indicating patient is still hypothyroid.  Discussed that she will need to follow-up with her PCP for medication management of her hypothyroidism.  Discussed lithium is still pending, but she can follow-up with her doctor regarding this level as well.  At this time, patient appears safe for discharge.  Return precautions given.  Patient states she understands and agrees to plan.  Final Clinical Impression(s) / ED Diagnoses Final diagnoses:  Chronic left shoulder pain  Frequent falls  Hypothyroidism, unspecified type    Rx / DC Orders ED Discharge Orders         Ordered    diclofenac Sodium (VOLTAREN) 1 % GEL  4 times daily     Discontinue  Reprint     10/05/19 0931           Alveria Apley, PA-C 10/05/19 8676    Gwyneth Sprout, MD 10/05/19 [phone]

## 2019-10-05 NOTE — ED Notes (Signed)
Sophia at bedside.

## 2019-10-05 NOTE — ED Notes (Signed)
Pt called by night shift tech, no response.

## 2019-10-05 NOTE — Discharge Instructions (Signed)
Your thyroid level once again showed that your thyroid function is low.  It is better than previous, but still not back to normal.  You need to follow-up with your primary care doctor for further medication changes. Follow-up with your primary care doctor as needed for further evaluation of your falls. Call the orthopedic office to set up an appointment for management of your left shoulder pain.  Continue wearing the sling as needed for pain. Use Voltaren gel to help with pain control.  You also may take Tylenol as needed. Return to the emergency room with any new, worsening, concerning symptoms.

## 2019-10-06 ENCOUNTER — Telehealth: Payer: Self-pay | Admitting: Nurse Practitioner

## 2019-10-06 DIAGNOSIS — E782 Mixed hyperlipidemia: Secondary | ICD-10-CM

## 2019-10-06 NOTE — Telephone Encounter (Signed)
Patient is calling stating that she was in the hospital labs were drawn. And seeing if Atorvastatin can be adjusted based on the lab work. Cb- [phone]

## 2019-10-07 ENCOUNTER — Other Ambulatory Visit: Payer: Self-pay | Admitting: Nurse Practitioner

## 2019-10-07 DIAGNOSIS — E782 Mixed hyperlipidemia: Secondary | ICD-10-CM

## 2019-10-07 MED FILL — OMEPRAZOLE 20 MG CAP: 20 | 90 days supply | Qty: 90 | Fill #2

## 2019-10-07 NOTE — Telephone Encounter (Signed)
Requested medication (s) are due for refill today:no  Requested medication (s) are on the active medication list:yes  Last refill:  05/25/2019  Future visit scheduled: no  Notes to clinic:  Patient given 30 day courtesy refill No appointment scheduled   Requested Prescriptions  Pending Prescriptions Disp Refills   atorvastatin (LIPITOR) 20 MG tablet [Pharmacy Med Name: ATORVASTATIN CALCIUM 20 MG 20 Tablet] 15 tablet 0    Sig: TAKE 1 TABLET (20 MG TOTAL) BY MOUTH DAILY.      Cardiovascular:  Antilipid - Statins Failed - 10/07/2019  3:07 PM      Failed - Total Cholesterol in normal range and within 360 days    Cholesterol, Total  Date Value Ref Range Status  04/22/2018 145 100 - 199 mg/dL Final   Cholesterol  Date Value Ref Range Status  10/05/2019 287 (H) 0 - 200 mg/dL Final          Failed - LDL in normal range and within 360 days    LDL Calculated  Date Value Ref Range Status  04/22/2018 62 0 - 99 mg/dL Final   LDL Cholesterol  Date Value Ref Range Status  10/05/2019 172 (H) 0 - 99 mg/dL Final    Comment:           Total Cholesterol/HDL:CHD Risk Coronary Heart Disease Risk Table                     Men   Women  1/2 Average Risk   3.4   3.3  Average Risk       5.0   4.4  2 X Average Risk   9.6   7.1  3 X Average Risk  23.4   11.0        Use the calculated Patient Ratio above and the CHD Risk Table to determine the patient's CHD Risk.        ATP III CLASSIFICATION (LDL):  <100     mg/dL   Optimal  149-702  mg/dL   Near or Above                    Optimal  130-159  mg/dL   Borderline  637-858  mg/dL   High  >850     mg/dL   Very High Performed at [facility], [address] [place], [place] [address]           Passed - HDL in normal range and within 360 days    HDL  Date Value Ref Range Status  10/05/2019 86 >40 mg/dL Final  28/78/6767 64 >20 mg/dL Final          Passed - Triglycerides in normal range and within 360 days    Triglycerides   Date Value Ref Range Status  10/05/2019 145 <150 mg/dL Final          Passed - Patient is not pregnant      Passed - Valid encounter within last 12 months    Recent Outpatient Visits           8 months ago Acquired hypothyroidism   [facility] [place], Shea Stakes, NP   1 year ago Acquired hypothyroidism   [facility] [place], [place], [place]   2 years ago Acquired hypothyroidism   [facility] [place], [place] W, NP   2 years ago Upper respiratory tract infection, unspecified type  [facility] [place], [place], FNP   2 years ago Well woman exam with routine gynecological exam   [facility] [place], [place], [place]                levothyroxine (SYNTHROID) 125 MCG tablet [Pharmacy Med Name: LEVOTHYROXINE 125 MCG TAB 125 Tablet] 30 tablet 0    Sig: TAKE 1 TABLET (125 MCG TOTAL) BY MOUTH DAILY. MUST HAVE OFFICE VISIT FOR REFILLS      Endocrinology:  Hypothyroid Agents Failed - 10/07/2019  3:07 PM      Failed - TSH needs to be rechecked within 3 months after an abnormal result. Refill until TSH is due.      Failed - TSH in normal range and within 360 days    TSH  Date Value Ref Range Status  10/05/2019 15.200 (H) 0.350 - 4.500 uIU/mL Final    Comment:    Performed by a 3rd Generation assay with a functional sensitivity of <=0.01 uIU/mL. Performed at [facility], [address], [place], [place] [address]   01/31/2019 33.900 (H) 0.450 - 4.500 uIU/mL Final          Passed - Valid encounter within last 12 months    Recent Outpatient Visits           8 months ago Acquired hypothyroidism   [place] [place], Shea Stakes, NP   1 year ago Acquired hypothyroidism   [facility] [place], [place], [place]   2 years ago Acquired hypothyroidism   [place] [address] [facility] [place], Shea Stakes, NP   2 years ago Upper respiratory tract infection, unspecified type   [facility] [place], [place] Beckmann, FNP   2 years ago Well woman exam with routine gynecological exam   [facility] [place], [place] Beck[place], [place]

## 2019-10-07 NOTE — Telephone Encounter (Signed)
Patient was called and stated that she just needed her pcp to review her lab work from her hospital visit and adjust listed medications accordingly. Please follow up at your earliest convenience.  atorvastatin (LIPITOR) 20 MG tablet [[id number]] ENDED levothyroxine (SYNTHROID) 125 MCG tablet [[id number]]

## 2019-10-08 MED ORDER — LEVOTHYROXINE SODIUM 137 MCG PO TABS: 137.0000 ug | ORAL_TABLET | Freq: Every day | ORAL | 0 refills | Status: DC

## 2019-10-08 MED ORDER — ATORVASTATIN CALCIUM 40 MG PO TABS: 20.0000 mg | ORAL_TABLET | Freq: Every day | ORAL | 0 refills | Status: DC

## 2019-10-08 NOTE — Telephone Encounter (Signed)
I have sent increased doses of Atorvastatin and Levothyroxine to her Pharmacy based on her lab result. Levothyroxine should be taken first thing in the morning on an empty stomach

## 2019-10-10 MED FILL — ATORVASTATIN CALCIUM 40 MG: 40 | 90 days supply | Qty: 90 | Fill #0

## 2019-10-10 MED FILL — LEVOTHYROXINE 137 MCG TAB: 137 | 90 days supply | Qty: 90 | Fill #0

## 2019-10-13 NOTE — Telephone Encounter (Signed)
Spoke to patient and informed on Rx. Pt. Understood.  

## 2019-10-24 MED FILL — LITHIUM CARBONATE 300 MG CA: 300 | 30 days supply | Qty: 90 | Fill #1

## 2019-10-24 MED FILL — PROPRANOLOL 10 MG TABLET: 10 | 30 days supply | Qty: 60 | Fill #1

## 2019-10-24 MED FILL — lamoTRIgine 200 MG TABS: 200 | 30 days supply | Qty: 60 | Fill #1

## 2019-10-25 MED FILL — QUETIAPINE FUMARATE 100 MG: 100 | 30 days supply | Qty: 30 | Fill #0

## 2019-12-01 ENCOUNTER — Other Ambulatory Visit: Payer: Self-pay | Admitting: Family Medicine

## 2019-12-01 DIAGNOSIS — E782 Mixed hyperlipidemia: Secondary | ICD-10-CM

## 2019-12-01 MED FILL — LITHIUM CARBONATE 300 MG CA: 300 | 30 days supply | Qty: 90 | Fill #2

## 2019-12-01 MED FILL — lamoTRIgine 200 MG TABS: 200 | 30 days supply | Qty: 60 | Fill #2

## 2019-12-01 MED FILL — QUETIAPINE FUMARATE 100 MG: 100 | 30 days supply | Qty: 30 | Fill #1

## 2019-12-01 MED FILL — PROPRANOLOL 10 MG TABLET: 10 | 30 days supply | Qty: 60 | Fill #2

## 2019-12-01 NOTE — Telephone Encounter (Signed)
Requested medication (s) are due for refill today -no  Requested medication (s) are on the active medication list -yes  Future visit scheduled -no  Last refill: 10/10/19  Notes to clinic: Patient had dose increases on these requested medications- she does not have follow up appointment/lab- sent for review and possible appointment.  Requested Prescriptions  Pending Prescriptions Disp Refills   levothyroxine (SYNTHROID) 137 MCG tablet [Pharmacy Med Name: LEVOTHYROXINE 137 MCG TAB 137 Tablet] 90 tablet 0    Sig: Take 1 tablet (137 mcg total) by mouth daily.      Endocrinology:  Hypothyroid Agents Failed - 12/01/2019 12:20 PM      Failed - TSH needs to be rechecked within 3 months after an abnormal result. Refill until TSH is due.      Failed - TSH in normal range and within 360 days    TSH  Date Value Ref Range Status  10/05/2019 15.200 (H) 0.350 - 4.500 uIU/mL Final    Comment:    Performed by a 3rd Generation assay with a functional sensitivity of <=0.01 uIU/mL. Performed at [facility], [address] [place], [place] [address]   01/31/2019 33.900 (H) 0.450 - 4.500 uIU/mL Final          Passed - Valid encounter within last 12 months    Recent Outpatient Visits           10 months ago Acquired hypothyroidism   [facility] [place], Shea Stakes, NP   1 year ago Acquired hypothyroidism   [place] [address] [facility] [place], [place], [place]   2 years ago Acquired hypothyroidism   [facility] [place], Shea Stakes, NP   2 years ago Upper respiratory tract infection, unspecified type   [facility] [place], Oren Beckmann, FNP   2 years ago Well woman exam with routine gynecological exam   [facility] [place], [place] R, FNP                atorvastatin (LIPITOR) 40 MG tablet [Pharmacy Med Name: ATORVASTATIN CALCIUM 40 MG 40 Tablet] 90 tablet 0     Sig: Take 0.5 tablets (20 mg total) by mouth daily.      Cardiovascular:  Antilipid - Statins Failed - 12/01/2019 12:20 PM      Failed - Total Cholesterol in normal range and within 360 days    Cholesterol, Total  Date Value Ref Range Status  04/22/2018 145 100 - 199 mg/dL Final   Cholesterol  Date Value Ref Range Status  10/05/2019 287 (H) 0 - 200 mg/dL Final          Failed - LDL in normal range and within 360 days    LDL Calculated  Date Value Ref Range Status  04/22/2018 62 0 - 99 mg/dL Final   LDL Cholesterol  Date Value Ref Range Status  10/05/2019 172 (H) 0 - 99 mg/dL Final    Comment:           Total Cholesterol/HDL:CHD Risk Coronary Heart Disease Risk Table                     Men   Women  1/2 Average Risk   3.4   3.3  Average Risk       5.0   4.4  2 X Average Risk   9.6   7.1  3 X Average Risk  23.4   11.0        Use the calculated Patient Ratio above and the CHD Risk Table to determine the patient's CHD Risk.        ATP III CLASSIFICATION (LDL):  <100     mg/dL   Optimal  093-267  mg/dL   Near or Above                    Optimal  130-159  mg/dL   Borderline  124-580  mg/dL   High  >998     mg/dL   Very High Performed at [facility], [address] [place], [place] [address]           Passed - HDL in normal range and within 360 days    HDL  Date Value Ref Range Status  10/05/2019 86 >40 mg/dL Final  05/39/7673 64 >41 mg/dL Final          Passed - Triglycerides in normal range and within 360 days    Triglycerides  Date Value Ref Range Status  10/05/2019 145 <150 mg/dL Final          Passed - Patient is not pregnant      Passed - Valid encounter within last 12 months    Recent Outpatient Visits           10 months ago Acquired hypothyroidism   [facility] [place], Shea Stakes, NP   1 year ago Acquired hypothyroidism   [facility] [place], [place], [place]   2 years ago  Acquired hypothyroidism   [facility] [place], Shea Stakes, NP   2 years ago Upper respiratory tract infection, unspecified type   [facility] [place], Oren Beckmann, FNP   2 years ago Well woman exam with routine gynecological exam   [facility] [place], [place], [place]                  Requested Prescriptions  Pending Prescriptions Disp Refills   levothyroxine (SYNTHROID) 137 MCG tablet [Pharmacy Med Name: LEVOTHYROXINE 137 MCG TAB 137 Tablet] 90 tablet 0    Sig: Take 1 tablet (137 mcg total) by mouth daily.      Endocrinology:  Hypothyroid Agents Failed - 12/01/2019 12:20 PM      Failed - TSH needs to be rechecked within 3 months after an abnormal result. Refill until TSH is due.      Failed - TSH in normal range and within 360 days    TSH  Date Value Ref Range Status  10/05/2019 15.200 (H) 0.350 - 4.500 uIU/mL Final    Comment:    Performed by a 3rd Generation assay with a functional sensitivity of <=0.01 uIU/mL. Performed at [facility], [address] [place], [place] [address]   01/31/2019 33.900 (H) 0.450 - 4.500 uIU/mL Final          Passed - Valid encounter within last 12 months    Recent Outpatient Visits           10 months ago Acquired hypothyroidism   [facility] [place], Shea Stakes, NP   1 year ago Acquired hypothyroidism   [facility] [place], [place], [place]   2 years ago Acquired hypothyroidism   [facility] [place], Shea Stakes, NP  2 years ago Upper respiratory tract infection, unspecified type   [facility] [place], [place] R, FNP   2 years ago Well woman exam with routine gynecological exam   [facility] [place], [place] R, FNP                atorvastatin (LIPITOR) 40 MG tablet [Pharmacy Med Name:  ATORVASTATIN CALCIUM 40 MG 40 Tablet] 90 tablet 0    Sig: Take 0.5 tablets (20 mg total) by mouth daily.      Cardiovascular:  Antilipid - Statins Failed - 12/01/2019 12:20 PM      Failed - Total Cholesterol in normal range and within 360 days    Cholesterol, Total  Date Value Ref Range Status  04/22/2018 145 100 - 199 mg/dL Final   Cholesterol  Date Value Ref Range Status  10/05/2019 287 (H) 0 - 200 mg/dL Final          Failed - LDL in normal range and within 360 days    LDL Calculated  Date Value Ref Range Status  04/22/2018 62 0 - 99 mg/dL Final   LDL Cholesterol  Date Value Ref Range Status  10/05/2019 172 (H) 0 - 99 mg/dL Final    Comment:           Total Cholesterol/HDL:CHD Risk Coronary Heart Disease Risk Table                     Men   Women  1/2 Average Risk   3.4   3.3  Average Risk       5.0   4.4  2 X Average Risk   9.6   7.1  3 X Average Risk  23.4   11.0        Use the calculated Patient Ratio above and the CHD Risk Table to determine the patient's CHD Risk.        ATP III CLASSIFICATION (LDL):  <100     mg/dL   Optimal  453-646  mg/dL   Near or Above                    Optimal  130-159  mg/dL   Borderline  803-212  mg/dL   High  >248     mg/dL   Very High Performed at [facility], [address], [place], [place] [address]           Passed - HDL in normal range and within 360 days    HDL  Date Value Ref Range Status  10/05/2019 86 >40 mg/dL Final  70/48/8891 64 >69 mg/dL Final          Passed - Triglycerides in normal range and within 360 days    Triglycerides  Date Value Ref Range Status  10/05/2019 145 <150 mg/dL Final          Passed - Patient is not pregnant      Passed - Valid encounter within last 12 months    Recent Outpatient Visits           10 months ago Acquired hypothyroidism   [facility] [place], Shea Stakes, NP   1 year ago Acquired hypothyroidism   [facility] [place], [place], [place]   2 years ago Acquired hypothyroidism   [facility] [place], Shea Stakes, NP   2 years ago  Upper respiratory tract infection, unspecified type   [facility] [place], [place], FNP   2 years ago Well woman exam with routine gynecological exam   [facility] [place], Oren Beckmann[place]andes[place] R, [place]

## 2019-12-08 DIAGNOSIS — F3132 Bipolar disorder, current episode depressed, moderate: Secondary | ICD-10-CM | POA: Diagnosis not present

## 2020-01-05 ENCOUNTER — Other Ambulatory Visit: Payer: Self-pay | Admitting: Family Medicine

## 2020-01-05 DIAGNOSIS — E782 Mixed hyperlipidemia: Secondary | ICD-10-CM

## 2020-01-05 MED FILL — LITHIUM CARBONATE 300 MG CA: 300 | 30 days supply | Qty: 90 | Fill #0

## 2020-01-05 MED FILL — ATORVASTATIN CALCIUM 40 MG: 40 | 30 days supply | Qty: 15 | Fill #0

## 2020-01-05 MED FILL — OMEPRAZOLE 20 MG CAP: 20 | 30 days supply | Qty: 30 | Fill #0

## 2020-01-05 MED FILL — PROPRANOLOL 10 MG TABLET: 10 | 30 days supply | Qty: 60 | Fill #0

## 2020-01-05 MED FILL — hydrOXYzine HCL 50 MG TABS: 50 | 30 days supply | Qty: 30 | Fill #0

## 2020-01-05 MED FILL — QUETIAPINE FUMARATE 100 MG: 100 | 30 days supply | Qty: 30 | Fill #0

## 2020-01-05 MED FILL — lamoTRIgine 200 MG TABS: 200 | 30 days supply | Qty: 60 | Fill #0

## 2020-02-01 MED FILL — LITHIUM CARBONATE 300 MG CA: 300 | 30 days supply | Qty: 90 | Fill #1

## 2020-02-01 MED FILL — PROPRANOLOL 10 MG TABLET: 10 | 30 days supply | Qty: 60 | Fill #1

## 2020-02-01 MED FILL — hydrOXYzine HCL 50 MG TABS: 50 | 30 days supply | Qty: 30 | Fill #1

## 2020-02-01 MED FILL — QUETIAPINE FUMARATE 100 MG: 100 | 30 days supply | Qty: 30 | Fill #1

## 2020-02-01 MED FILL — lamoTRIgine 200 MG TABS: 200 | 30 days supply | Qty: 60 | Fill #1

## 2020-02-23 DIAGNOSIS — F3132 Bipolar disorder, current episode depressed, moderate: Secondary | ICD-10-CM | POA: Diagnosis not present

## 2020-03-15 ENCOUNTER — Other Ambulatory Visit: Payer: Self-pay | Admitting: Family Medicine

## 2020-03-15 ENCOUNTER — Other Ambulatory Visit: Payer: Self-pay | Admitting: Nurse Practitioner

## 2020-03-15 DIAGNOSIS — K219 Gastro-esophageal reflux disease without esophagitis: Secondary | ICD-10-CM

## 2020-03-15 MED FILL — LEVOTHYROXINE 137 MCG TAB: 137 | 90 days supply | Qty: 90 | Fill #0

## 2020-03-15 NOTE — Telephone Encounter (Signed)
Patient noted to have lab work from ED visit on 10/04/19. Medications adjusted in telephone encounter on 10/06/19. Refill provided

## 2020-03-19 ENCOUNTER — Other Ambulatory Visit: Payer: Self-pay | Admitting: Nurse Practitioner

## 2020-03-19 DIAGNOSIS — K219 Gastro-esophageal reflux disease without esophagitis: Secondary | ICD-10-CM

## 2020-05-04 DIAGNOSIS — F3132 Bipolar disorder, current episode depressed, moderate: Secondary | ICD-10-CM | POA: Diagnosis not present

## 2020-07-16 ENCOUNTER — Other Ambulatory Visit: Payer: Self-pay | Admitting: Nurse Practitioner

## 2020-07-16 DIAGNOSIS — Z1231 Encounter for screening mammogram for malignant neoplasm of breast: Secondary | ICD-10-CM

## 2020-08-01 DIAGNOSIS — F3132 Bipolar disorder, current episode depressed, moderate: Secondary | ICD-10-CM | POA: Diagnosis not present

## 2020-09-25 ENCOUNTER — Other Ambulatory Visit (HOSPITAL_BASED_OUTPATIENT_CLINIC_OR_DEPARTMENT_OTHER): Payer: Self-pay

## 2020-09-28 ENCOUNTER — Other Ambulatory Visit: Payer: Self-pay | Admitting: Family Medicine

## 2020-09-28 ENCOUNTER — Other Ambulatory Visit: Payer: Self-pay

## 2020-09-28 DIAGNOSIS — E782 Mixed hyperlipidemia: Secondary | ICD-10-CM

## 2020-09-28 NOTE — Telephone Encounter (Signed)
Notes to clinic:  Script requested has expired  Review for continued use  Message has been sent for patient to contact office    Requested Prescriptions  Pending Prescriptions Disp Refills   atorvastatin (LIPITOR) 40 MG tablet 90 tablet 0    Sig: TAKE 0.5 TABLETS (20 MG TOTAL) BY MOUTH DAILY.      Cardiovascular:  Antilipid - Statins Failed - 09/28/2020  2:19 PM      Failed - Total Cholesterol in normal range and within 360 days    Cholesterol, Total  Date Value Ref Range Status  04/22/2018 145 100 - 199 mg/dL Final   Cholesterol  Date Value Ref Range Status  10/05/2019 287 (H) 0 - 200 mg/dL Final          Failed - LDL in normal range and within 360 days    LDL Calculated  Date Value Ref Range Status  04/22/2018 62 0 - 99 mg/dL Final   LDL Cholesterol  Date Value Ref Range Status  10/05/2019 172 (H) 0 - 99 mg/dL Final    Comment:           Total Cholesterol/HDL:CHD Risk Coronary Heart Disease Risk Table                     Men   Women  1/2 Average Risk   3.4   3.3  Average Risk       5.0   4.4  2 X Average Risk   9.6   7.1  3 X Average Risk  23.4   11.0        Use the calculated Patient Ratio above and the CHD Risk Table to determine the patient's CHD Risk.        ATP III CLASSIFICATION (LDL):  <100     mg/dL   Optimal  222-979  mg/dL   Near or Above                    Optimal  130-159  mg/dL   Borderline  892-119  mg/dL   High  >417     mg/dL   Very High Performed at [facility], [address], [place], [place] [address]           Failed - Valid encounter within last 12 months    Recent Outpatient Visits           1 year ago Acquired hypothyroidism   [facility] [place], Shea Stakes, NP   2 years ago Acquired hypothyroidism   [facility] [place], [place], [place]   3 years ago Acquired hypothyroidism   [facility] [place], Shea Stakes, NP   3 years ago Upper  respiratory tract infection, unspecified type   [facility] [place], Oren Beckmann, FNP   3 years ago Well woman exam with routine gynecological exam   [facility] [place], [place], [place]                Passed - HDL in normal range and within 360 days    HDL  Date Value Ref Range Status  10/05/2019 86 >40 mg/dL Final  48/18/5631 64 >49 mg/dL Final          Passed - Triglycerides in normal range and within 360 days    Triglycerides  Date Value Ref Range Status  10/05/2019  145 <150 mg/dL Final          Passed - Patient is not pregnant

## 2020-10-01 ENCOUNTER — Other Ambulatory Visit: Payer: Self-pay

## 2020-11-15 ENCOUNTER — Other Ambulatory Visit: Payer: Self-pay | Admitting: Family Medicine

## 2020-11-15 DIAGNOSIS — E782 Mixed hyperlipidemia: Secondary | ICD-10-CM

## 2020-11-15 NOTE — Telephone Encounter (Signed)
Requested medication (s) are due for refill today -yes  Requested medication (s) are on the active medication list -yes  Future visit scheduled -no  Last refill: 08/06/20  Notes to clinic: Attempted to call patient to schedule appointment- left message to call office. Rx refused by office last request-needs appointment  Requested Prescriptions  Pending Prescriptions Disp Refills   atorvastatin (LIPITOR) 40 MG tablet [Pharmacy Med Name: Atorvastatin Calcium 40MG  TABS] 15 tablet     Sig: TAKE 1/2 TABLET BY MOUTH DAILY     Cardiovascular:  Antilipid - Statins Failed - 11/15/2020  2:24 PM      Failed - Total Cholesterol in normal range and within 360 days    Cholesterol, Total  Date Value Ref Range Status  04/22/2018 145 100 - 199 mg/dL Final   Cholesterol  Date Value Ref Range Status  10/05/2019 287 (H) 0 - 200 mg/dL Final          Failed - LDL in normal range and within 360 days    LDL Calculated  Date Value Ref Range Status  04/22/2018 62 0 - 99 mg/dL Final   LDL Cholesterol  Date Value Ref Range Status  10/05/2019 172 (H) 0 - 99 mg/dL Final    Comment:           Total Cholesterol/HDL:CHD Risk Coronary Heart Disease Risk Table                     Men   Women  1/2 Average Risk   3.4   3.3  Average Risk       5.0   4.4  2 X Average Risk   9.6   7.1  3 X Average Risk  23.4   11.0        Use the calculated Patient Ratio above and the CHD Risk Table to determine the patient's CHD Risk.        ATP III CLASSIFICATION (LDL):  <100     mg/dL   Optimal  10/07/2019  mg/dL   Near or Above                    Optimal  130-159  mg/dL   Borderline  158-309  mg/dL   High  407-680     mg/dL   Very High Performed at [facility], [address], [place], [place]           Failed - HDL in normal range and within 360 days    HDL  Date Value Ref Range Status  10/05/2019 86 >40 mg/dL Final  10/07/2019 64 94/58/5929 mg/dL Final          Failed - Triglycerides in normal  range and within 360 days    Triglycerides  Date Value Ref Range Status  10/05/2019 145 <150 mg/dL Final          Failed - Valid encounter within last 12 months    Recent Outpatient Visits           1 year ago Acquired hypothyroidism   [facility] [place], [place], NP   2 years ago Acquired hypothyroidism   [facility] [place], [place], [place]   3 years ago Acquired hypothyroidism   [facility] [place], [place], NP   3 years ago Upper respiratory tract infection, unspecified type   [facility] [place],  Oren Beckmann, FNP   3 years ago Well woman exam with routine gynecological exam   [facility] [place], [place], [place]              Passed - Patient is not pregnant         Requested Prescriptions  Pending Prescriptions Disp Refills   atorvastatin (LIPITOR) 40 MG tablet [Pharmacy Med Name: Atorvastatin Calcium 40MG  TABS] 15 tablet     Sig: TAKE 1/2 TABLET BY MOUTH DAILY     Cardiovascular:  Antilipid - Statins Failed - 11/15/2020  2:24 PM      Failed - Total Cholesterol in normal range and within 360 days    Cholesterol, Total  Date Value Ref Range Status  04/22/2018 145 100 - 199 mg/dL Final   Cholesterol  Date Value Ref Range Status  10/05/2019 287 (H) 0 - 200 mg/dL Final          Failed - LDL in normal range and within 360 days    LDL Calculated  Date Value Ref Range Status  04/22/2018 62 0 - 99 mg/dL Final   LDL Cholesterol  Date Value Ref Range Status  10/05/2019 172 (H) 0 - 99 mg/dL Final    Comment:           Total Cholesterol/HDL:CHD Risk Coronary Heart Disease Risk Table                     Men   Women  1/2 Average Risk   3.4   3.3  Average Risk       5.0   4.4  2 X Average Risk   9.6   7.1  3 X Average Risk  23.4   11.0        Use the calculated Patient Ratio above and the CHD Risk Table to  determine the patient's CHD Risk.        ATP III CLASSIFICATION (LDL):  <100     mg/dL   Optimal  10/07/2019  mg/dL   Near or Above                    Optimal  130-159  mg/dL   Borderline  595-638  mg/dL   High  756-433     mg/dL   Very High Performed at [facility], [address] [place], [place]           Failed - HDL in normal range and within 360 days    HDL  Date Value Ref Range Status  10/05/2019 86 >40 mg/dL Final  10/07/2019 64 66/08/3014 mg/dL Final          Failed - Triglycerides in normal range and within 360 days    Triglycerides  Date Value Ref Range Status  10/05/2019 145 <150 mg/dL Final          Failed - Valid encounter within last 12 months    Recent Outpatient Visits           1 year ago Acquired hypothyroidism   [place] [facility] [place], [place], NP   2 years ago Acquired hypothyroidism   [facility] [place], [place], [place]   3 years ago Acquired hypothyroidism   [facility] [place], [place], NP   3 years ago Upper respiratory tract infection, unspecified type   [facility] And Wellness  [place] Bark, FNP   3 years ago Well woman exam with routine gynecological exam   [facility] [place], [place], [place]              Passed - Patient is not pregnant

## 2020-11-20 ENCOUNTER — Other Ambulatory Visit: Payer: Medicare HMO

## 2020-12-07 ENCOUNTER — Other Ambulatory Visit: Payer: Self-pay | Admitting: Family Medicine

## 2020-12-07 ENCOUNTER — Other Ambulatory Visit: Payer: Self-pay

## 2020-12-07 DIAGNOSIS — E782 Mixed hyperlipidemia: Secondary | ICD-10-CM

## 2020-12-07 NOTE — Telephone Encounter (Signed)
Requested medications are due for refill today yes  Requested medications are on the active medication list yes  Last refill 08/06/20  Last visit 01/2019, last valid lab 09/2019  Future visit scheduled NO  Notes to clinic failed protocol of valid visit within one year and lab within 360 days. Please assess.

## 2020-12-07 NOTE — Telephone Encounter (Signed)
Requested medications are due for refill today yes  Requested medications are on the active medication list yes  Last refill 03/15/20  Last visit 01/2019, labs were 09/2019  Future visit scheduled no  Notes to clinic failed protocol of valid visit within 1 year, valid labs within 360 days.

## 2020-12-12 ENCOUNTER — Other Ambulatory Visit: Payer: Self-pay

## 2021-01-15 ENCOUNTER — Telehealth: Payer: Self-pay | Admitting: Nurse Practitioner

## 2021-01-15 NOTE — Telephone Encounter (Signed)
Copied from CRM [id number]. Topic: Quick Communication - Rx Refill/Question >> Jan 15, 2021 11:54 AM Marylen Ponto wrote: Medication: levothyroxine (SYNTHROID) 137 MCG tablet  Has the patient contacted their pharmacy? Yes.  Pt told to contact provider (Agent: If no, request that the patient contact the pharmacy for the refill. If patient does not wish to contact the pharmacy document the reason why and proceed with request.) (Agent: If yes, when and what did the pharmacy advise?)  Preferred Pharmacy (with phone number or street name): [id number] - [place], [place] - [address]   Phone: [phone]  Fax: [phone]  Has the patient been seen for an appointment in the last year OR does the patient have an upcoming appointment? No. Pt waiting on call back regarding lab order  Agent: Please be advised that RX refills may take up to 3 business days. We ask that you follow-up with your pharmacy.

## 2021-01-15 NOTE — Telephone Encounter (Signed)
Requested medication (s) are due for refill today:   Yes  Requested medication (s) are on the active medication list:   Yes  Future visit scheduled:   No  She has cancelled or No Showed her last several appts   Last ordered: 08/06/2020  Returned because she has been notified about needing an appt.   She has then cancelled or been a No Show for those appts.  Provider to review for refills.   Requested Prescriptions  Pending Prescriptions Disp Refills   levothyroxine (SYNTHROID) 137 MCG tablet 90 tablet 0    Sig: TAKE 1 TABLET (137 MCG TOTAL) BY MOUTH DAILY.     Endocrinology:  Hypothyroid Agents Failed - 01/15/2021 12:03 PM      Failed - TSH needs to be rechecked within 3 months after an abnormal result. Refill until TSH is due.      Failed - TSH in normal range and within 360 days    TSH  Date Value Ref Range Status  10/05/2019 15.200 (H) 0.350 - 4.500 uIU/mL Final    Comment:    Performed by a 3rd Generation assay with a functional sensitivity of <=0.01 uIU/mL. Performed at [facility], [address], [place], [place] [address]   01/31/2019 33.900 (H) 0.450 - 4.500 uIU/mL Final          Failed - Valid encounter within last 12 months    Recent Outpatient Visits           1 year ago Acquired hypothyroidism   [facility] [place], Shea Stakes, NP   2 years ago Acquired hypothyroidism   [facility] [place], [place], [place]   3 years ago Acquired hypothyroidism   [place] [address] [facility] [place], Shea Stakes, NP   3 years ago Upper respiratory tract infection, unspecified type   [facility] [place], Oren Beckmann, FNP   3 years ago Well woman exam with routine gynecological exam   [facility] [place], Oren Beckmann, [place]

## 2021-01-18 ENCOUNTER — Telehealth (INDEPENDENT_AMBULATORY_CARE_PROVIDER_SITE_OTHER): Payer: Self-pay

## 2021-01-18 NOTE — Telephone Encounter (Signed)
Patient called in to inquire of Bertram Denver if she can get a refill on her medication since she is having complications from not having this medication for about a month now. Say that she is lethargic, feeling nauseated, issues with eye sight and some issues walking. Please advise and call Ph# [phone]

## 2021-01-18 NOTE — Telephone Encounter (Signed)
Patient does not meet refill protocol  Has an apt on 01/21/2021

## 2021-01-18 NOTE — Telephone Encounter (Signed)
Patient has an appt on 11/7. Please discuss thyroid concerns with patient at that time. Rachel Everett    Copied from [employer] [id number]. Topic: General - Other >> Jan 14, 2021 12:05 PM Pawlus, [name] wrote: Reason for CRM: Pt wanted to know if lab orders could be placed to check her thyroid, pt stated she has been having issues, please advise if pt can come in to get labs done.

## 2021-01-21 ENCOUNTER — Other Ambulatory Visit: Payer: Self-pay

## 2021-01-21 ENCOUNTER — Ambulatory Visit: Payer: Medicare HMO | Attending: Nurse Practitioner | Admitting: Nurse Practitioner

## 2021-01-21 ENCOUNTER — Encounter: Payer: Self-pay | Admitting: Nurse Practitioner

## 2021-01-21 VITALS — BP 122/88 | HR 66 | Ht 66.0 in | Wt 208.1 lb

## 2021-01-21 DIAGNOSIS — Z1231 Encounter for screening mammogram for malignant neoplasm of breast: Secondary | ICD-10-CM

## 2021-01-21 DIAGNOSIS — D72819 Decreased white blood cell count, unspecified: Secondary | ICD-10-CM

## 2021-01-21 DIAGNOSIS — E039 Hypothyroidism, unspecified: Secondary | ICD-10-CM

## 2021-01-21 DIAGNOSIS — E785 Hyperlipidemia, unspecified: Secondary | ICD-10-CM

## 2021-01-21 DIAGNOSIS — Z79899 Other long term (current) drug therapy: Secondary | ICD-10-CM

## 2021-01-21 DIAGNOSIS — E559 Vitamin D deficiency, unspecified: Secondary | ICD-10-CM

## 2021-01-21 DIAGNOSIS — R7989 Other specified abnormal findings of blood chemistry: Secondary | ICD-10-CM | POA: Diagnosis not present

## 2021-01-21 MED ORDER — LEVOTHYROXINE SODIUM 137 MCG PO TABS
ORAL_TABLET | ORAL | 0 refills | Status: DC
  Filled 2021-01-21: qty 90, 90d supply, fill #0

## 2021-01-21 MED ORDER — ATORVASTATIN CALCIUM 20 MG PO TABS
20.0000 mg | ORAL_TABLET | Freq: Every day | ORAL | 3 refills | Status: DC
Start: 2021-01-21 — End: 2021-08-06
  Filled 2021-01-21: qty 90, 90d supply, fill #0

## 2021-01-21 NOTE — Progress Notes (Signed)
Assessment & Plan:  Rachel Everett was seen today for medication refill.  Diagnoses and all orders for this visit:  Acquired hypothyroidism -     Thyroid Panel With TSH -     CMP14+EGFR -     levothyroxine (SYNTHROID) 137 MCG tablet; TAKE 1 TABLET (137 MCG TOTAL) BY MOUTH DAILY.  Lithium use -     Lithium level  Leukopenia, unspecified type  Vitamin D deficiency -     VITAMIN D 25 Hydroxy (Vit-D Deficiency, Fractures)  Elevated serum creatinine -     CBC with Differential  Dyslipidemia, goal LDL below 100 -     Lipid panel -     atorvastatin (LIPITOR) 20 MG tablet; Take 1 tablet (20 mg total) by mouth daily.  Breast cancer screening by mammogram -     MS DIGITAL SCREENING TOMO BILATERAL; Future   Patient has been counseled on age-appropriate routine health concerns for screening and prevention. These are reviewed and up-to-date. Referrals have been placed accordingly. Immunizations are up-to-date or declined.    Subjective:   Chief Complaint  Patient presents with   Medication Refill    Rachel Everett 60 y.o. female presents to office today for for medication refill of levothyroxine  States "I've been going down hill over the past 6 weeks and I think it is because of my thyroid.".    Hypothyroidism She has been out of her thyroid medication for several months now.  She has a history of medication nonadherence.  Lab Results  Component Value Date   TSH 15.200 (H) 10/05/2019    She sees a psychiatrist monthly for bipolar disorder.  The therapist is requesting a lithium level and vitamin D level today.  Rachel Everett takes lithium carbonate 300 mg 3 times daily and Seroquel XR 50 mg daily.   Review of Systems  Constitutional:  Negative for fever, malaise/fatigue and weight loss.  HENT: Negative.  Negative for nosebleeds.   Eyes:  Positive for blurred vision. Negative for double vision and photophobia.  Respiratory: Negative.  Negative for cough and shortness of breath.    Cardiovascular: Negative.  Negative for chest pain, palpitations and leg swelling.  Gastrointestinal: Negative.  Negative for heartburn, nausea and vomiting.  Musculoskeletal:  Positive for falls. Negative for myalgias.  Neurological:  Positive for weakness (Using straight cane to assist with mobility). Negative for dizziness, focal weakness, seizures and headaches.  Psychiatric/Behavioral: Negative.  Negative for suicidal ideas.    Past Medical History:  Diagnosis Date   Anxiety    Bipolar 1 disorder (HCC)    Depression    GERD (gastroesophageal reflux disease)    Hyperlipidemia    SVD (spontaneous vaginal delivery)    x 1   Thyroid disease     Past Surgical History:  Procedure Laterality Date   ANKLE SURGERY     right    ANTERIOR CRUCIATE LIGAMENT REPAIR Left    BACK SURGERY     L5-S1   CHOLECYSTECTOMY     FRACTURE SURGERY Left    elbow   TENDON REPAIR Right    angle   TONSILLECTOMY      Family History  Problem Relation Age of Onset   Cancer Mother    Cancer Maternal Grandmother    Colon cancer Neg Hx    Esophageal cancer Neg Hx    Rectal cancer Neg Hx    Stomach cancer Neg Hx     Social History Reviewed with no changes to be made today.  Outpatient Medications Prior to Visit  Medication Sig Dispense Refill   lamoTRIgine (LAMICTAL) 200 MG tablet Take 200 mg by mouth 2 (two) times daily.     lithium carbonate (LITHOBID) 300 MG CR tablet Take 300 mg by mouth 3 (three) times daily.   0   propranolol (INDERAL) 10 MG tablet      QUEtiapine (SEROQUEL XR) 50 MG TB24 24 hr tablet Take 50 mg by mouth daily as needed.     levothyroxine (SYNTHROID) 137 MCG tablet TAKE 1 TABLET (137 MCG TOTAL) BY MOUTH DAILY. 90 tablet 0   omeprazole (PRILOSEC) 20 MG capsule Take 1 capsule (20 mg total) by mouth daily. 90 capsule 2   atorvastatin (LIPITOR) 40 MG tablet TAKE 0.5 TABLETS (20 MG TOTAL) BY MOUTH DAILY. 90 tablet 0   diclofenac Sodium (VOLTAREN) 1 % GEL Apply 2 g topically 4  (four) times daily. (Patient not taking: Reported on 01/21/2021) 100 g 0   oxyCODONE-acetaminophen (PERCOCET/ROXICET) 5-325 MG tablet Take 1 tablet by mouth every 4 (four) hours as needed for severe pain. 12 tablet 0   No facility-administered medications prior to visit.    No Known Allergies     Objective:    BP 122/88   Pulse 66   Ht '5\' 6"'  (1.676 m)   Wt 208 lb 2 oz (94.4 kg)   SpO2 99%   BMI 33.59 kg/m  Wt Readings from Last 3 Encounters:  01/21/21 208 lb 2 oz (94.4 kg)  10/04/19 175 lb (79.4 kg)  07/11/18 160 lb (72.6 kg)    Physical Exam Vitals and nursing note reviewed.  Constitutional:      Appearance: She is well-developed.     Comments: Poorly groomed  HENT:     Head: Normocephalic and atraumatic.  Cardiovascular:     Rate and Rhythm: Normal rate and regular rhythm.     Heart sounds: Normal heart sounds. No murmur heard.   No friction rub. No gallop.  Pulmonary:     Effort: Pulmonary effort is normal. No tachypnea or respiratory distress.     Breath sounds: Normal breath sounds. No decreased breath sounds, wheezing, rhonchi or rales.  Chest:     Chest wall: No tenderness.  Abdominal:     General: Bowel sounds are normal.     Palpations: Abdomen is soft.  Musculoskeletal:        General: Normal range of motion.     Cervical back: Normal range of motion.  Skin:    General: Skin is warm and dry.  Neurological:     Mental Status: She is alert and oriented to person, place, and time.     Coordination: Coordination normal.  Psychiatric:        Behavior: Behavior normal. Behavior is cooperative.        Thought Content: Thought content normal.        Judgment: Judgment normal.         Patient has been counseled extensively about nutrition and exercise as well as the importance of adherence with medications and regular follow-up. The patient was given clear instructions to go to ER or return to medical center if symptoms don't improve, worsen or new problems  develop. The patient verbalized understanding.   Follow-up: Return for 4 weeks labs TSH. see me in 3 months pap smear.   Gildardo Pounds, FNP-BC [facility] [place], [place]   01/21/2021, 12:16 PM

## 2021-01-22 LAB — LIPID PANEL
Chol/HDL Ratio: 5.3 ratio — ABNORMAL HIGH (ref 0.0–4.4)
Cholesterol, Total: 448 mg/dL — ABNORMAL HIGH (ref 100–199)
HDL: 85 mg/dL (ref >39)
LDL Chol Calc (NIH): 320 mg/dL — ABNORMAL HIGH (ref 0–99)
Triglycerides: 204 mg/dL — ABNORMAL HIGH (ref 0–149)
VLDL Cholesterol Cal: 43 mg/dL — ABNORMAL HIGH (ref 5–40)

## 2021-01-22 LAB — CBC WITH DIFFERENTIAL/PLATELET
Basophils Absolute: 0.1 10*3/uL (ref 0.0–0.2)
Basos: 1 %
EOS (ABSOLUTE): 0.4 10*3/uL (ref 0.0–0.4)
Eos: 4 %
Hematocrit: 40.1 % (ref 34.0–46.6)
Hemoglobin: 13.1 g/dL (ref 11.1–15.9)
Immature Grans (Abs): 0.1 10*3/uL (ref 0.0–0.1)
Immature Granulocytes: 1 %
Lymphocytes Absolute: 3 10*3/uL (ref 0.7–3.1)
Lymphs: 30 %
MCH: 29 pg (ref 26.6–33.0)
MCHC: 32.7 g/dL (ref 31.5–35.7)
MCV: 89 fL (ref 79–97)
Monocytes Absolute: 0.5 10*3/uL (ref 0.1–0.9)
Monocytes: 5 %
Neutrophils Absolute: 5.8 10*3/uL (ref 1.4–7.0)
Neutrophils: 59 %
Platelets: 298 10*3/uL (ref 150–450)
RBC: 4.51 x10E6/uL (ref 3.77–5.28)
RDW: 14.2 % (ref 11.7–15.4)
WBC: 9.9 10*3/uL (ref 3.4–10.8)

## 2021-01-22 LAB — CMP14+EGFR
ALT: 14 IU/L (ref 0–32)
AST: 20 IU/L (ref 0–40)
Albumin/Globulin Ratio: 2 (ref 1.2–2.2)
Albumin: 4.5 g/dL (ref 3.8–4.8)
Alkaline Phosphatase: 115 IU/L (ref 44–121)
BUN/Creatinine Ratio: 10 — ABNORMAL LOW (ref 12–28)
BUN: 14 mg/dL (ref 8–27)
Bilirubin Total: 0.2 mg/dL (ref 0.0–1.2)
CO2: 20 mmol/L (ref 20–29)
Calcium: 9.9 mg/dL (ref 8.7–10.3)
Chloride: 102 mmol/L (ref 96–106)
Creatinine, Ser: 1.46 mg/dL — ABNORMAL HIGH (ref 0.57–1.00)
Globulin, Total: 2.3 g/dL (ref 1.5–4.5)
Glucose: 96 mg/dL (ref 70–99)
Potassium: 4.4 mmol/L (ref 3.5–5.2)
Sodium: 140 mmol/L (ref 134–144)
Total Protein: 6.8 g/dL (ref 6.0–8.5)
eGFR: 41 mL/min/{1.73_m2} — ABNORMAL LOW (ref >59)

## 2021-01-22 LAB — THYROID PANEL WITH TSH
T3 Uptake Ratio: 12 % — ABNORMAL LOW (ref 24–39)
T4, Total: <0.4 ug/dL — CL (ref 4.5–12.0)
TSH: 87.2 u[IU]/mL — ABNORMAL HIGH (ref 0.450–4.500)

## 2021-01-22 LAB — VITAMIN D 25 HYDROXY (VIT D DEFICIENCY, FRACTURES): Vit D, 25-Hydroxy: 9 ng/mL — ABNORMAL LOW (ref 30.0–100.0)

## 2021-01-22 LAB — LITHIUM LEVEL: Lithium Lvl: 1.1 mmol/L (ref 0.5–1.2)

## 2021-01-23 ENCOUNTER — Other Ambulatory Visit: Payer: Self-pay | Admitting: Nurse Practitioner

## 2021-01-23 ENCOUNTER — Other Ambulatory Visit: Payer: Self-pay

## 2021-01-23 DIAGNOSIS — R7989 Other specified abnormal findings of blood chemistry: Secondary | ICD-10-CM

## 2021-01-23 MED ORDER — VITAMIN D (ERGOCALCIFEROL) 1.25 MG (50000 UNIT) PO CAPS
50000.0000 [IU] | ORAL_CAPSULE | ORAL | 1 refills | Status: DC
Start: 2021-01-23 — End: 2022-12-20
  Filled 2021-01-23: qty 4, 28d supply, fill #0

## 2021-01-30 ENCOUNTER — Other Ambulatory Visit: Payer: Self-pay

## 2021-02-18 ENCOUNTER — Other Ambulatory Visit: Payer: Medicare HMO

## 2021-02-18 DIAGNOSIS — F3132 Bipolar disorder, current episode depressed, moderate: Secondary | ICD-10-CM | POA: Diagnosis not present

## 2021-02-21 ENCOUNTER — Other Ambulatory Visit: Payer: Self-pay

## 2021-02-21 ENCOUNTER — Other Ambulatory Visit: Payer: Self-pay | Admitting: Nurse Practitioner

## 2021-02-21 DIAGNOSIS — K219 Gastro-esophageal reflux disease without esophagitis: Secondary | ICD-10-CM

## 2021-02-21 MED ORDER — OMEPRAZOLE 20 MG PO CPDR
20.0000 mg | DELAYED_RELEASE_CAPSULE | Freq: Every day | ORAL | 2 refills | Status: DC
  Filled 2021-02-21 – 2021-07-19 (×2): qty 30, 30d supply, fill #0

## 2021-02-22 ENCOUNTER — Other Ambulatory Visit: Payer: Self-pay

## 2021-03-01 ENCOUNTER — Other Ambulatory Visit: Payer: Self-pay

## 2021-03-25 ENCOUNTER — Telehealth: Payer: Self-pay

## 2021-03-25 NOTE — Telephone Encounter (Signed)
Contacted pt to schedule Medicare Wellness pt didn't answer lvm   °

## 2021-04-19 ENCOUNTER — Other Ambulatory Visit: Payer: Self-pay | Admitting: Nurse Practitioner

## 2021-04-19 DIAGNOSIS — E039 Hypothyroidism, unspecified: Secondary | ICD-10-CM

## 2021-04-22 ENCOUNTER — Other Ambulatory Visit: Payer: Self-pay | Admitting: Nurse Practitioner

## 2021-04-22 DIAGNOSIS — R7989 Other specified abnormal findings of blood chemistry: Secondary | ICD-10-CM

## 2021-04-26 ENCOUNTER — Ambulatory Visit: Payer: Medicare HMO | Admitting: Nurse Practitioner

## 2021-05-20 DIAGNOSIS — F3132 Bipolar disorder, current episode depressed, moderate: Secondary | ICD-10-CM | POA: Diagnosis not present

## 2021-06-12 ENCOUNTER — Other Ambulatory Visit (HOSPITAL_BASED_OUTPATIENT_CLINIC_OR_DEPARTMENT_OTHER): Payer: Self-pay

## 2021-07-05 ENCOUNTER — Other Ambulatory Visit: Payer: Self-pay | Admitting: Nurse Practitioner

## 2021-07-05 DIAGNOSIS — K219 Gastro-esophageal reflux disease without esophagitis: Secondary | ICD-10-CM

## 2021-07-05 NOTE — Telephone Encounter (Signed)
Requested medication (s) are due for refill today: yes ? ?Requested medication (s) are on the active medication list: yes ? ?Last refill:  02/21/21 #30 with 2 RF ? ?Future visit scheduled: no, pt was asked to have labs drawn, then return in 3 months, she did not have labs drawn and canceled her 3 month appt. ? ?Notes to clinic:  please assess. ? ? ?  ? ?Requested Prescriptions  ?Pending Prescriptions Disp Refills  ? omeprazole (PRILOSEC) 20 MG capsule [Pharmacy Med Name: Omeprazole 20MG  CPDR] 30 capsule 2  ?  Sig: TAKE 1 CAPSULE BY MOUTH DAILY  ?  ? Gastroenterology: Proton Pump Inhibitors Passed - 07/05/2021 11:57 AM  ?  ?  Passed - Valid encounter within last 12 months  ?  Recent Outpatient Visits   ? ?      ? 5 months ago Acquired hypothyroidism  ? [facility] [place], [place], NP  ? 2 years ago Acquired hypothyroidism  ? [facility] [place], [place], [place]  ? 3 years ago Acquired hypothyroidism  ? [facility] [place], [place], [place]  ? 3 years ago Acquired hypothyroidism  ? [facility] [place], [place], NP  ? 4 years ago Upper respiratory tract infection, unspecified type  ? [facility] [place], [place], [place]  ? ?  ?  ? ? ?  ?  ?  ? ? ?

## 2021-07-18 DIAGNOSIS — F3132 Bipolar disorder, current episode depressed, moderate: Secondary | ICD-10-CM | POA: Diagnosis not present

## 2021-07-19 ENCOUNTER — Other Ambulatory Visit: Payer: Self-pay

## 2021-07-19 ENCOUNTER — Telehealth: Payer: Self-pay | Admitting: Nurse Practitioner

## 2021-07-19 DIAGNOSIS — E039 Hypothyroidism, unspecified: Secondary | ICD-10-CM

## 2021-07-19 NOTE — Telephone Encounter (Signed)
Requested medication (s) are due for refill today: yes ? ?Requested medication (s) are on the active medication list: yes   ? ?Last refill: 01/21/21 #90 0 refills ? ?Future visit scheduled no ? ?Notes to clinic:  Pt was to return for F/U  labs, did not. Failed due to labs, please review. Thank you. ? ?Requested Prescriptions  ?Pending Prescriptions Disp Refills  ? levothyroxine (SYNTHROID) 137 MCG tablet 90 tablet 0  ?  Sig: TAKE 1 TABLET (137 MCG TOTAL) BY MOUTH DAILY.  ?  ? Endocrinology:  Hypothyroid Agents Failed - 07/19/2021  9:23 AM  ?  ?  Failed - TSH in normal range and within 360 days  ?  TSH  ?Date Value Ref Range Status  ?01/21/2021 87.200 (H) 0.450 - 4.500 uIU/mL Final  ?  ?  ?  ?  Passed - Valid encounter within last 12 months  ?  Recent Outpatient Visits   ? ?      ? 5 months ago Acquired hypothyroidism  ? [facility] [place], [place], NP  ? 2 years ago Acquired hypothyroidism  ? [facility] [place], [place], NP  ? 3 years ago Acquired hypothyroidism  ? [facility] [place], [place], [place]  ? 3 years ago Acquired hypothyroidism  ? [facility] [place], [place] W, NP  ? 4 years ago Upper respiratory tract infection, unspecified type  ? [facility] [place], [place], [place]  ? ?  ?  ? ? ?  ?  ?  ? ? ? ? ?

## 2021-07-25 ENCOUNTER — Other Ambulatory Visit: Payer: Self-pay | Admitting: Nurse Practitioner

## 2021-07-25 ENCOUNTER — Other Ambulatory Visit: Payer: Self-pay

## 2021-07-25 DIAGNOSIS — E039 Hypothyroidism, unspecified: Secondary | ICD-10-CM

## 2021-07-25 NOTE — Telephone Encounter (Signed)
Requested medication (s) are due for refill today:   Yes ? ?Requested medication (s) are on the active medication list:   Yes ? ?Future visit scheduled:   No.   Noted in chart where [facility]W has tried to contact her without an answer today 5/11 ? ? ?Last ordered: 01/21/2021 #90, 0 refills ? ?Returned because labs are due so protocol failed.  ? ?Requested Prescriptions  ?Pending Prescriptions Disp Refills  ? levothyroxine (SYNTHROID) 137 MCG tablet 90 tablet 0  ?  Sig: TAKE 1 TABLET (137 MCG TOTAL) BY MOUTH DAILY.  ?  ? Endocrinology:  Hypothyroid Agents Failed - 07/25/2021 11:56 AM  ?  ?  Failed - TSH in normal range and within 360 days  ?  TSH  ?Date Value Ref Range Status  ?01/21/2021 87.200 (H) 0.450 - 4.500 uIU/mL Final  ?   ?  ?  Passed - Valid encounter within last 12 months  ?  Recent Outpatient Visits   ? ?      ? 6 months ago Acquired hypothyroidism  ? [facility] [place], [place] W, NP  ? 2 years ago Acquired hypothyroidism  ? [facility] [place], [place] W, NP  ? 3 years ago Acquired hypothyroidism  ? [facility] [place], [place], [place]  ? 3 years ago Acquired hypothyroidism  ? [facility] [place], [place] W, NP  ? 4 years ago Upper respiratory tract infection, unspecified type  ? [facility] [place], [place], [place]  ? ?  ?  ? ? ?  ?  ?  ? ?

## 2021-07-25 NOTE — Telephone Encounter (Signed)
Contacted pt to schedule a lab appt pt didn't answer lvm  

## 2021-07-25 NOTE — Telephone Encounter (Signed)
Pt is calling back requesting an update on medication refill.  ?Pt  stated she is completely out of the medication.  ?

## 2021-07-31 ENCOUNTER — Other Ambulatory Visit: Payer: Self-pay

## 2021-08-06 ENCOUNTER — Other Ambulatory Visit: Payer: Self-pay

## 2021-08-06 ENCOUNTER — Telehealth: Payer: Self-pay | Admitting: Nurse Practitioner

## 2021-08-06 ENCOUNTER — Other Ambulatory Visit: Payer: Self-pay | Admitting: Pharmacist

## 2021-08-06 DIAGNOSIS — K219 Gastro-esophageal reflux disease without esophagitis: Secondary | ICD-10-CM

## 2021-08-06 DIAGNOSIS — E785 Hyperlipidemia, unspecified: Secondary | ICD-10-CM

## 2021-08-06 MED ORDER — ATORVASTATIN CALCIUM 20 MG PO TABS: 20.0000 mg | ORAL_TABLET | Freq: Every day | ORAL | 0 refills | Status: DC

## 2021-08-06 MED ORDER — OMEPRAZOLE 20 MG PO CPDR: 20.0000 mg | DELAYED_RELEASE_CAPSULE | Freq: Every day | ORAL | 0 refills | Status: DC

## 2021-08-06 NOTE — Telephone Encounter (Signed)
Copied from CRM [id number]. Topic: General - Other >> Aug 06, 2021  4:41 PM Rachel Everett A wrote: Reason for CRM: The patient has called to request lab orders to check their lithium, vitamin d and thyroid   Please contact further if needed

## 2021-08-07 ENCOUNTER — Other Ambulatory Visit: Payer: Self-pay | Admitting: Nurse Practitioner

## 2021-08-07 DIAGNOSIS — D72819 Decreased white blood cell count, unspecified: Secondary | ICD-10-CM

## 2021-08-07 DIAGNOSIS — Z1211 Encounter for screening for malignant neoplasm of colon: Secondary | ICD-10-CM

## 2021-08-07 DIAGNOSIS — E559 Vitamin D deficiency, unspecified: Secondary | ICD-10-CM

## 2021-08-07 DIAGNOSIS — F3162 Bipolar disorder, current episode mixed, moderate: Secondary | ICD-10-CM

## 2021-08-07 DIAGNOSIS — E039 Hypothyroidism, unspecified: Secondary | ICD-10-CM

## 2021-08-07 DIAGNOSIS — Z79899 Other long term (current) drug therapy: Secondary | ICD-10-CM

## 2021-08-07 DIAGNOSIS — E785 Hyperlipidemia, unspecified: Secondary | ICD-10-CM

## 2021-08-07 NOTE — Telephone Encounter (Signed)
PEC scheduled patient for lab appointment for 5/26/203, can patient have these labs drawn on that day.

## 2021-08-08 ENCOUNTER — Other Ambulatory Visit: Payer: Self-pay

## 2021-08-08 NOTE — Telephone Encounter (Signed)
Pt is calling regarding this Rx refill request, please advise if it can be filled.

## 2021-08-08 NOTE — Telephone Encounter (Signed)
It looks like patient needs updated labwork. Pending and routing levothyroxine order to pt's PCP.

## 2021-08-08 NOTE — Addendum Note (Signed)
Addended by: Yehuda Savannah L on: 08/08/2021 03:36 PM   Modules accepted: Orders

## 2021-08-09 ENCOUNTER — Other Ambulatory Visit: Payer: Medicare HMO

## 2021-08-19 ENCOUNTER — Other Ambulatory Visit: Payer: Medicare HMO

## 2021-09-06 ENCOUNTER — Other Ambulatory Visit: Payer: Medicare HMO

## 2021-09-16 ENCOUNTER — Other Ambulatory Visit: Payer: Medicare HMO

## 2021-10-15 DIAGNOSIS — E872 Acidosis, unspecified: Secondary | ICD-10-CM | POA: Diagnosis not present

## 2021-10-15 DIAGNOSIS — F419 Anxiety disorder, unspecified: Secondary | ICD-10-CM | POA: Diagnosis not present

## 2021-10-15 DIAGNOSIS — F4000 Agoraphobia, unspecified: Secondary | ICD-10-CM | POA: Diagnosis not present

## 2021-10-15 DIAGNOSIS — F845 Asperger's syndrome: Secondary | ICD-10-CM | POA: Diagnosis not present

## 2021-10-15 DIAGNOSIS — R55 Syncope and collapse: Secondary | ICD-10-CM | POA: Diagnosis not present

## 2021-10-15 DIAGNOSIS — E785 Hyperlipidemia, unspecified: Secondary | ICD-10-CM | POA: Diagnosis not present

## 2021-10-15 DIAGNOSIS — E039 Hypothyroidism, unspecified: Secondary | ICD-10-CM | POA: Diagnosis not present

## 2021-10-15 DIAGNOSIS — R0789 Other chest pain: Secondary | ICD-10-CM | POA: Diagnosis not present

## 2021-10-15 DIAGNOSIS — G8929 Other chronic pain: Secondary | ICD-10-CM | POA: Diagnosis not present

## 2021-10-15 DIAGNOSIS — F319 Bipolar disorder, unspecified: Secondary | ICD-10-CM | POA: Diagnosis not present

## 2021-10-15 DIAGNOSIS — F429 Obsessive-compulsive disorder, unspecified: Secondary | ICD-10-CM | POA: Diagnosis not present

## 2021-10-15 DIAGNOSIS — K219 Gastro-esophageal reflux disease without esophagitis: Secondary | ICD-10-CM | POA: Diagnosis not present

## 2021-10-16 DIAGNOSIS — R072 Precordial pain: Secondary | ICD-10-CM | POA: Diagnosis not present

## 2021-10-16 DIAGNOSIS — K219 Gastro-esophageal reflux disease without esophagitis: Secondary | ICD-10-CM | POA: Diagnosis not present

## 2021-10-16 DIAGNOSIS — E872 Acidosis, unspecified: Secondary | ICD-10-CM | POA: Diagnosis not present

## 2021-10-16 DIAGNOSIS — I10 Essential (primary) hypertension: Secondary | ICD-10-CM | POA: Diagnosis not present

## 2021-10-16 DIAGNOSIS — E785 Hyperlipidemia, unspecified: Secondary | ICD-10-CM | POA: Diagnosis not present

## 2021-10-16 DIAGNOSIS — R55 Syncope and collapse: Secondary | ICD-10-CM | POA: Diagnosis not present

## 2021-10-16 DIAGNOSIS — F429 Obsessive-compulsive disorder, unspecified: Secondary | ICD-10-CM | POA: Diagnosis not present

## 2021-10-16 DIAGNOSIS — F319 Bipolar disorder, unspecified: Secondary | ICD-10-CM | POA: Diagnosis not present

## 2021-10-16 DIAGNOSIS — E039 Hypothyroidism, unspecified: Secondary | ICD-10-CM | POA: Diagnosis not present

## 2021-10-16 DIAGNOSIS — F3181 Bipolar II disorder: Secondary | ICD-10-CM | POA: Diagnosis not present

## 2021-10-16 DIAGNOSIS — F4000 Agoraphobia, unspecified: Secondary | ICD-10-CM | POA: Diagnosis not present

## 2021-10-16 DIAGNOSIS — F845 Asperger's syndrome: Secondary | ICD-10-CM | POA: Diagnosis not present

## 2021-10-17 ENCOUNTER — Other Ambulatory Visit: Payer: Self-pay

## 2021-10-17 DIAGNOSIS — F845 Asperger's syndrome: Secondary | ICD-10-CM | POA: Diagnosis not present

## 2021-10-17 DIAGNOSIS — I10 Essential (primary) hypertension: Secondary | ICD-10-CM | POA: Diagnosis not present

## 2021-10-17 DIAGNOSIS — F429 Obsessive-compulsive disorder, unspecified: Secondary | ICD-10-CM | POA: Diagnosis not present

## 2021-10-17 DIAGNOSIS — R55 Syncope and collapse: Secondary | ICD-10-CM | POA: Diagnosis not present

## 2021-10-17 DIAGNOSIS — E785 Hyperlipidemia, unspecified: Secondary | ICD-10-CM | POA: Diagnosis not present

## 2021-10-17 DIAGNOSIS — E872 Acidosis, unspecified: Secondary | ICD-10-CM | POA: Diagnosis not present

## 2021-10-17 DIAGNOSIS — F3181 Bipolar II disorder: Secondary | ICD-10-CM | POA: Diagnosis not present

## 2021-10-17 DIAGNOSIS — R072 Precordial pain: Secondary | ICD-10-CM | POA: Diagnosis not present

## 2021-10-17 DIAGNOSIS — F319 Bipolar disorder, unspecified: Secondary | ICD-10-CM | POA: Diagnosis not present

## 2021-10-17 DIAGNOSIS — F4000 Agoraphobia, unspecified: Secondary | ICD-10-CM | POA: Diagnosis not present

## 2021-10-17 DIAGNOSIS — E039 Hypothyroidism, unspecified: Secondary | ICD-10-CM | POA: Diagnosis not present

## 2021-10-17 DIAGNOSIS — K219 Gastro-esophageal reflux disease without esophagitis: Secondary | ICD-10-CM | POA: Diagnosis not present

## 2021-10-31 ENCOUNTER — Telehealth: Payer: Self-pay | Admitting: *Deleted

## 2021-10-31 NOTE — Telephone Encounter (Signed)
Left message on voicemail to return call.  - schedule apt for AWV

## 2022-01-23 DIAGNOSIS — F3132 Bipolar disorder, current episode depressed, moderate: Secondary | ICD-10-CM | POA: Diagnosis not present

## 2022-06-20 ENCOUNTER — Ambulatory Visit: Payer: Self-pay | Admitting: *Deleted

## 2022-06-20 NOTE — Telephone Encounter (Signed)
Noted  

## 2022-06-20 NOTE — Telephone Encounter (Signed)
Nausea- over 1 year- not taking medication- can not get enough food in Reason for Disposition  SEVERE dizziness (e.g., unable to stand, requires support to walk, feels like passing out now)  Answer Assessment - Initial Assessment Questions 1. NAUSEA SEVERITY: "How bad is the nausea?" (e.g., mild, moderate, severe; dehydration, weight loss)   - MILD: loss of appetite without change in eating habits   - MODERATE: decreased oral intake without significant weight loss, dehydration, or malnutrition   - SEVERE: inadequate caloric or fluid intake, significant weight loss, symptoms of dehydration     moderate 2. ONSET: "When did the nausea begin?"     Over 1 year 3. VOMITING: "Any vomiting?" If Yes, ask: "How many times today?"     Yes- 2-3 times/week 4. RECURRENT SYMPTOM: "Have you had nausea before?" If Yes, ask: "When was the last time?" "What happened that time?"     Chronic- 1 year 5. CAUSE: "What do you think is causing the nausea?"     Unsure- acid reflux hx  Answer Assessment - Initial Assessment Questions 1. DESCRIPTION: "Describe your dizziness."     Out of synthroid- fatigue, weakness, lightheaded 2. LIGHTHEADED: "Do you feel lightheaded?" (e.g., somewhat faint, woozy, weak upon standing)     Yes- weakness  4. SEVERITY: "How bad is it?"  "Do you feel like you are going to faint?" "Can you stand and walk?"   - MILD: Feels slightly dizzy, but walking normally.   - MODERATE: Feels unsteady when walking, but not falling; interferes with normal activities (e.g., school, work).   - SEVERE: Unable to walk without falling, or requires assistance to walk without falling; feels like passing out now.      Moderate/severe 5. ONSET:  "When did the dizziness begin?"     Past week 6. AGGRAVATING FACTORS: "Does anything make it worse?" (e.g., standing, change in head position)     When standing 7. HEART RATE: "Can you tell me your heart rate?" "How many beats in 15 seconds?"  (Note: not all  patients can do this)       N/A 8. CAUSE: "What do you think is causing the dizziness?"     Unsure- not eating 9. RECURRENT SYMPTOM: "Have you had dizziness before?" If Yes, ask: "When was the last time?" "What happened that time?"     no 10. OTHER SYMPTOMS: "Do you have any other symptoms?" (e.g., fever, chest pain, vomiting, diarrhea, bleeding)       Diarrhea, vomiting  Protocols used: Dizziness - Lightheadedness-A-AH, Nausea-A-AH

## 2022-06-20 NOTE — Telephone Encounter (Signed)
  Chief Complaint: fatigue, nausea, vomiting, dizziness, hip pain Symptoms: see above Frequency: chronic Pertinent Negatives: Patient denies fever, chest pain, bleeding Disposition: [x] ED /[] Urgent Care (no appt availability in office) / [] Appointment(In office/virtual)/ []  [place] [facility]/ [] Home Care/ [] Refused Recommended Disposition /[] [place] [facility]/ []  Follow-up with PCP Additional Notes: Patient describes weakness- she needs help to walk, not able to eat due to nausea, diarrhea- due to multiple high risk symptoms- patient has been advised ED

## 2022-12-20 ENCOUNTER — Emergency Department (HOSPITAL_COMMUNITY): Payer: Medicare HMO

## 2022-12-20 ENCOUNTER — Other Ambulatory Visit: Payer: Self-pay

## 2022-12-20 ENCOUNTER — Encounter (HOSPITAL_COMMUNITY): Payer: Self-pay

## 2022-12-20 ENCOUNTER — Inpatient Hospital Stay (HOSPITAL_COMMUNITY): Payer: Medicare HMO

## 2022-12-20 ENCOUNTER — Inpatient Hospital Stay (HOSPITAL_COMMUNITY)
Admission: EM | Admit: 2022-12-20 | Discharge: 2022-12-27 | DRG: 080 | Disposition: A | Discharge status: Home / Self Care | Payer: Medicare HMO | Attending: Family Medicine | Admitting: Family Medicine

## 2022-12-20 DIAGNOSIS — Z91148 Patient's other noncompliance with medication regimen for other reason: Secondary | ICD-10-CM

## 2022-12-20 DIAGNOSIS — R935 Abnormal findings on diagnostic imaging of other abdominal regions, including retroperitoneum: Secondary | ICD-10-CM | POA: Diagnosis not present

## 2022-12-20 DIAGNOSIS — I10 Essential (primary) hypertension: Secondary | ICD-10-CM | POA: Diagnosis not present

## 2022-12-20 DIAGNOSIS — E876 Hypokalemia: Secondary | ICD-10-CM | POA: Diagnosis not present

## 2022-12-20 DIAGNOSIS — K219 Gastro-esophageal reflux disease without esophagitis: Secondary | ICD-10-CM | POA: Diagnosis present

## 2022-12-20 DIAGNOSIS — N179 Acute kidney failure, unspecified: Secondary | ICD-10-CM | POA: Diagnosis present

## 2022-12-20 DIAGNOSIS — F319 Bipolar disorder, unspecified: Secondary | ICD-10-CM | POA: Diagnosis present

## 2022-12-20 DIAGNOSIS — F4000 Agoraphobia, unspecified: Secondary | ICD-10-CM | POA: Diagnosis present

## 2022-12-20 DIAGNOSIS — Z87891 Personal history of nicotine dependence: Secondary | ICD-10-CM

## 2022-12-20 DIAGNOSIS — Z79899 Other long term (current) drug therapy: Secondary | ICD-10-CM

## 2022-12-20 DIAGNOSIS — R001 Bradycardia, unspecified: Secondary | ICD-10-CM | POA: Diagnosis present

## 2022-12-20 DIAGNOSIS — F4310 Post-traumatic stress disorder, unspecified: Secondary | ICD-10-CM | POA: Diagnosis present

## 2022-12-20 DIAGNOSIS — I442 Atrioventricular block, complete: Secondary | ICD-10-CM | POA: Diagnosis present

## 2022-12-20 DIAGNOSIS — K760 Fatty (change of) liver, not elsewhere classified: Secondary | ICD-10-CM | POA: Diagnosis not present

## 2022-12-20 DIAGNOSIS — E079 Disorder of thyroid, unspecified: Secondary | ICD-10-CM | POA: Diagnosis present

## 2022-12-20 DIAGNOSIS — N390 Urinary tract infection, site not specified: Secondary | ICD-10-CM | POA: Diagnosis not present

## 2022-12-20 DIAGNOSIS — I700 Atherosclerosis of aorta: Secondary | ICD-10-CM | POA: Diagnosis not present

## 2022-12-20 DIAGNOSIS — R6521 Severe sepsis with septic shock: Secondary | ICD-10-CM

## 2022-12-20 DIAGNOSIS — R680 Hypothermia, not associated with low environmental temperature: Secondary | ICD-10-CM | POA: Diagnosis present

## 2022-12-20 DIAGNOSIS — R109 Unspecified abdominal pain: Secondary | ICD-10-CM | POA: Diagnosis not present

## 2022-12-20 DIAGNOSIS — E872 Acidosis, unspecified: Secondary | ICD-10-CM | POA: Diagnosis present

## 2022-12-20 DIAGNOSIS — E785 Hyperlipidemia, unspecified: Secondary | ICD-10-CM | POA: Diagnosis present

## 2022-12-20 DIAGNOSIS — R54 Age-related physical debility: Secondary | ICD-10-CM | POA: Diagnosis present

## 2022-12-20 DIAGNOSIS — R231 Pallor: Secondary | ICD-10-CM | POA: Diagnosis not present

## 2022-12-20 DIAGNOSIS — G9341 Metabolic encephalopathy: Secondary | ICD-10-CM | POA: Diagnosis present

## 2022-12-20 DIAGNOSIS — Z79890 Hormone replacement therapy (postmenopausal): Secondary | ICD-10-CM | POA: Diagnosis not present

## 2022-12-20 DIAGNOSIS — R0609 Other forms of dyspnea: Secondary | ICD-10-CM | POA: Diagnosis not present

## 2022-12-20 DIAGNOSIS — E035 Myxedema coma: Secondary | ICD-10-CM | POA: Diagnosis not present

## 2022-12-20 DIAGNOSIS — K449 Diaphragmatic hernia without obstruction or gangrene: Secondary | ICD-10-CM | POA: Diagnosis not present

## 2022-12-20 DIAGNOSIS — A419 Sepsis, unspecified organism: Secondary | ICD-10-CM

## 2022-12-20 DIAGNOSIS — F419 Anxiety disorder, unspecified: Secondary | ICD-10-CM | POA: Diagnosis present

## 2022-12-20 DIAGNOSIS — R570 Cardiogenic shock: Principal | ICD-10-CM | POA: Diagnosis present

## 2022-12-20 DIAGNOSIS — I959 Hypotension, unspecified: Secondary | ICD-10-CM | POA: Diagnosis not present

## 2022-12-20 DIAGNOSIS — R918 Other nonspecific abnormal finding of lung field: Secondary | ICD-10-CM | POA: Diagnosis not present

## 2022-12-20 DIAGNOSIS — G4700 Insomnia, unspecified: Secondary | ICD-10-CM | POA: Diagnosis present

## 2022-12-20 DIAGNOSIS — R140 Abdominal distension (gaseous): Secondary | ICD-10-CM | POA: Diagnosis not present

## 2022-12-20 DIAGNOSIS — R110 Nausea: Secondary | ICD-10-CM | POA: Diagnosis not present

## 2022-12-20 DIAGNOSIS — R112 Nausea with vomiting, unspecified: Secondary | ICD-10-CM | POA: Diagnosis not present

## 2022-12-20 DIAGNOSIS — E039 Hypothyroidism, unspecified: Secondary | ICD-10-CM | POA: Diagnosis not present

## 2022-12-20 DIAGNOSIS — Z9049 Acquired absence of other specified parts of digestive tract: Secondary | ICD-10-CM | POA: Diagnosis not present

## 2022-12-20 LAB — COMPREHENSIVE METABOLIC PANEL
ALT: 21 U/L (ref 0–44)
AST: 24 U/L (ref 15–41)
Albumin: 2.8 g/dL — ABNORMAL LOW (ref 3.5–5.0)
Alkaline Phosphatase: 60 U/L (ref 38–126)
Anion gap: 10 (ref 5–15)
BUN: 9 mg/dL (ref 8–23)
CO2: 18 mmol/L — ABNORMAL LOW (ref 22–32)
Calcium: 7.3 mg/dL — ABNORMAL LOW (ref 8.9–10.3)
Chloride: 111 mmol/L (ref 98–111)
Creatinine, Ser: 1.14 mg/dL — ABNORMAL HIGH (ref 0.44–1.00)
GFR, Estimated: 54 mL/min — ABNORMAL LOW (ref >60)
Glucose, Bld: 121 mg/dL — ABNORMAL HIGH (ref 70–99)
Potassium: 3 mmol/L — ABNORMAL LOW (ref 3.5–5.1)
Sodium: 139 mmol/L (ref 135–145)
Total Bilirubin: <0.1 mg/dL — ABNORMAL LOW (ref 0.3–1.2)
Total Protein: 4.8 g/dL — ABNORMAL LOW (ref 6.5–8.1)

## 2022-12-20 LAB — I-STAT CHEM 8, ED
BUN: 8 mg/dL (ref 8–23)
Calcium, Ion: 0.98 mmol/L — ABNORMAL LOW (ref 1.15–1.40)
Chloride: 111 mmol/L (ref 98–111)
Creatinine, Ser: 1 mg/dL (ref 0.44–1.00)
Glucose, Bld: 115 mg/dL — ABNORMAL HIGH (ref 70–99)
HCT: 28 % — ABNORMAL LOW (ref 36.0–46.0)
Hemoglobin: 9.5 g/dL — ABNORMAL LOW (ref 12.0–15.0)
Potassium: 2.8 mmol/L — ABNORMAL LOW (ref 3.5–5.1)
Sodium: 142 mmol/L (ref 135–145)
TCO2: 17 mmol/L — ABNORMAL LOW (ref 22–32)

## 2022-12-20 LAB — URINALYSIS, ROUTINE W REFLEX MICROSCOPIC
Bilirubin Urine: NEGATIVE
Glucose, UA: NEGATIVE mg/dL
Hgb urine dipstick: NEGATIVE
Ketones, ur: NEGATIVE mg/dL
Nitrite: NEGATIVE
Protein, ur: NEGATIVE mg/dL
Specific Gravity, Urine: 1.023 (ref 1.005–1.030)
pH: 7 (ref 5.0–8.0)

## 2022-12-20 LAB — CBC
HCT: 30.6 % — ABNORMAL LOW (ref 36.0–46.0)
HCT: 35.4 % — ABNORMAL LOW (ref 36.0–46.0)
Hemoglobin: 9.7 g/dL — ABNORMAL LOW (ref 12.0–15.0)
Hemoglobin: 11.3 g/dL — ABNORMAL LOW (ref 12.0–15.0)
MCH: 32 pg (ref 26.0–34.0)
MCH: 31.5 pg (ref 26.0–34.0)
MCHC: 31.7 g/dL (ref 30.0–36.0)
MCHC: 31.9 g/dL (ref 30.0–36.0)
MCV: 101 fL — ABNORMAL HIGH (ref 80.0–100.0)
MCV: 98.6 fL (ref 80.0–100.0)
Platelets: 158 10*3/uL (ref 150–400)
Platelets: 227 10*3/uL (ref 150–400)
RBC: 3.03 MIL/uL — ABNORMAL LOW (ref 3.87–5.11)
RBC: 3.59 MIL/uL — ABNORMAL LOW (ref 3.87–5.11)
RDW: 14.4 % (ref 11.5–15.5)
RDW: 14.4 % (ref 11.5–15.5)
WBC: 8.9 10*3/uL (ref 4.0–10.5)
WBC: 12.8 10*3/uL — ABNORMAL HIGH (ref 4.0–10.5)
nRBC: 0 % (ref 0.0–0.2)
nRBC: 0 % (ref 0.0–0.2)

## 2022-12-20 LAB — MAGNESIUM: Magnesium: 2 mg/dL (ref 1.7–2.4)

## 2022-12-20 LAB — I-STAT VENOUS BLOOD GAS, ED
Acid-base deficit: 8 mmol/L — ABNORMAL HIGH (ref 0.0–2.0)
Bicarbonate: 17.5 mmol/L — ABNORMAL LOW (ref 20.0–28.0)
Calcium, Ion: 1.03 mmol/L — ABNORMAL LOW (ref 1.15–1.40)
HCT: 27 % — ABNORMAL LOW (ref 36.0–46.0)
Hemoglobin: 9.2 g/dL — ABNORMAL LOW (ref 12.0–15.0)
O2 Saturation: 81 %
Potassium: 2.8 mmol/L — ABNORMAL LOW (ref 3.5–5.1)
Sodium: 141 mmol/L (ref 135–145)
TCO2: 18 mmol/L — ABNORMAL LOW (ref 22–32)
pCO2, Ven: 33.4 mm[Hg] — ABNORMAL LOW (ref 44–60)
pH, Ven: 7.326 (ref 7.25–7.43)
pO2, Ven: 48 mm[Hg] — ABNORMAL HIGH (ref 32–45)

## 2022-12-20 LAB — TSH: TSH: 67.015 u[IU]/mL — ABNORMAL HIGH (ref 0.350–4.500)

## 2022-12-20 LAB — MRSA NEXT GEN BY PCR, NASAL: MRSA by PCR Next Gen: NOT DETECTED

## 2022-12-20 LAB — LIPASE, BLOOD: Lipase: 23 U/L (ref 11–51)

## 2022-12-20 LAB — CREATININE, SERUM
Creatinine, Ser: 1.12 mg/dL — ABNORMAL HIGH (ref 0.44–1.00)
GFR, Estimated: 56 mL/min — ABNORMAL LOW (ref >60)

## 2022-12-20 LAB — GLUCOSE, CAPILLARY: Glucose-Capillary: 121 mg/dL — ABNORMAL HIGH (ref 70–99)

## 2022-12-20 LAB — TROPONIN I (HIGH SENSITIVITY)
Troponin I (High Sensitivity): 5 ng/L (ref <18)
Troponin I (High Sensitivity): 3 ng/L (ref <18)

## 2022-12-20 LAB — PHOSPHORUS: Phosphorus: 1.9 mg/dL — ABNORMAL LOW (ref 2.5–4.6)

## 2022-12-20 LAB — CORTISOL: Cortisol, Plasma: 27.6 ug/dL

## 2022-12-20 LAB — T4, FREE: Free T4: <0.25 ng/dL — ABNORMAL LOW (ref 0.61–1.12)

## 2022-12-20 LAB — LITHIUM LEVEL: Lithium Lvl: 0.92 mmol/L (ref 0.60–1.20)

## 2022-12-20 MED ORDER — HYDROXYZINE HCL 25 MG PO TABS
50.0000 mg | ORAL_TABLET | Freq: Two times a day (BID) | ORAL | Status: DC
  Administered 2022-12-20 – 2022-12-27 (×13): 50 mg via ORAL
  Filled 2022-12-20 (×13): qty 2

## 2022-12-20 MED ORDER — HYDROCORTISONE SOD SUC (PF) 100 MG IJ SOLR
100.0000 mg | Freq: Three times a day (TID) | INTRAMUSCULAR | Status: DC
  Administered 2022-12-20 – 2022-12-22 (×7): 100 mg via INTRAVENOUS
  Filled 2022-12-20 (×9): qty 2

## 2022-12-20 MED ORDER — SODIUM CHLORIDE 0.9 % IV BOLUS
1000.0000 mL | Freq: Once | INTRAVENOUS | Status: AC
  Administered 2022-12-20: 1000 mL via INTRAVENOUS

## 2022-12-20 MED ORDER — LEVOTHYROXINE SODIUM 100 MCG/5ML IV SOLN
100.0000 ug | Freq: Every day | INTRAVENOUS | Status: DC
  Administered 2022-12-21: 100 ug via INTRAVENOUS
  Filled 2022-12-20 (×2): qty 5

## 2022-12-20 MED ORDER — HYDROCORTISONE SOD SUC (PF) 100 MG IJ SOLR
100.0000 mg | Freq: Two times a day (BID) | INTRAMUSCULAR | Status: DC
Start: 2022-12-20 — End: 2022-12-20

## 2022-12-20 MED ORDER — HYDROCORTISONE SOD SUC (PF) 100 MG IJ SOLR: 100.0000 mg | Freq: Two times a day (BID) | INTRAMUSCULAR | Status: DC

## 2022-12-20 MED ORDER — POLYETHYLENE GLYCOL 3350 17 G PO PACK: 17.0000 g | PACK | Freq: Every day | ORAL | Status: DC | PRN

## 2022-12-20 MED ORDER — ATROPINE SULFATE 1 MG/10ML IJ SOSY
1.0000 mg | PREFILLED_SYRINGE | INTRAMUSCULAR | Status: AC | PRN
  Administered 2022-12-20: 1 mg via INTRAVENOUS
  Filled 2022-12-20: qty 10

## 2022-12-20 MED ORDER — PANTOPRAZOLE SODIUM 40 MG IV SOLR
40.0000 mg | Freq: Two times a day (BID) | INTRAVENOUS | Status: DC
  Administered 2022-12-20 – 2022-12-21 (×4): 40 mg via INTRAVENOUS
  Filled 2022-12-20 (×4): qty 10

## 2022-12-20 MED ORDER — SODIUM CHLORIDE 0.9 % IV SOLN
250.0000 mL | INTRAVENOUS | Status: DC
  Administered 2022-12-20: 250 mL via INTRAVENOUS

## 2022-12-20 MED ORDER — CHLORHEXIDINE GLUCONATE CLOTH 2 % EX PADS
6.0000 | MEDICATED_PAD | Freq: Every day | CUTANEOUS | Status: DC
  Administered 2022-12-20: 6 via TOPICAL

## 2022-12-20 MED ORDER — ONDANSETRON HCL 4 MG/2ML IJ SOLN
4.0000 mg | Freq: Three times a day (TID) | INTRAMUSCULAR | Status: DC | PRN
  Administered 2022-12-20 – 2022-12-25 (×5): 4 mg via INTRAVENOUS
  Filled 2022-12-20 (×5): qty 2

## 2022-12-20 MED ORDER — VANCOMYCIN HCL IN DEXTROSE 1-5 GM/200ML-% IV SOLN
1000.0000 mg | INTRAVENOUS | Status: DC
Start: 2022-12-20 — End: 2022-12-21
  Administered 2022-12-20: 1000 mg via INTRAVENOUS
  Filled 2022-12-20 (×2): qty 200

## 2022-12-20 MED ORDER — PROCHLORPERAZINE EDISYLATE 10 MG/2ML IJ SOLN
10.0000 mg | Freq: Once | INTRAMUSCULAR | Status: AC
  Administered 2022-12-20: 10 mg via INTRAVENOUS

## 2022-12-20 MED ORDER — LEVOTHYROXINE SODIUM 100 MCG/5ML IV SOLN
200.0000 ug | Freq: Once | INTRAVENOUS | Status: AC
  Administered 2022-12-20: 200 ug via INTRAVENOUS
  Filled 2022-12-20: qty 10

## 2022-12-20 MED ORDER — HYDROCORTISONE SOD SUC (PF) 100 MG IJ SOLR: 100.0000 mg | Freq: Three times a day (TID) | INTRAMUSCULAR | Status: DC

## 2022-12-20 MED ORDER — IOHEXOL 350 MG/ML SOLN
75.0000 mL | Freq: Once | INTRAVENOUS | Status: AC | PRN
  Administered 2022-12-20: 75 mL via INTRAVENOUS

## 2022-12-20 MED ORDER — PIPERACILLIN-TAZOBACTAM 3.375 G IVPB
3.3750 g | Freq: Three times a day (TID) | INTRAVENOUS | Status: DC
  Administered 2022-12-20 – 2022-12-21 (×3): 3.375 g via INTRAVENOUS
  Filled 2022-12-20 (×4): qty 50

## 2022-12-20 MED ORDER — ORAL CARE MOUTH RINSE: 15.0000 mL | OROMUCOSAL | Status: DC | PRN

## 2022-12-20 MED ORDER — DIPHENHYDRAMINE HCL 50 MG/ML IJ SOLN
25.0000 mg | Freq: Once | INTRAMUSCULAR | Status: AC
  Administered 2022-12-20: 25 mg via INTRAVENOUS

## 2022-12-20 MED ORDER — LEVOTHYROXINE SODIUM 100 MCG/5ML IV SOLN
100.0000 ug | Freq: Every day | INTRAVENOUS | Status: DC
Start: 2022-12-20 — End: 2022-12-20

## 2022-12-20 MED ORDER — ATROPINE SULFATE 1 MG/10ML IJ SOSY
1.0000 mg | PREFILLED_SYRINGE | Freq: Once | INTRAMUSCULAR | Status: AC
  Administered 2022-12-20: 1 mg via INTRAVENOUS
  Filled 2022-12-20: qty 10

## 2022-12-20 MED ORDER — DOCUSATE SODIUM 100 MG PO CAPS
100.0000 mg | ORAL_CAPSULE | Freq: Two times a day (BID) | ORAL | Status: DC | PRN
  Administered 2022-12-25: 100 mg via ORAL
  Filled 2022-12-20: qty 1

## 2022-12-20 MED ORDER — NOREPINEPHRINE 4 MG/250ML-% IV SOLN
2.0000 ug/min | INTRAVENOUS | Status: DC
  Administered 2022-12-20: 2 ug/min via INTRAVENOUS
  Administered 2022-12-21: 3 ug/min via INTRAVENOUS
  Filled 2022-12-20 (×2): qty 250

## 2022-12-20 MED ORDER — LAMOTRIGINE 100 MG PO TABS
200.0000 mg | ORAL_TABLET | Freq: Two times a day (BID) | ORAL | Status: DC
  Administered 2022-12-20 – 2022-12-27 (×14): 200 mg via ORAL
  Filled 2022-12-20 (×8): qty 2
  Filled 2022-12-20 (×2): qty 8
  Filled 2022-12-20 (×5): qty 2

## 2022-12-20 MED ORDER — DEXTROSE IN LACTATED RINGERS 5 % IV SOLN: INTRAVENOUS | Status: AC

## 2022-12-20 MED ORDER — POTASSIUM CHLORIDE 10 MEQ/100ML IV SOLN
10.0000 meq | INTRAVENOUS | Status: AC
  Administered 2022-12-20 (×2): 10 meq via INTRAVENOUS
  Filled 2022-12-20 (×2): qty 100

## 2022-12-20 MED ORDER — HEPARIN SODIUM (PORCINE) 5000 UNIT/ML IJ SOLN
5000.0000 [IU] | Freq: Three times a day (TID) | INTRAMUSCULAR | Status: DC
  Administered 2022-12-20 – 2022-12-27 (×20): 5000 [IU] via SUBCUTANEOUS
  Filled 2022-12-20 (×19): qty 1

## 2022-12-20 MED ORDER — POTASSIUM PHOSPHATES 15 MMOLE/5ML IV SOLN
30.0000 mmol | Freq: Once | INTRAVENOUS | Status: AC
  Administered 2022-12-20: 30 mmol via INTRAVENOUS
  Filled 2022-12-20: qty 10

## 2022-12-20 NOTE — H&P (Signed)
NAME:  Rachel Everett, MRN:  [id number], DOB:  Apr 24, 1960, LOS: 0 ADMISSION DATE:  12/20/2022, CONSULTATION DATE:  10/5 REFERRING MD:  Rachel Everett, CHIEF COMPLAINT:  bradycardia and shock    History of Present Illness:  62 year old female patient with multiple medical comorbidities as listed below.  History primarily provided by her her daughter Rachel Everett.  Brought to the emergency room on 10/5With patient reporting nausea and vomiting, and weakness.  Patient has been off medications for at least a month if not more specifically not taking Synthroid.  Had also missed at least 4 days of her Lamictal and lithium.  However onset of deterioration noted for over about a month's time now, specifically daughter noted decreased activity, always feeling cold and wanting blankets, poor appetite, and more recently difficulty walking and having trouble with her balance.  As noted the nausea and vomiting had also gotten worse which was the final driving factor to bring her to the emergency room.  On arrival she was found to be bradycardic with heart rate in the 40s to 50s, hypotensive with systolic blood pressure in the 70s, hypothermic with temperature of 92.3.  She was lethargic but oriented x 3 initial hemoglobin 9.5 white blood cell count normal at 8.9 had a nonanion gap metabolic acidosis, and mild AKI initial TSH was 67.015 initial venous blood gas pH 7.33 pCO2 of 33 pO2 of 48 HCO3 17.5.  She was administered IV fluids, cultures were sent, cortisol sent, free T and free T4 sent, stress dose steroids initiated, and because of her ongoing hypotension norepinephrine infusion was started and critical care asked to admit. Pertinent  Medical History  Hypothyroidism, bipolar disease, PTSD, agoraphobia, Asperger HL, GERD  Significant Hospital Events: Including procedures, antibiotic start and stop dates in addition to other pertinent events   10/5 admitted, lethargic, heart rate in the 40s to 50s, hypotensive requiring  norepinephrine.  TSH 67.015, started on norepinephrine, stress dose steroids, loaded with 200 of levothyroxine with plan to continue 100 IV daily admitted to ICU  Interim History / Subjective:  Denies pain or shortness of breath  Objective   Blood pressure (!) 86/59, pulse (!) 56, temperature (!) 92.3 F (33.5 C), temperature source Rectal, resp. rate 10, height 5\' 6"  (1.676 m), weight 94.4 kg, SpO2 96%.        Intake/Output Summary (Last 24 hours) at 12/20/2022 1009 Last data filed at 12/20/2022 8657 Gross per 24 hour  Intake 1100 ml  Output --  Net 1100 ml   Filed Weights   12/20/22 8469  Weight: 94.4 kg    Examination: General: Acutely ill 62 year old female she is lethargic and lying in bed currently no acute distress HENT: Mucous membranes are slightly dry, neck veins flat normocephalic atraumatic Lungs: Clear decreased bases Regular rate and rhythm EKG showing junctional rhythm now in the 60s no murmur rub or gallop Abdomen soft nontender no organomegaly Extremities: Warm dry trace edema Neuro slow to respond speech is very quiet but she is oriented x 3 she has got generalized profound weakness but no focal motor deficits are appreciated Resolved Hospital Problem list     Assessment & Plan:  Shock (undifferentiated), with symptomatic bradycardia.  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  Patient has not taken her medications for at least 4 weeks, likely longer medical compliance has been a longstanding issue.  Need to rule out sepsis as contributing factor. Plan Admit to the intensive care Continue telemetry monitoring Send blood cultures Empiric antibiotics for now  Continue norepinephrine Check cortisol Continue IV hydration  Junctional heart rhythm.  Initially presented bradycardic with heart rate in 40s.  Cardiology has seen.   Plan Continuing telemetry monitoring Holding propranolol Treating shock Treating hypothyroidism  Myxedema coma w/  hypothermia  Has not taking her medications for several weeks.  Has a history of noncompliance.  Bradycardic, hypothermic ,hypotensive, lethargic TSH almost 70. Plan Follow-up free T4 Loading levothyroxine 200 mcg IV now followed by 100 mcg daily Cortisols been sent Initiate stress dose steroids (dosing is 100 g8 for this) Empiric abx Tele  Repeat TSH and free t4 tomorrow.   Acute metabolic encephalopathy  Mostly 2/2 myxedema coma but also has h/o bipolar disease  Plan Supportive care Holding sedating meds for now Will cont lamcital  Hold lithium  Supportive care  AKI Plan Cont IVFs Serial chems Renal dose meds Strict I&O  Hypokalemia Plan Replace and recheck  N&V -lipase neg Plan Teat as needed   GERD Plan PPI Best Practice (right click and "Reselect all SmartList Selections" daily)   Diet/type: NPO DVT prophylaxis: prophylactic heparin  GI prophylaxis: PPI Lines: N/A Foley:  Yes, and it is still needed Code Status:  full code Last date of multidisciplinary goals of care discussion [pending]  Labs   CBC: Recent Labs  Lab 12/20/22 0728 12/20/22 0746 12/20/22 0747  WBC 8.9  --   --   HGB 9.7* 9.2* 9.5*  HCT 30.6* 27.0* 28.0*  MCV 101.0*  --   --   PLT 158  --   --     Basic Metabolic Panel: Recent Labs  Lab 12/20/22 0728 12/20/22 0746 12/20/22 0747  NA 139 141 142  K 3.0* 2.8* 2.8*  CL 111  --  111  CO2 18*  --   --   GLUCOSE 121*  --  115*  BUN 9  --  8  CREATININE 1.14*  --  1.00  CALCIUM 7.3*  --   --    GFR: Estimated Creatinine Clearance: 67.5 mL/min (by C-G formula based on SCr of 1 mg/dL). Recent Labs  Lab 12/20/22 0728  WBC 8.9    Liver Function Tests: Recent Labs  Lab 12/20/22 0728  AST 24  ALT 21  ALKPHOS 60  BILITOT <0.1*  PROT 4.8*  ALBUMIN 2.8*   Recent Labs  Lab 12/20/22 0728  LIPASE 23   No results for input(s): "AMMONIA" in the last 168 hours.  ABG    Component Value Date/Time   HCO3 17.5 (L)  12/20/2022 0746   TCO2 17 (L) 12/20/2022 0747   ACIDBASEDEF 8.0 (H) 12/20/2022 0746   O2SAT 81 12/20/2022 0746     Coagulation Profile: No results for input(s): "INR", "PROTIME" in the last 168 hours.  Cardiac Enzymes: No results for input(s): "CKTOTAL", "CKMB", "CKMBINDEX", "TROPONINI" in the last 168 hours.  HbA1C: Hemoglobin A1C  Date/Time Value Ref Range Status  05/14/2016 10:07 AM 5.0  Final    CBG: No results for input(s): "GLUCAP" in the last 168 hours.  Review of Systems:   Per hx above   Past Medical History:  She,  has a past medical history of Anxiety, Bipolar 1 disorder (HCC), Depression, GERD (gastroesophageal reflux disease), Hyperlipidemia, SVD (spontaneous vaginal delivery), and Thyroid disease.   Surgical History:   Past Surgical History:  Procedure Laterality Date   ANKLE SURGERY     right    ANTERIOR CRUCIATE LIGAMENT REPAIR Left    BACK SURGERY     L5-S1  CHOLECYSTECTOMY     FRACTURE SURGERY Left    elbow   TENDON REPAIR Right    angle   TONSILLECTOMY       Social History:   reports that she quit smoking about 7 years ago. Her smoking use included cigarettes. She started smoking about 8 years ago. She has a 0.5 pack-year smoking history. She has never used smokeless tobacco. She reports current alcohol use. She reports that she does not use drugs.   Family History:  Her family history includes Cancer in her maternal grandmother and mother. There is no history of Colon cancer, Esophageal cancer, Rectal cancer, or Stomach cancer.   Allergies No Known Allergies   Home Medications  Prior to Admission medications   Medication Sig Start Date End Date Taking? Authorizing Provider  atorvastatin (LIPITOR) 20 MG tablet Take 1 tablet (20 mg total) by mouth daily. 08/06/21   Hoy Register, MD  lamoTRIgine (LAMICTAL) 200 MG tablet Take 200 mg by mouth 2 (two) times daily.    [provider]  levothyroxine (SYNTHROID) 137 MCG tablet TAKE 1  TABLET (137 MCG TOTAL) BY MOUTH DAILY. 01/21/21 01/21/22  Claiborne Rigg, NP  lithium carbonate (LITHOBID) 300 MG CR tablet Take 300 mg by mouth 3 (three) times daily.  06/27/14   [provider]  omeprazole (PRILOSEC) 20 MG capsule Take 1 capsule (20 mg total) by mouth daily. 08/06/21   Hoy Register, MD  propranolol (INDERAL) 10 MG tablet  04/19/18   [provider]  QUEtiapine (SEROQUEL XR) 50 MG TB24 24 hr tablet Take 50 mg by mouth daily as needed.    [provider]  Vitamin D, Ergocalciferol, (DRISDOL) 1.25 MG (50000 UNIT) CAPS capsule Take 1 capsule (50,000 Units total) by mouth every 7 (seven) days. 01/23/21   Claiborne Rigg, NP     Critical care time: 45 min     Simonne Martinet ACNP-BC [facility] Pager # [phone] OR # [phone] if no answer   m

## 2022-12-20 NOTE — Progress Notes (Signed)
Pharmacy Antibiotic Note  Rachel Everett is a 62 y.o. female admitted on 12/20/2022 with sepsis.  Pharmacy has been consulted for vancomycin and zosyn dosing.  Plan: Zosyn 3.375g IV q8h (4 hour infusion). Start 1000mg  q24h Vancomycin for eAUC 426 using IBW and Vd 0.5. Follow culture data for de-escalation.  Monitor renal function for dose adjustments as indicated.   Height: 5\' 6"  (167.6 cm) Weight: 94.4 kg (208 lb 1.8 oz) IBW/kg (Calculated) : 59.3  Temp (24hrs), Avg:92.3 F (33.5 C), Min:92.3 F (33.5 C), Max:92.3 F (33.5 C)  Recent Labs  Lab 12/20/22 0728 12/20/22 0747  WBC 8.9  --   CREATININE 1.14* 1.00    Estimated Creatinine Clearance: 67.5 mL/min (by C-G formula based on SCr of 1 mg/dL).    No Known Allergies  Thank you for allowing pharmacy to be a part of this patient's care.  Estill Batten, PharmD, BCCCP  12/20/2022 10:20 AM

## 2022-12-20 NOTE — Progress Notes (Signed)
eLink Physician-Brief Progress Note Patient Name: Rachel Everett DOB: August 26, 1960 MRN: [id number]   Date of Service  12/21/2022  HPI/Events of Note  62 year old female with bipolar disorder and hypothyroidism on lithium, noncompliant with levothyroxine who presented with nausea, vomiting and generalized weakness, noted to be hypothermic, hypotensive and bradycardic admitted with shock from myxedema.  Ongoing nausea.  Requesting home hydroxyzine.  eICU Interventions  Resume home hydroxyzine  As needed Zofran.   0129 -ongoing insomnia despite hydroxyzine and Zofran.  Will add as needed melatonin.  Intervention Category Minor Interventions: Routine modifications to care plan (e.g. PRN medications for pain, fever)  Annelise Mccoy 12/21/2022, 1:30 AM

## 2022-12-20 NOTE — ED Notes (Signed)
Pt to CT then MICU by RN.

## 2022-12-20 NOTE — ED Provider Notes (Signed)
Rachel Everett Provider Note   CSN: [id number] Arrival date & time: 12/20/22  0865     History  Chief Complaint  Patient presents with   Hypotension    Rachel Everett is a 62 y.o. female.  HPI    62 year old female comes in with chief complaint of hypertension, nausea and vomiting. She is accompanied by her daughter, who provides substantial part of the history.  Patient has history of bipolar disorder for which she is on lithium, thyroid disorder for which she is on levothyroxine.  According to the daughter, patient has been sick over the last 2 weeks with nausea, but she suddenly got significantly worse at 2 AM.  Patient started screaming for help.  Patient states that she has had multiple episodes of emesis and they are green in nature.  She has history of gallbladder surgery, but has not had significant abdominal pain.  Patient initially states that she has had diarrhea, but unable to provide clarification on how many bowel movements she has been having in a day.  She denies any abdominal pain, but does grimace on exam.  The daughter indicates that patient was off of her medication for several weeks due to insurance/access issues but restarted them just within the last week.  Patient denies any UTI-like symptoms.  There is no substance use disorder.  Home Medications Prior to Admission medications   Medication Sig Start Date End Date Taking? Authorizing Provider  atorvastatin (LIPITOR) 20 MG tablet Take 1 tablet (20 mg total) by mouth daily. 08/06/21   Hoy Register, MD  lamoTRIgine (LAMICTAL) 200 MG tablet Take 200 mg by mouth 2 (two) times daily.    [provider]  levothyroxine (SYNTHROID) 137 MCG tablet TAKE 1 TABLET (137 MCG TOTAL) BY MOUTH DAILY. 01/21/21 01/21/22  Claiborne Rigg, NP  lithium carbonate (LITHOBID) 300 MG CR tablet Take 300 mg by mouth 3 (three) times daily.  06/27/14   [provider]  omeprazole  (PRILOSEC) 20 MG capsule Take 1 capsule (20 mg total) by mouth daily. 08/06/21   Hoy Register, MD  propranolol (INDERAL) 10 MG tablet  04/19/18   [provider]  QUEtiapine (SEROQUEL XR) 50 MG TB24 24 hr tablet Take 50 mg by mouth daily as needed.    [provider]  Vitamin D, Ergocalciferol, (DRISDOL) 1.25 MG (50000 UNIT) CAPS capsule Take 1 capsule (50,000 Units total) by mouth every 7 (seven) days. 01/23/21   Claiborne Rigg, NP      Allergies    Patient has no known allergies.    Review of Systems   Review of Systems  All other systems reviewed and are negative.   Physical Exam Updated Vital Signs BP (!) 86/59   Pulse (!) 56   Temp (!) 92.3 F (33.5 C) (Rectal)   Resp 10   Ht 5\' 6"  (1.676 m)   Wt 94.4 kg   SpO2 96%   BMI 33.59 kg/m  Physical Exam Vitals and nursing note reviewed.  Constitutional:      Appearance: She is well-developed. She is ill-appearing and toxic-appearing.     Comments: Somnolent  HENT:     Head: Atraumatic.  Eyes:     Extraocular Movements: Extraocular movements intact.     Pupils: Pupils are equal, round, and reactive to light.  Cardiovascular:     Rate and Rhythm: Bradycardia present.  Pulmonary:     Effort: Pulmonary effort is normal.  Abdominal:  Tenderness: There is abdominal tenderness. There is no guarding or rebound.     Comments: Generalized lower quadrant abdominal tenderness  Musculoskeletal:     Cervical back: Neck supple.  Skin:    General: Skin is dry.  Neurological:     Mental Status: She is oriented to person, place, and time.     Cranial Nerves: No cranial nerve deficit.     ED Results / Procedures / Treatments   Labs (all labs ordered are listed, but only abnormal results are displayed) Labs Reviewed  CBC - Abnormal; Notable for the following components:      Result Value   RBC 3.03 (*)    Hemoglobin 9.7 (*)    HCT 30.6 (*)    MCV 101.0 (*)    All other components within normal limits   COMPREHENSIVE METABOLIC PANEL - Abnormal; Notable for the following components:   Potassium 3.0 (*)    CO2 18 (*)    Glucose, Bld 121 (*)    Creatinine, Ser 1.14 (*)    Calcium 7.3 (*)    Total Protein 4.8 (*)    Albumin 2.8 (*)    Total Bilirubin <0.1 (*)    GFR, Estimated 54 (*)    All other components within normal limits  TSH - Abnormal; Notable for the following components:   TSH 67.015 (*)    All other components within normal limits  I-STAT CHEM 8, ED - Abnormal; Notable for the following components:   Potassium 2.8 (*)    Glucose, Bld 115 (*)    Calcium, Ion 0.98 (*)    TCO2 17 (*)    Hemoglobin 9.5 (*)    HCT 28.0 (*)    All other components within normal limits  I-STAT VENOUS BLOOD GAS, ED - Abnormal; Notable for the following components:   pCO2, Ven 33.4 (*)    pO2, Ven 48 (*)    Bicarbonate 17.5 (*)    TCO2 18 (*)    Acid-base deficit 8.0 (*)    Potassium 2.8 (*)    Calcium, Ion 1.03 (*)    HCT 27.0 (*)    Hemoglobin 9.2 (*)    All other components within normal limits  CULTURE, BLOOD (ROUTINE X 2)  CULTURE, BLOOD (ROUTINE X 2)  LIPASE, BLOOD  MAGNESIUM  PHOSPHORUS  LITHIUM LEVEL  T3, FREE  T4, FREE  CORTISOL  HIV ANTIBODY (ROUTINE TESTING W REFLEX)  CBC  CREATININE, SERUM  URINALYSIS, ROUTINE W REFLEX MICROSCOPIC  TROPONIN I (HIGH SENSITIVITY)  TROPONIN I (HIGH SENSITIVITY)    EKG EKG Interpretation Date/Time:  Saturday December 20 2022 07:19:12 EDT Ventricular Rate:  47 PR Interval:    QRS Duration:  110 QT Interval:  547 QTC Calculation: 484 R Axis:   99  Text Interpretation: AV block, complete (third degree) Right axis deviation Low voltage, precordial leads Borderline T abnormalities, anterior leads Confirmed by Kennis Carina [phone]) on 12/20/2022 7:24:35 AM  POST ATROPINE  EKG Interpretation Date/Time:  Saturday December 20 2022 07:57:33 EDT Ventricular Rate:  61 PR Interval:    QRS Duration:  116 QT Interval:  499 QTC  Calculation: 503 R Axis:   83  Text Interpretation: Junctional rhythm Nonspecific intraventricular conduction delay Low voltage, extremity and precordial leads artifact makes it a little challenging - junctional vs. sinus vs. 3rd degree AV block Confirmed by Derwood Kaplan [phone]) on 12/20/2022 8:10:42 AM         Radiology DG Chest [facility] 1 View  Result  Date: 12/20/2022 CLINICAL DATA:  Nausea, vomiting and spurring. EXAM: PORTABLE CHEST 1 VIEW COMPARISON:  PA Lat 07/03/2015 FINDINGS: Interval low inspiration on exam. Bronchovascular crowding particularly in the bases. Basilar infiltrate is not excluded. The remaining lungs are clear. The sulci are sharp. Heart size and vasculature are normal for expiration. The mediastinum is stable with mild aortic atherosclerosis. Multiple overlying monitor wires.  No new osseous findings. IMPRESSION: Interval low inspiration with bronchovascular crowding. Basilar infiltrate is not excluded. No other evidence of acute chest findings. Follow-up PA and lateral with full inspiration is recommended. Electronically Signed   By: Almira Bar M.D.   On: 12/20/2022 07:36    Procedures .Critical Care  Performed by: Derwood Kaplan, MD Authorized by: Derwood Kaplan, MD   Critical care provider statement:    Critical care time (minutes):  85   Critical care was necessary to treat or prevent imminent or life-threatening deterioration of the following conditions:  CNS failure or compromise, circulatory failure and cardiac failure   Critical care was time spent personally by me on the following activities:  Development of treatment plan with patient or surrogate, discussions with consultants, evaluation of patient's response to treatment, examination of patient, ordering and review of laboratory studies, ordering and review of radiographic studies, ordering and performing treatments and interventions, pulse oximetry, re-evaluation of patient's condition, review of old  charts and obtaining history from patient or surrogate     Medications Ordered in ED Medications  potassium chloride 10 mEq in 100 mL IVPB (10 mEq Intravenous New Bag/Given 12/20/22 0928)  0.9 %  sodium chloride infusion (250 mLs Intravenous New Bag/Given 12/20/22 0909)  norepinephrine (LEVOPHED) 4mg  in (0.016 mg/mL) premix infusion (5 mcg/min Intravenous Rate/Dose Change 12/20/22 0956)  levothyroxine (SYNTHROID, LEVOTHROID) injection 200 mcg (has no administration in time range)  levothyroxine (SYNTHROID, LEVOTHROID) injection 100 mcg (has no administration in time range)  hydrocortisone sodium succinate (SOLU-CORTEF) 100 MG injection 100 mg (has no administration in time range)  lamoTRIgine (LAMICTAL) tablet 200 mg (has no administration in time range)  docusate sodium (COLACE) capsule 100 mg (has no administration in time range)  polyethylene glycol (MIRALAX / GLYCOLAX) packet 17 g (has no administration in time range)  heparin injection 5,000 Units (has no administration in time range)  dextrose 5 % in lactated ringers infusion (has no administration in time range)  prochlorperazine (COMPAZINE) injection 10 mg (10 mg Intravenous Given 12/20/22 0704)  diphenhydrAMINE (BENADRYL) injection 25 mg (25 mg Intravenous Given 12/20/22 0707)  sodium chloride 0.9 % bolus 1,000 mL (0 mLs Intravenous Stopped 12/20/22 0851)  atropine 1 MG/10ML injection 1 mg (1 mg Intravenous Given 12/20/22 0730)  atropine 1 MG/10ML injection 1 mg (1 mg Intravenous Given 12/20/22 0737)    ED Course/ Medical Decision Making/ A&P                                 Medical Decision Making Amount and/or Complexity of Data Reviewed Labs: ordered. Radiology: ordered.  Risk Prescription drug management.   This patient presents to the ED with chief complaint(s) of severe nausea, vomiting and found to be bradycardic, hypotensive with pertinent past medical history of hypothyroidism, bipolar disorder on lithium with  recent initiation of her medications and cholecystectomy.patient is having bilious emesis, generalized abdominal tenderness and she is hypotensive, hypothermic and bradycardic.  Initial EKG shows what appears to be third-degree AV block.  The complaint involves an  extensive differential diagnosis and also carries with it a high risk of complications and morbidity.    The differential diagnosis includes : Cardiogenic shock because of third-degree AV block, adrenal insufficiency/adrenal crisis, myxedema coma, hyperkalemia, renal failure, toxic encephalopathy, small bowel obstruction, acute coronary syndrome. History not clearly indicated of infection.  It appears that patient has been feeling unwell the last several days and got acutely worse overnight.  Daughter indicates that patient was going in and out of responsiveness so wondering if she was also having syncope.  The initial plan is to stabilize the patient.  Paramedics had already given her a liter of fluid.  She received additional liter of bolus in the ER. Patient received medications for her nausea and vomiting. Thereafter, we gave her atropine 1 mg for her bradycardia.  Her heart rate responded immediately and went up over 60.  Repeat EKG shows what appears to be junctional rhythm.  We have sent appropriate labs including blood gas, electrolytes, TSH and lithium level.  I have reviewed care everywhere, patient was admitted at outside hospital last year because of syncope.  Daughter does not recall the details around that admission, but patient signed out AMA in the hospital had intended to do additional treatment.  Additional history obtained: Additional history obtained from family Records reviewed Care Everywhere/External Records.  Independent labs interpretation:  The following labs were independently interpreted: Patient has hypokalemia with potassium 2.8.  She is noted to have anemia with hemoglobin of 9.4 and her TSH is over 60.   Free T3, T4 sent.  Independent visualization and interpretation of imaging: - I independently visualized the following imaging with scope of interpretation limited to determining acute life threatening conditions related to emergency care: CHEST XRAY, which revealed NO PNEUMOTHORAX, NO PULM EDEMA, NO CLEAR PNEUMONIA.  Treatment and Reassessment: Patient reassessed multiple times. Patient did respond to atropine.  Blood pressure remains low, with the MAP is still over 60.  Overall patient is appearing less toxic.  Consulted cardiology immediately once the potassium was normal. Spoke with Dr. Jimmey Ralph, cardiology.  He is independently assessed the patient.  He is reviewed patient's EKG and is concerned that the bradycardia is secondary to endocrine dysfunction.   For now, he is recommending supportive treatment including pressors if needed, but we are going to hold off of invasive cardiac intervention.  They will continue to follow the patient.  We decided to start patient on low-dose peripheral norepinephrine.  BP has responded to it.  Heart rate is remained over 50.  Patient has become slightly more alert, but still somnolent.  On repeat abdominal exam she is having mild abdominal discomfort.  Plan is to give her stress dose steroid, IV levothyroxine.  Free T3, T4 have been sent, but the turnaround day will be more than 24 hours.  Critical care consulted, they will admit the patient.  They will initiate central line if needed.  Family has made aware of the developments including admission request and clinical gestalt in the ER, they are aware that we will have further clarity once results start pending.  Patient remained stable but critical.  Final Clinical Impression(s) / ED Diagnoses Final diagnoses:  Cardiogenic shock (HCC)  Myxedema coma (HCC)  Acute hypokalemia    Rx / DC Orders ED Discharge Orders     None         Derwood Kaplan, MD 12/20/22 1020

## 2022-12-20 NOTE — ED Triage Notes (Signed)
Pt bibgcems from home with nausea vomiting pale and diaphoretic. No blood thinners 1500 ml fluid given with ems.

## 2022-12-20 NOTE — ED Notes (Signed)
ED TO INPATIENT HANDOFF REPORT  ED Nurse Name and Phone #: Dahlia Client [phone]  S Name/Age/Gender Rachel Everett 62 y.o. female Room/Bed: 030C/030C  Code Status   Code Status: Full Code  Home/SNF/Other Home Patient oriented to: self, place, time, and situation Is this baseline? 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 No blood thinners 1500 ml fluid given with ems.    Allergies No Known Allergies  Level of Care/Admitting Diagnosis ED Disposition     ED Disposition  Admit   Condition  --   Comment  Hospital Area: [facility] [[id number]]  Level of Care: ICU [6]  May admit patient to [facility] or [facility] if equivalent level of care is available:: Yes  Covid Evaluation: Asymptomatic - no recent exposure (last 10 days) testing not required  Diagnosis: Myxedema coma [facility]) [[id number]]  Admitting Physician: Cheri Fowler [[id number]]  Attending Physician: Cheri Fowler [[id number]]  Certification:: I certify this patient will need inpatient services for at least 2 midnights  Expected Medical Readiness: 12/23/2022          B Medical/Surgery History Past Medical History:  Diagnosis Date   Anxiety    Bipolar 1 disorder (HCC)    Depression    GERD (gastroesophageal reflux disease)    Hyperlipidemia    SVD (spontaneous vaginal delivery)    x 1   Thyroid disease    Past Surgical History:  Procedure Laterality Date   ANKLE SURGERY     right    ANTERIOR CRUCIATE LIGAMENT REPAIR Left    BACK SURGERY     L5-S1   CHOLECYSTECTOMY     FRACTURE SURGERY Left    elbow   TENDON REPAIR Right    angle   TONSILLECTOMY       A IV Location/Drains/Wounds Patient Lines/Drains/Airways Status     Active Line/Drains/Airways     Name Placement date Placement time Site Days   Peripheral IV 12/20/22 18 G 1" Left Antecubital 12/20/22  0624   Antecubital  less than 1   Peripheral IV 12/20/22 20 G 1" Anterior;Proximal;Right Forearm 12/20/22  0619  Forearm  less than 1            Intake/Output Last 24 hours  Intake/Output Summary (Last 24 hours) at 12/20/2022 1058 Last data filed at 12/20/2022 1028 Gross per 24 hour  Intake 1200 ml  Output --  Net 1200 ml    Labs/Imaging Results for orders placed or performed during the hospital encounter of 12/20/22 (from the past 48 hour(s))  CBC     Status: Abnormal   Collection Time: 12/20/22  7:28 AM  Result Value Ref Range   WBC 8.9 4.0 - 10.5 K/uL   RBC 3.03 (L) 3.87 - 5.11 MIL/uL   Hemoglobin 9.7 (L) 12.0 - 15.0 g/dL   HCT 72.5 (L) 36.6 - 44.0 %   MCV 101.0 (H) 80.0 - 100.0 fL   MCH 32.0 26.0 - 34.0 pg   MCHC 31.7 30.0 - 36.0 g/dL   RDW 34.7 42.5 - 95.6 %   Platelets 158 150 - 400 K/uL   nRBC 0.0 0.0 - 0.2 %    Comment: Performed at [facility], [address], [place], [place] [address]  Comprehensive metabolic panel     Status: Abnormal   Collection Time: 12/20/22  7:28 AM  Result Value Ref Range  Sodium 139 135 - 145 mmol/L   Potassium 3.0 (L) 3.5 - 5.1 mmol/L   Chloride 111 98 - 111 mmol/L   CO2 18 (L) 22 - 32 mmol/L   Glucose, Bld 121 (H) 70 - 99 mg/dL    Comment: Glucose reference range applies only to samples taken after fasting for at least 8 hours.   BUN 9 8 - 23 mg/dL   Creatinine, Ser 4.09 (H) 0.44 - 1.00 mg/dL   Calcium 7.3 (L) 8.9 - 10.3 mg/dL   Total Protein 4.8 (L) 6.5 - 8.1 g/dL   Albumin 2.8 (L) 3.5 - 5.0 g/dL   AST 24 15 - 41 U/L   ALT 21 0 - 44 U/L   Alkaline Phosphatase 60 38 - 126 U/L   Total Bilirubin <0.1 (L) 0.3 - 1.2 mg/dL   GFR, Estimated 54 (L) >60 mL/min    Comment: (NOTE) Calculated using the CKD-EPI Creatinine Equation (2021)    Anion gap 10 5 - 15    Comment: Performed at [facility], [address] [place], [place] [address]  Lipase, blood     Status: None   Collection Time: 12/20/22  7:28 AM  Result Value Ref  Range   Lipase 23 11 - 51 U/L    Comment: Performed at [facility], [address], [place], [place] [address]  Troponin I (High Sensitivity)     Status: None   Collection Time: 12/20/22  7:28 AM  Result Value Ref Range   Troponin I (High Sensitivity) 5 <18 ng/L    Comment: (NOTE) Elevated high sensitivity troponin I (hsTnI) values and significant  changes across serial measurements may suggest ACS but many other  chronic and acute conditions are known to elevate hsTnI results.  Refer to the "Links" section for chest pain algorithms and additional  guidance. Performed at [facility], [address] [place], [place] [address]   TSH     Status: Abnormal   Collection Time: 12/20/22  7:28 AM  Result Value Ref Range   TSH 67.015 (H) 0.350 - 4.500 uIU/mL    Comment: Performed by a 3rd Generation assay with a functional sensitivity of <=0.01 uIU/mL. Performed at [facility], [address], [place], [place] [address]   I-Stat venous blood gas, ED     Status: Abnormal   Collection Time: 12/20/22  7:46 AM  Result Value Ref Range   pH, Ven 7.326 7.25 - 7.43   pCO2, Ven 33.4 (L) 44 - 60 mmHg   pO2, Ven 48 (H) 32 - 45 mmHg   Bicarbonate 17.5 (L) 20.0 - 28.0 mmol/L   TCO2 18 (L) 22 - 32 mmol/L   O2 Saturation 81 %   Acid-base deficit 8.0 (H) 0.0 - 2.0 mmol/L   Sodium 141 135 - 145 mmol/L   Potassium 2.8 (L) 3.5 - 5.1 mmol/L   Calcium, Ion 1.03 (L) 1.15 - 1.40 mmol/L   HCT 27.0 (L) 36.0 - 46.0 %   Hemoglobin 9.2 (L) 12.0 - 15.0 g/dL   Sample type VENOUS   I-stat chem 8, ED (not at [facility], [facility] or [facility])     Status: Abnormal   Collection Time: 12/20/22  7:47 AM  Result Value Ref Range   Sodium 142 135 - 145 mmol/L   Potassium 2.8 (L) 3.5 - 5.1 mmol/L   Chloride 111 98 - 111 mmol/L   BUN 8 8 - 23 mg/dL   Creatinine, Ser 8.46  0.44 - 1.00 mg/dL   Glucose, Bld 409 (H) 70 - 99 mg/dL    Comment: Glucose reference range applies only to samples taken after fasting for at  least 8 hours.   Calcium, Ion 0.98 (L) 1.15 - 1.40 mmol/L   TCO2 17 (L) 22 - 32 mmol/L   Hemoglobin 9.5 (L) 12.0 - 15.0 g/dL   HCT 81.1 (L) 91.4 - 78.2 %  Magnesium     Status: None   Collection Time: 12/20/22  8:52 AM  Result Value Ref Range   Magnesium 2.0 1.7 - 2.4 mg/dL    Comment: Performed at [facility], [address] [place], [place] [address]  Phosphorus     Status: Abnormal   Collection Time: 12/20/22  8:52 AM  Result Value Ref Range   Phosphorus 1.9 (L) 2.5 - 4.6 mg/dL    Comment: Performed at [facility], [address], [place], [place] [address]  Lithium level     Status: None   Collection Time: 12/20/22  8:52 AM  Result Value Ref Range   Lithium Lvl 0.92 0.60 - 1.20 mmol/L    Comment: Performed at [facility], [address] [place], [place] [address]  Troponin I (High Sensitivity)     Status: None   Collection Time: 12/20/22  8:52 AM  Result Value Ref Range   Troponin I (High Sensitivity) 3 <18 ng/L    Comment: (NOTE) Elevated high sensitivity troponin I (hsTnI) values and significant  changes across serial measurements may suggest ACS but many other  chronic and acute conditions are known to elevate hsTnI results.  Refer to the "Links" section for chest pain algorithms and additional  guidance. Performed at [facility], [address], [place], [place] [address]   T4, free     Status: Abnormal   Collection Time: 12/20/22  8:52 AM  Result Value Ref Range   Free T4 <0.25 (L) 0.61 - 1.12 ng/dL    Comment: (NOTE) Biotin ingestion may interfere with free T4 tests. If the results are inconsistent with the TSH level, previous test results, or the clinical presentation, then consider biotin interference. If needed, order repeat testing after stopping biotin. Performed at [facility], [address], [place], [place] [address]   Cortisol     Status: None   Collection Time: 12/20/22  8:52 AM  Result Value Ref Range    Cortisol, Plasma 27.6 ug/dL    Comment: (NOTE) AM    6.7 - 22.6 ug/dL PM   <44.0       ug/dL Performed at [facility], [address] [place], [place] [address]    DG Chest Port 1 View  Result Date: 12/20/2022 CLINICAL DATA:  Nausea, vomiting and spurring. EXAM: PORTABLE CHEST 1 VIEW COMPARISON:  PA Lat 07/03/2015 FINDINGS: Interval low inspiration on exam. Bronchovascular crowding particularly in the bases. Basilar infiltrate is not excluded. The remaining lungs are clear. The sulci are sharp. Heart size and vasculature are normal for expiration. The mediastinum is stable with mild aortic atherosclerosis. Multiple overlying monitor wires.  No new osseous findings. IMPRESSION: Interval low inspiration with bronchovascular crowding. Basilar infiltrate is not excluded. No other evidence of acute chest findings. Follow-up PA and lateral with full inspiration is recommended. Electronically Signed   By: Almira Bar M.D.   On: 12/20/2022 07:36    Pending Labs [employer] (From admission, onward)     Start  Ordered   12/21/22 0500  CBC  Tomorrow morning,   R        12/20/22 1008   12/21/22 0500  Basic metabolic panel  Tomorrow morning,   R        12/20/22 1008   12/21/22 0500  TSH  Tomorrow morning,   R        12/20/22 1034   12/21/22 0500  T4, free  Tomorrow morning,   R        12/20/22 1034   12/20/22 1009  Urinalysis, Routine w reflex microscopic -Urine, Catheterized  Once,   R       Question:  Specimen Source  Answer:  Urine, Catheterized   12/20/22 1008   12/20/22 1008  Culture, blood (Routine X 2) w Reflex to ID Panel  BLOOD CULTURE X 2,   R (with TIMED occurrences)      12/20/22 1008   12/20/22 1006  HIV Antibody (routine testing w rflx)  (HIV Antibody (Routine testing w reflex) panel)  Once,   R        12/20/22 1008   12/20/22 1006  CBC  (heparin)  Once,   R       Comments: Baseline for heparin therapy IF NOT ALREADY DRAWN.  Notify MD if PLT < 100 K.    12/20/22  1008   12/20/22 1006  Creatinine, serum  (heparin)  Once,   R       Comments: Baseline for heparin therapy IF NOT ALREADY DRAWN.    12/20/22 1008   12/20/22 0843  T3, free  Once,   URGENT        12/20/22 0842            Vitals/Pain Today's Vitals   12/20/22 1015 12/20/22 1020 12/20/22 1040 12/20/22 1050  BP: 94/67 (!) 84/61 (!) 80/60 (!) 84/58  Pulse: 60 (!) 56 (!) 56 (!) 55  Resp: 16 11 12 11   Temp:   (!) 92.5 F (33.6 C)   TempSrc:   Rectal   SpO2: 98% 94% 95% 94%  Weight:      Height:      PainSc:        Isolation Precautions No active isolations  Medications Medications  0.9 %  sodium chloride infusion (250 mLs Intravenous New Bag/Given 12/20/22 0909)  norepinephrine (LEVOPHED) 4mg  in (0.016 mg/mL) premix infusion (5 mcg/min Intravenous Rate/Dose Change 12/20/22 0956)  levothyroxine (SYNTHROID, LEVOTHROID) injection 200 mcg (has no administration in time range)  levothyroxine (SYNTHROID, LEVOTHROID) injection 100 mcg (has no administration in time range)  lamoTRIgine (LAMICTAL) tablet 200 mg (has no administration in time range)  docusate sodium (COLACE) capsule 100 mg (has no administration in time range)  polyethylene glycol (MIRALAX / GLYCOLAX) packet 17 g (has no administration in time range)  heparin injection 5,000 Units (has no administration in time range)  dextrose 5 % in lactated ringers infusion (has no administration in time range)  piperacillin-tazobactam (ZOSYN) IVPB 3.375 g (has no administration in time range)  vancomycin (VANCOCIN) IVPB 1000 mg/200 mL premix (has no administration in time range)  hydrocortisone sodium succinate (SOLU-CORTEF) 100 MG injection 100 mg (has no administration in time range)  pantoprazole (PROTONIX) injection 40 mg (has no administration in time range)  prochlorperazine (COMPAZINE) injection 10 mg (10 mg Intravenous Given 12/20/22 0704)  diphenhydrAMINE (BENADRYL) injection 25 mg (25 mg Intravenous Given 12/20/22 0707)   sodium chloride 0.9 % bolus 1,000 mL (0 mLs Intravenous Stopped 12/20/22  1610)  atropine 1 MG/10ML injection 1 mg (1 mg Intravenous Given 12/20/22 0730)  atropine 1 MG/10ML injection 1 mg (1 mg Intravenous Given 12/20/22 0737)  potassium chloride 10 mEq in 100 mL IVPB (0 mEq Intravenous Stopped 12/20/22 1028)    Mobility walks     Focused Assessments Cardiac Assessment Handoff:  Cardiac Rhythm: (S) Sinus bradycardia (s/p atropine) No results found for: "CKTOTAL", "CKMB", "CKMBINDEX", "TROPONINI" No results found for: "DDIMER" Does the Patient currently have chest pain? No    R Recommendations: See Admitting Provider Note  Report given to:   Additional Notes: norepi

## 2022-12-20 NOTE — Consult Note (Signed)
ELECTROPHYSIOLOGY CONSULT NOTE    Patient ID: Rachel Everett MRN: [id number], DOB/AGE: 07-22-1960 62 y.o.  Admit date: 12/20/2022 Date of Consult: 12/20/2022  Primary Physician: Claiborne Rigg, NP Primary Cardiologist: None  Electrophysiologist: None  Referring Provider: Dr. Rhunette Croft  HPI: Rachel Everett is a 62 y.o. female with a history of hypothyroidism, GERD and bipolar disorder on lithium who is being seen today for the evaluation of bradycardia at the request of Dr. Rhunette Croft.  Patient was reportedly doing relatively well until about 2 weeks ago when she developed worsening lethargy, dizziness lightheadedness, nausea and vomiting.  Things appeared to worsen over the past 2 weeks, culminating in last night her calling out for help to her family member in the middle of the night.  She then had vomiting for approximately 3 hours.  She was brought to the ED by her family member.  In the ED she was found to be bradycardic with heart rates in the 40s to 50s, for which electrophysiology has been consulted.  During exam, patient is frequently falling asleep.  Much of history was gathered from her family.  Of note, she has been off medications for some time due to cost.  She only restarted some of her medications in the past couple of days.  Labs Potassium2.8* (10/05 2130) Magnesium  2.0 (10/05 8657) Creatinine, ser  1.12* (10/05 1229) PLT  227 (10/05 1229) HGB  11.3* (10/05 1229) WBC 12.8* (10/05 1229) Troponin I (High Sensitivity)3 (10/05 0852).    Past Medical History:  Diagnosis Date   Anxiety    Bipolar 1 disorder (HCC)    Depression    GERD (gastroesophageal reflux disease)    Hyperlipidemia    SVD (spontaneous vaginal delivery)    x 1   Thyroid disease      Surgical History:  Past Surgical History:  Procedure Laterality Date   ANKLE SURGERY     right    ANTERIOR CRUCIATE LIGAMENT REPAIR Left    BACK SURGERY     L5-S1   CHOLECYSTECTOMY     FRACTURE SURGERY Left     elbow   TENDON REPAIR Right    angle   TONSILLECTOMY       Medications Prior to Admission  Medication Sig Dispense Refill Last Dose   atorvastatin (LIPITOR) 20 MG tablet Take 1 tablet (20 mg total) by mouth daily. 30 tablet 0 12/19/2022   hydrOXYzine (ATARAX) 50 MG tablet Take 50 mg by mouth 2 (two) times daily.   12/19/2022   lamoTRIgine (LAMICTAL) 200 MG tablet Take 200 mg by mouth 2 (two) times daily.   12/19/2022   levothyroxine (SYNTHROID) 137 MCG tablet TAKE 1 TABLET (137 MCG TOTAL) BY MOUTH DAILY. 90 tablet 0 12/19/2022   lithium carbonate (LITHOBID) 300 MG CR tablet Take 300 mg by mouth 3 (three) times daily.   0 12/19/2022   propranolol (INDERAL) 10 MG tablet    12/19/2022   QUEtiapine (SEROQUEL XR) 50 MG TB24 24 hr tablet Take 50 mg by mouth daily as needed.   12/19/2022   Vitamin D, Ergocalciferol, (DRISDOL) 1.25 MG (50000 UNIT) CAPS capsule Take 50,000 Units by mouth every 7 (seven) days.   unknown    Inpatient Medications:   Chlorhexidine Gluconate Cloth  6 each Topical Daily   heparin  5,000 Units Subcutaneous Q8H   hydrocortisone sodium succinate  100 mg Intravenous Q8H   lamoTRIgine  200 mg Oral BID   [START ON 12/21/2022] levothyroxine  100 mcg Intravenous Daily  pantoprazole (PROTONIX) IV  40 mg Intravenous Q12H    Allergies: No Known Allergies  Family History  Problem Relation Age of Onset   Cancer Mother    Cancer Maternal Grandmother    Colon cancer Neg Hx    Esophageal cancer Neg Hx    Rectal cancer Neg Hx    Stomach cancer Neg Hx      Physical Exam: Vitals:   12/20/22 1230 12/20/22 1300 12/20/22 1330 12/20/22 1400  BP: 101/73 97/72 101/78 103/82  Pulse: (!) 54 (!) 56 63 65  Resp: 12 13 20 14   Temp:   97.9 F (36.6 C)   TempSrc:   Oral   SpO2: 98% 99% 97% 96%  Weight:      Height:        GEN- NAD, drowsy but arousable HEENT: Normocephalic, atraumatic Lungs: CTAB, Normal effort.  Heart: Bradycardic, regular rhythm.  GI: Soft, NT, ND.     Radiology/Studies: CT ABDOMEN PELVIS W CONTRAST  Result Date: 12/20/2022 CLINICAL DATA:  62 year old female with history of hypotension. Suspected bowel obstruction. EXAM: CT ABDOMEN AND PELVIS WITH CONTRAST TECHNIQUE: Multidetector CT imaging of the abdomen and pelvis was performed using the standard protocol following bolus administration of intravenous contrast. RADIATION DOSE REDUCTION: This exam was performed according to the departmental dose-optimization program which includes automated exposure control, adjustment of the mA and/or kV according to patient size and/or use of iterative reconstruction technique. CONTRAST:  75mL OMNIPAQUE IOHEXOL 350 MG/ML SOLN COMPARISON:  No priors. FINDINGS: Lower chest: Dependent areas of subsegmental atelectasis are noted in the lower lobes of the lungs bilaterally. Hepatobiliary: Heterogeneous low attenuation noted throughout the hepatic parenchyma, likely to reflect heterogeneous hepatic steatosis. More focal low-attenuation adjacent to the falciform ligament in the left lobe of the liver likely reflects a benign perfusion anomaly. No suspicious cystic or solid hepatic lesions. No intra or extrahepatic biliary ductal dilatation. Status post cholecystectomy. Pancreas: No pancreatic mass. No pancreatic ductal dilatation. No pancreatic or peripancreatic fluid collections or inflammatory changes. Spleen: Unremarkable. Adrenals/Urinary Tract: Bilateral kidneys and adrenal glands are normal in appearance. No hydroureteronephrosis. Urinary bladder is normal in appearance. Stomach/Bowel: Stomach is completely decompressed but otherwise unremarkable in appearance. No pathologic dilatation of small bowel or colon. Subjective mural thickening and mucosal hyperenhancement in the cecum ascending colon and proximal transverse colon which are relatively decompressed (the apparent bowel wall thickening in this area could be related to underdistention). The appendix is not confidently  identified and may be surgically absent. Regardless, there are no inflammatory changes noted adjacent to the cecum to suggest the presence of an acute appendicitis at this time. Vascular/Lymphatic: Atherosclerosis in the abdominal aorta and pelvic vasculature. No lymphadenopathy noted in the abdomen or pelvis. Reproductive: Uterus and ovaries are unremarkable in appearance. Other: No significant volume of ascites.  No pneumoperitoneum. Musculoskeletal: There are no aggressive appearing lytic or blastic lesions noted in the visualized portions of the skeleton. IMPRESSION: 1. No findings to suggest bowel obstruction. 2. There is some subjective wall thickening and mucosal hyperenhancement involving the cecum, ascending colon and proximal transverse colon. These regions are relatively decompressed, which could result in apparent mural thickening. However, the possibility of colitis is not excluded. As well, the mural thickening is most pronounced in the region of the cecum and proximal ascending colon such that an infiltrative colonic mass is not entirely excluded. Further clinical evaluation is recommended. 3. Probable heterogeneous hepatic steatosis. 4. Aortic atherosclerosis. 5. Additional incidental findings, as above. Electronically Signed  By: Trudie Reed M.D.   On: 12/20/2022 12:12   DG Chest Port 1 View  Result Date: 12/20/2022 CLINICAL DATA:  Nausea, vomiting and spurring. EXAM: PORTABLE CHEST 1 VIEW COMPARISON:  PA Lat 07/03/2015 FINDINGS: Interval low inspiration on exam. Bronchovascular crowding particularly in the bases. Basilar infiltrate is not excluded. The remaining lungs are clear. The sulci are sharp. Heart size and vasculature are normal for expiration. The mediastinum is stable with mild aortic atherosclerosis. Multiple overlying monitor wires.  No new osseous findings. IMPRESSION: Interval low inspiration with bronchovascular crowding. Basilar infiltrate is not excluded. No other  evidence of acute chest findings. Follow-up PA and lateral with full inspiration is recommended. Electronically Signed   By: Almira Bar M.D.   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 (personally reviewed)    Assessment: Rachel Everett is a 62 y.o. female with a history of hypothyroidism, GERD and bipolar disorder on lithium who is being seen today for the evaluation of bradycardia at the request of Dr. Rhunette Croft.  Patient had been off Synthroid for some time.  She presented to the ED healing generally unwell for several weeks.  In the ED she was found to be hypotensive, hypothermic, and bradycardic.  Her presentation is most consistent with myxedema.  Her first EKG upon presentation does look like an atrial rhythm with complete heart block and junctional bradycardia, although the P wave morphology is very unusual and not consistent with sinus activity.  Subsequent EKGs have so much baseline artifact - likely due to shivering and Bair hugger - that it is impossible to determine if there is atrial activity.  On both EKGs she has a regular narrow complex rhythm that is likely junctional in origin. Her bradycardia is likely reversible.  In addition to her profound hypothyroidism, hypokalemia, hypocalcemia, hypothermia and medications, lithium and propranolol (if actually taking), are all likely contributing to the bradycardia. She also has hypotension, which could be secondary to the bradycardia. Her EKGs show low voltages and in the setting of hypotension and myxedema, I would want to exclude pericardial effusion as a contributing factor.  Problem List:  Bradycardia Myxedema, hypothroidism Hypothermia AKI Hypokalemia Hypocalcemia GERD  Recommendations: -She has a stable junctional rhythm currently.  Given her hypotension in the setting of bradycardia, recommend trial IV dopamine or IV epinephrine infusions to increase both heart rate and  blood pressure. -Given the likely reversible nature, severity of her thyroid disease and other acute medical problems, would not commit to pursuing a permanent pacemaker at this time.  We will continue to monitor her on telemetry for signs of improvement in her heart rates and stable AV conduction with treatment of her thyroid disease. If she were to decompensate further then temporary pacemaker would be would be reasonable. -Aggressive IV potassium and IV calcium repletion. -Warm the patient until euthermic. -Hold propranolol. -Echocardiogram to assess LV function and for pericardial effusion.   For questions or updates, please contact [facility] Please consult [email or web address] for contact info under Cardiology/STEMI.  Signed, Nobie Putnam, MD  12/20/2022 2:10 PM

## 2022-12-20 NOTE — ED Notes (Signed)
Cards at bedside

## 2022-12-20 NOTE — Progress Notes (Signed)
Much more awake Hemodynamics improved HR 60s Plan Cont current rx Adding diet Echo per d/w cards

## 2022-12-21 ENCOUNTER — Inpatient Hospital Stay (HOSPITAL_COMMUNITY): Payer: Medicare HMO

## 2022-12-21 DIAGNOSIS — R0609 Other forms of dyspnea: Secondary | ICD-10-CM | POA: Diagnosis not present

## 2022-12-21 DIAGNOSIS — I442 Atrioventricular block, complete: Secondary | ICD-10-CM

## 2022-12-21 DIAGNOSIS — E039 Hypothyroidism, unspecified: Secondary | ICD-10-CM | POA: Diagnosis not present

## 2022-12-21 DIAGNOSIS — E035 Myxedema coma: Secondary | ICD-10-CM | POA: Diagnosis not present

## 2022-12-21 LAB — BASIC METABOLIC PANEL
Anion gap: 16 — ABNORMAL HIGH (ref 5–15)
Anion gap: 13 (ref 5–15)
BUN: <5 mg/dL — ABNORMAL LOW (ref 8–23)
BUN: 5 mg/dL — ABNORMAL LOW (ref 8–23)
CO2: 18 mmol/L — ABNORMAL LOW (ref 22–32)
CO2: 18 mmol/L — ABNORMAL LOW (ref 22–32)
Calcium: 8.9 mg/dL (ref 8.9–10.3)
Calcium: 9 mg/dL (ref 8.9–10.3)
Chloride: 106 mmol/L (ref 98–111)
Chloride: 107 mmol/L (ref 98–111)
Creatinine, Ser: 1.14 mg/dL — ABNORMAL HIGH (ref 0.44–1.00)
Creatinine, Ser: 1.24 mg/dL — ABNORMAL HIGH (ref 0.44–1.00)
GFR, Estimated: 54 mL/min — ABNORMAL LOW (ref >60)
GFR, Estimated: 49 mL/min — ABNORMAL LOW (ref >60)
Glucose, Bld: 148 mg/dL — ABNORMAL HIGH (ref 70–99)
Glucose, Bld: 152 mg/dL — ABNORMAL HIGH (ref 70–99)
Potassium: 3.7 mmol/L (ref 3.5–5.1)
Potassium: 3.8 mmol/L (ref 3.5–5.1)
Sodium: 140 mmol/L (ref 135–145)
Sodium: 138 mmol/L (ref 135–145)

## 2022-12-21 LAB — ECHOCARDIOGRAM COMPLETE
AR max vel: 2.24 cm2
AV Area VTI: 2.22 cm2
AV Area mean vel: 2.14 cm2
AV Mean grad: 2 mm[Hg]
AV Peak grad: 3.4 mm[Hg]
Ao pk vel: 0.92 m/s
Area-P 1/2: 3.77 cm2
Calc EF: 69.5 %
Height: 66 in
S' Lateral: 2.5 cm
Single Plane A2C EF: 71.8 %
Single Plane A4C EF: 70.8 %
Weight: 2574.97 [oz_av]

## 2022-12-21 LAB — CBC
HCT: 33.6 % — ABNORMAL LOW (ref 36.0–46.0)
Hemoglobin: 11.2 g/dL — ABNORMAL LOW (ref 12.0–15.0)
MCH: 31.9 pg (ref 26.0–34.0)
MCHC: 33.3 g/dL (ref 30.0–36.0)
MCV: 95.7 fL (ref 80.0–100.0)
Platelets: 227 10*3/uL (ref 150–400)
RBC: 3.51 MIL/uL — ABNORMAL LOW (ref 3.87–5.11)
RDW: 14.4 % (ref 11.5–15.5)
WBC: 10.7 10*3/uL — ABNORMAL HIGH (ref 4.0–10.5)
nRBC: 0 % (ref 0.0–0.2)

## 2022-12-21 LAB — PHOSPHORUS: Phosphorus: 3.1 mg/dL (ref 2.5–4.6)

## 2022-12-21 LAB — TSH: TSH: 48.919 u[IU]/mL — ABNORMAL HIGH (ref 0.350–4.500)

## 2022-12-21 LAB — T4, FREE: Free T4: 0.42 ng/dL — ABNORMAL LOW (ref 0.61–1.12)

## 2022-12-21 LAB — MAGNESIUM: Magnesium: 2 mg/dL (ref 1.7–2.4)

## 2022-12-21 MED ORDER — PERFLUTREN LIPID MICROSPHERE
1.0000 mL | INTRAVENOUS | Status: AC | PRN
  Administered 2022-12-21: 2 mL via INTRAVENOUS

## 2022-12-21 MED ORDER — CEPHALEXIN 500 MG PO CAPS
500.0000 mg | ORAL_CAPSULE | Freq: Two times a day (BID) | ORAL | Status: DC
  Administered 2022-12-21 – 2022-12-22 (×3): 500 mg via ORAL
  Filled 2022-12-21 (×5): qty 1

## 2022-12-21 MED ORDER — CEPHALEXIN 500 MG PO CAPS
500.0000 mg | ORAL_CAPSULE | Freq: Three times a day (TID) | ORAL | Status: DC
  Filled 2022-12-21: qty 1

## 2022-12-21 MED ORDER — MELATONIN 5 MG PO TABS
5.0000 mg | ORAL_TABLET | Freq: Every evening | ORAL | Status: DC | PRN
  Administered 2022-12-21 – 2022-12-22 (×2): 5 mg via ORAL
  Filled 2022-12-21 (×2): qty 1

## 2022-12-21 MED ORDER — POTASSIUM CHLORIDE CRYS ER 20 MEQ PO TBCR
40.0000 meq | EXTENDED_RELEASE_TABLET | Freq: Once | ORAL | Status: AC
  Administered 2022-12-21: 40 meq via ORAL
  Filled 2022-12-21: qty 2

## 2022-12-21 MED ORDER — SODIUM CHLORIDE 0.9 % IV SOLN
12.5000 mg | Freq: Four times a day (QID) | INTRAVENOUS | Status: DC | PRN
  Administered 2022-12-21 – 2022-12-26 (×3): 12.5 mg via INTRAVENOUS
  Filled 2022-12-21 (×2): qty 0.5
  Filled 2022-12-21 (×2): qty 12.5

## 2022-12-21 MED ORDER — CEPHALEXIN 500 MG PO CAPS
500.0000 mg | ORAL_CAPSULE | Freq: Two times a day (BID) | ORAL | Status: DC
  Filled 2022-12-21: qty 1

## 2022-12-21 NOTE — Progress Notes (Signed)
NAME:  Rachel Everett, MRN:  [id number], DOB:  September 01, 1960, LOS: 1 ADMISSION DATE:  12/20/2022, CONSULTATION DATE:  10/5 REFERRING MD:  Shyrl Numbers, CHIEF COMPLAINT:  bradycardia and shock    History of Present Illness:  62 year old female patient with multiple medical comorbidities as listed below.  History primarily provided by her her daughter [name].  Brought to the emergency room on 10/5With patient reporting nausea and vomiting, and weakness.  Patient has been off medications for at least a month if not more specifically not taking Synthroid.  Had also missed at least 4 days of her Lamictal and lithium.  However onset of deterioration noted for over about a month's time now, specifically daughter noted decreased activity, always feeling cold and wanting blankets, poor appetite, and more recently difficulty walking and having trouble with her balance.  As noted the nausea and vomiting had also gotten worse which was the final driving factor to bring her to the emergency room.  On arrival she was found to be bradycardic with heart rate in the 40s to 50s, hypotensive with systolic blood pressure in the 70s, hypothermic with temperature of 92.3.  She was lethargic but oriented x 3 initial hemoglobin 9.5 white blood cell count normal at 8.9 had a nonanion gap metabolic acidosis, and mild AKI initial TSH was 67.015 initial venous blood gas pH 7.33 pCO2 of 33 pO2 of 48 HCO3 17.5.  She was administered IV fluids, cultures were sent, cortisol sent, free T and free T4 sent, stress dose steroids initiated, and because of her ongoing hypotension norepinephrine infusion was started and critical care asked to admit. Pertinent  Medical History  Hypothyroidism, bipolar disease, PTSD, agoraphobia, Asperger HL, GERD  Significant Hospital Events: Including procedures, antibiotic start and stop dates in addition to other pertinent events   10/5 admitted, lethargic, heart rate in the 40s to 50s, hypotensive requiring  norepinephrine.  TSH 67.015, started on norepinephrine, stress dose steroids, loaded with 200 of levothyroxine with plan to continue 100 IV daily admitted to ICU  Interim History / Subjective:  Hypothermia resolved overnight.  Remains on norepinephrine infusion at low-dose.  She feels cold but otherwise denies complaints.  Objective   Blood pressure 96/68, pulse 62, temperature 98.1 F (36.7 C), temperature source Axillary, resp. rate 16, height 5\' 6"  (1.676 m), weight 73 kg, SpO2 93%.        Intake/Output Summary (Last 24 hours) at 12/21/2022 0939 Last data filed at 12/21/2022 0900 Gross per 24 hour  Intake 3244.64 ml  Output 1560 ml  Net 1684.64 ml   Filed Weights   12/20/22 0702 12/20/22 1143  Weight: 94.4 kg 73 kg    Examination: General: Frail, chronically ill-appearing woman lying in bed no acute distress HENT: [place]/AT, eyes anicteric Lungs: Breathing comfortably on room air, CTAB. Cardiac: S1-S2, regular rate and rhythm, normal sinus rhythm on telemetry. Abdomen mild abdominal tenderness Extremities: Mild pretibial edema, no cyanosis Neuro awake and alert, answering questions appropriately.  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  Myxedema coma w/ hypothermia  Has not taking her medications for several  weeks.  Has a history of noncompliance.  Bradycardic, hypothermic ,hypotensive, lethargic TSH almost 70. -Received loading dose of levothyroxine, continue 100 mcg daily - Follow serial TSH and free T4 - Continue stress dose steroids, although normal baseline cortisol level.  Can hopefully de-escalate this in the coming days.  Acute metabolic encephalopathy, improving -Hold PTA lithium today.  Restarted Lamictal. - PT, OT - Reorientation. - Synthroid  AKI, improving - Can orally rehydrate, discontinue IV fluids - Continue to monitor - Renally dose meds and avoid nephrotoxic meds.  Hypokalemia, resolved - Continue to monitor  Hypophosphatemia, resolved - Monitor  N&V -Antiemetics as needed  GERD -Continue PPI  UTI -keflex x 3 days, add on urine culture  Patient updated during rounds. Stable to transfer to med tele. TRH to assume care tomorrow.    Best Practice (right click and "Reselect all SmartList Selections" daily)   Diet/type: Regular consistency (see orders) DVT prophylaxis: prophylactic heparin  GI prophylaxis: PPI Lines: N/A Foley:  removal ordered  Code Status:  full code Last date of multidisciplinary goals of care discussion [pending]  Labs   CBC: Recent Labs  Lab 12/20/22 0728 12/20/22 0746 12/20/22 0747 12/20/22 1229 12/21/22 0300  WBC 8.9  --   --  12.8* 10.7*  HGB 9.7* 9.2* 9.5* 11.3* 11.2*  HCT 30.6* 27.0* 28.0* 35.4* 33.6*  MCV 101.0*  --   --  98.6 95.7  PLT 158  --   --  227 227    Basic Metabolic Panel: Recent Labs  Lab 12/20/22 0728 12/20/22 0746 12/20/22 0747 12/20/22 0852 12/20/22 1229 12/21/22 0300  NA 139 141 142  --   --  140  K 3.0* 2.8* 2.8*  --   --  3.7  CL 111  --  111  --   --  106  CO2 18*  --   --   --   --  18*  GLUCOSE 121*  --  115*  --   --  148*  BUN 9  --  8  --   --  <5*  CREATININE 1.14*  --  1.00  --  1.12* 1.14*  CALCIUM 7.3*  --   --   --   --  8.9  MG  --   --   --  2.0  --  2.0  PHOS  --   --    --  1.9*  --  3.1   GFR: Estimated Creatinine Clearance: 52.3 mL/min (A) (by C-G formula based on SCr of 1.14 mg/dL (H)). Recent Labs  Lab 12/20/22 0728 12/20/22 1229 12/21/22 0300  WBC 8.9 12.8* 10.7*     Critical care time:        Steffanie Dunn, DO 12/21/22 10:16 AM [facility]  For contact information, see Amion. If no response to pager, please call PCCM consult pager. After hours, 7PM- 7AM, please call Elink.

## 2022-12-21 NOTE — Progress Notes (Signed)
  Echocardiogram 2D Echocardiogram has been performed.  Rahmel Nedved N Vennie Salsbury,RDCS 12/21/2022, 9:35 AM

## 2022-12-21 NOTE — Consult Note (Signed)
Patient Name: Rachel Everett Date of Encounter: 12/22/2022 [facility] Cardiologist: None   Interval Summary  .    Transferred out of MICU to [facility]. Feels better but still with nausea and abdominal discomfort.  Vital Signs .    Vitals:   12/21/22 2044 12/22/22 0401 12/22/22 0610 12/22/22 0841  BP: 106/74 (!) 84/56  102/70  Pulse: 66 (!) 58  61  Resp: 18 18  18   Temp: 98 F (36.7 C) 98.3 F (36.8 C)  98 F (36.7 C)  TempSrc: Oral Oral  Oral  SpO2: 92% 93%  91%  Weight:   76 kg   Height:        Intake/Output Summary (Last 24 hours) at 12/22/2022 0904 Last data filed at 12/21/2022 1129 Gross per 24 hour  Intake 213.63 ml  Output 700 ml  Net -486.37 ml      12/22/2022    6:10 AM 12/20/2022   11:43 AM 12/20/2022    7:02 AM  Last 3 Weights  Weight (lbs) 167 lb 8.8 oz 160 lb 15 oz 208 lb 1.8 oz  Weight (kg) 76 kg 73 kg 94.4 kg      Telemetry/ECG    Sinus rhythm - Personally Reviewed  Physical Exam .   GEN: No acute distress.   Neck: No JVD Cardiac: Normal rate and regular rhythm.  Respiratory: Clear to auscultation bilaterally. GI: Soft, nontender, non-distended  MS: No edema  12/21/22 Echo:   1. Left ventricular ejection fraction, by estimation, is 65 to 70%. The  left ventricle has normal function. The left ventricle has no regional  wall motion abnormalities. Left ventricular diastolic parameters were  normal.   2. Right ventricular systolic function is normal. The right ventricular  size is normal.   3. The mitral valve is normal in structure. Trivial mitral valve  regurgitation. No evidence of mitral stenosis.   4. The aortic valve is tricuspid. Aortic valve regurgitation is not  visualized. No aortic stenosis is present.   5. The inferior vena cava is normal in size with greater than 50%  respiratory variability, suggesting right atrial pressure of 3 mmHg.   Assessment & Plan .   Assessment: Rachel Everett is a 62 y.o. female with a history of  hypothyroidism, GERD and bipolar disorder on lithium who is being seen today for the evaluation of bradycardia at the request of Dr. Rhunette Croft.  Patient had been off Synthroid for some time.  She presented to the ED healing generally unwell for several weeks.  In the ED she was found to be hypotensive, hypothermic, and bradycardic.  Her presentation is most consistent with myxedema.  Her first EKG upon presentation does look like an atrial rhythm with complete heart block and junctional bradycardia, although the P wave morphology is very unusual and not consistent with sinus activity.  Subsequent EKGs have so much baseline artifact - likely due to shivering and Bair hugger - that it is impossible to determine if there is atrial activity.  On both EKGs she has a regular narrow complex rhythm that is likely junctional in origin. Her bradycardia is likely reversible.  In addition to her profound hypothyroidism, hypokalemia, hypocalcemia, hypothermia and medications, lithium and propranolol (if actually taking), are all likely contributing to the bradycardia. She also has hypotension, which could be secondary to the bradycardia. Her EKGs show low voltages and in the setting of hypotension and myxedema, I would want to exclude pericardial effusion as a contributing factor.  Now in normal sinus rhythm with 1:1 AV conduction. Echocardiogram was normal.    Problem List:  Bradycardia Myxedema, hypothroidism Hypothermia AKI Hypokalemia Hypocalcemia GERD   Recommendations: -We will continue to monitor her on telemetry. No indication for pacing at this time. -12 lead EKG ordered to document recovery of her sinus rate and show 1:1 AV conduction.  -Monitor electrolytes and replete accordingly. -Hold propranolol.    For questions or updates, please contact [facility] Please consult [email or web address] for contact info under     Signed, Nobie Putnam, MD

## 2022-12-21 NOTE — Progress Notes (Signed)
[facility] ADULT ICU REPLACEMENT PROTOCOL   The patient does apply for the [facility] Adult ICU Electrolyte Replacment Protocol based on the criteria listed below:   1.Exclusion criteria: TCTS, ECMO, Dialysis, and Myasthenia Gravis patients 2. Is GFR >/= 30 ml/min? Yes.    Patient's GFR today is 54 3. Is SCr </= 2? Yes.   Patient's SCr is 1.14 mg/dL 4. Did SCr increase >/= 0.5 in 24 hours? No. 5.Pt's weight >40kg  Yes.   6. Abnormal electrolyte(s): K  7. Electrolytes replaced per protocol 8.  Call MD STAT for K+ </= 2.5, Phos </= 1, or Mag </= 1 Physician:  Thersa Salt Gabrelle Roca 12/21/2022 5:15 AM

## 2022-12-21 NOTE — Plan of Care (Signed)
Pt alert and oriented x4.Pt medicated for c/o nausea with zofran with relief. Received an order for pt's home med of Vistaril per request of pt, assist with sleep, two hours later pt is still awake, N.O. received for Melatonin for sleep with effect.Continues on MIVF of D5LR , Levo gtt and IV antibiotics. Appetite poor, consuming bites of food, good uop. Will continue to monitor, pt stable at this time.

## 2022-12-22 DIAGNOSIS — E035 Myxedema coma: Secondary | ICD-10-CM

## 2022-12-22 LAB — MAGNESIUM: Magnesium: 2.3 mg/dL (ref 1.7–2.4)

## 2022-12-22 LAB — PHOSPHORUS: Phosphorus: 3.5 mg/dL (ref 2.5–4.6)

## 2022-12-22 LAB — TSH: TSH: 48.856 u[IU]/mL — ABNORMAL HIGH (ref 0.350–4.500)

## 2022-12-22 LAB — URINE CULTURE: Culture: NO GROWTH

## 2022-12-22 LAB — T4, FREE: Free T4: 0.4 ng/dL — ABNORMAL LOW (ref 0.61–1.12)

## 2022-12-22 LAB — T3, FREE: T3, Free: <0.3 pg/mL — ABNORMAL LOW (ref 2.0–4.4)

## 2022-12-22 MED ORDER — LEVOTHYROXINE SODIUM 25 MCG PO TABS
137.0000 ug | ORAL_TABLET | Freq: Every day | ORAL | Status: DC
  Administered 2022-12-22 – 2022-12-23 (×2): 137 ug via ORAL
  Filled 2022-12-22 (×3): qty 1

## 2022-12-22 MED ORDER — HYDROCODONE-ACETAMINOPHEN 5-325 MG PO TABS
1.0000 | ORAL_TABLET | Freq: Four times a day (QID) | ORAL | Status: DC | PRN
  Administered 2022-12-26: 1 via ORAL
  Filled 2022-12-22: qty 1

## 2022-12-22 MED ORDER — NALOXONE HCL 0.4 MG/ML IJ SOLN: 0.4000 mg | INTRAMUSCULAR | Status: DC | PRN

## 2022-12-22 MED ORDER — ACETAMINOPHEN 325 MG PO TABS
650.0000 mg | ORAL_TABLET | Freq: Four times a day (QID) | ORAL | Status: DC | PRN
  Administered 2022-12-22 – 2022-12-25 (×2): 650 mg via ORAL
  Filled 2022-12-22 (×2): qty 2

## 2022-12-22 MED ORDER — PANTOPRAZOLE SODIUM 40 MG PO TBEC
40.0000 mg | DELAYED_RELEASE_TABLET | Freq: Two times a day (BID) | ORAL | Status: DC
  Administered 2022-12-22 – 2022-12-27 (×10): 40 mg via ORAL
  Filled 2022-12-22 (×10): qty 1

## 2022-12-22 MED ORDER — HYDROCORTISONE SOD SUC (PF) 100 MG IJ SOLR
50.0000 mg | Freq: Two times a day (BID) | INTRAMUSCULAR | Status: DC
  Administered 2022-12-23 – 2022-12-26 (×7): 50 mg via INTRAVENOUS
  Filled 2022-12-22 (×8): qty 1

## 2022-12-22 NOTE — Progress Notes (Signed)
PHARMACIST - PHYSICIAN COMMUNICATION  DR:   Lowell Guitar  CONCERNING: IV to Oral Route Change Policy  RECOMMENDATION: This patient is receiving Synthroid and protonix by the intravenous route.  Based on criteria approved by the Pharmacy and Therapeutics Committee, the intravenous medication(s) is/are being converted to the equivalent oral dose form(s).   DESCRIPTION: These criteria include: The patient is eating (either orally or via tube) and/or has been taking other orally administered medications for a least 24 hours The patient has no evidence of active gastrointestinal bleeding or impaired GI absorption (gastrectomy, short bowel, patient on TNA or NPO).  If you have questions about this conversion, please contact the Pharmacy Department  []   [phone] )  [facility] []   [phone] )  [facility] [x]   [phone] )  [facility] []   [phone] )  [facility] []   [phone] )  [facility]

## 2022-12-22 NOTE — Evaluation (Signed)
Occupational Therapy Evaluation Patient Details Name: Rachel Everett MRN: [id number] DOB: 1960/08/13 Today's Date: 12/22/2022   History of Present Illness 62 yo female admitted 10/5 with N/V, generalized weakness. Pt noted to be hypothermic, hypotensive and bradycardic with myxedema. PMHx: bipolar disorder on lithium, hypothyroidism, HLD   Clinical Impression   Pt currently at min assist level overall for simulated toilet transfers, bed mobility, and LB selfcare sit to stand.  BP stable throughout session.  In supine with HOB up 55 degrees it was 100/73.  In sitting it increased to 119/75 and in standing 116/92.  Pt with some increased anxiety and fear of it dropping when sitting up and her getting nauseas, but it remained stable throughout this session.  Pt lives with her daughter who provides some assist with bed mobility, meal prep, home management, and LB dressing at times.  Daughter currently works 3rd shift and assists pt to bed prior to work and helps her OOB in the morning.  She then is able to use her cane and furniture walk throughout the apartment.  Feel pt will benefit from acute care OT at this time in order to progress ADL independence to a level safe for return home.  Recommend HHOT at discharge to continue progression.         If plan is discharge home, recommend the following: A little help with walking and/or transfers;Assistance with cooking/housework;Direct supervision/assist for medications management;Supervision due to cognitive status;A little help with bathing/dressing/bathroom    Functional Status Assessment  Patient has had a recent decline in their functional status and demonstrates the ability to make significant improvements in function in a reasonable and predictable amount of time.  Equipment Recommendations  BSC/3in1       Precautions / Restrictions Precautions Precautions: Fall Restrictions Weight Bearing Restrictions: No      Mobility Bed Mobility Overal  bed mobility: Needs Assistance Bed Mobility: Supine to Sit     Supine to sit: Min assist, HOB elevated          Transfers Overall transfer level: Needs assistance Equipment used: Rolling walker (2 wheels) Transfers: Sit to/from Stand, Bed to chair/wheelchair/BSC Sit to Stand: Min assist     Step pivot transfers: Min assist     General transfer comment: Mod demonstrational cueing for hand placement with sit to stand.      Balance Overall balance assessment: Needs assistance Sitting-balance support: No upper extremity supported, Feet supported Sitting balance-Leahy Scale: Fair     Standing balance support: Bilateral upper extremity supported, During functional activity, Reliant on assistive device for balance Standing balance-Leahy Scale: Poor Standing balance comment: Pt needs UE support in standing                           ADL either performed or assessed with clinical judgement   ADL Overall ADL's : Needs assistance/impaired Eating/Feeding: Independent;Sitting Eating/Feeding Details (indicate cue type and reason): simulated Grooming: Wash/dry hands;Wash/dry face;Set up;Sitting Grooming Details (indicate cue type and reason): simulated Upper Body Bathing: Supervision/ safety;Sitting Upper Body Bathing Details (indicate cue type and reason): simulated Lower Body Bathing: Sit to/from stand;Minimal assistance Lower Body Bathing Details (indicate cue type and reason): simulated Upper Body Dressing : Supervision/safety;Sitting Upper Body Dressing Details (indicate cue type and reason): simulated Lower Body Dressing: Minimal assistance;Sit to/from stand   Toilet Transfer: Minimal assistance;Stand-pivot;Rolling walker (2 wheels) Toilet Transfer Details (indicate cue type and reason): simulated Toileting- Clothing Manipulation and Hygiene: Minimal assistance;Sit to/from stand  Functional mobility during ADLs: Minimal assistance;Rolling walker (2  wheels) General ADL Comments: Pt's daughter present and provides assist for patient at home when needed.  Daughter currently works third shift and helps pt into the bed before she goes to work, and then helps her up when she gets home.  She used a cane or furniture walked in the apartment.  Will benefit from a 3:1 for home and possible tub bench.  BP in supine with HOB elevated to 55 degrees 100/73 and then sitting EOB 119/75, and standing 116/92.  Pt with some reports of dizziness but brief without nystagmus present and no nausea.     Vision Baseline Vision/History: 1 Wears glasses (pt and daughter say the perscription needs to be updated, so she doesn't currently use them much) Ability to See in Adequate Light: 0 Adequate Patient Visual Report: Blurring of vision Vision Assessment?: No apparent visual deficits Additional Comments: Pt reports diplopia at times but not present during this session with 100% accuracy noted identifying fingers in all quadrants.     Perception Perception: Within Functional Limits       Praxis Praxis: WFL       Pertinent Vitals/Pain Pain Assessment Pain Assessment: Faces Faces Pain Scale: Hurts a little bit Pain Location: right hip, back Pain Descriptors / Indicators: Discomfort Pain Intervention(s): Limited activity within patient's tolerance, Monitored during session, Repositioned     Extremity/Trunk Assessment Upper Extremity Assessment Upper Extremity Assessment: Defer to OT evaluation   Lower Extremity Assessment Lower Extremity Assessment: Generalized weakness   Cervical / Trunk Assessment Cervical / Trunk Assessment: Normal;Other exceptions Cervical / Trunk Exceptions: forward head   Communication Communication Communication: No apparent difficulties   Cognition Arousal: Alert Behavior During Therapy: WFL for tasks assessed/performed Overall Cognitive Status: Impaired/Different from baseline Area of Impairment: Memory, Safety/judgement,  Problem solving                     Memory: Decreased short-term memory   Safety/Judgement: Decreased awareness of deficits   Problem Solving: Slow processing, Requires verbal cues, Difficulty sequencing General Comments: Pt able to recall 1/3 words after 3-4 min delay without cueing.  Mod instructional cueing needed to recall the other 2.  Pt reports decreased memory and overall thinking recently.                Home Living Family/patient expects to be discharged to:: Private residence Living Arrangements: Children Available Help at Discharge: Family;Available PRN/intermittently Type of Home: Apartment Home Access: Stairs to enter Entrance Stairs-Number of Steps: 9 total     Two outside intto a hallway without rails and then once inside the hallway 7 steps with rail on the right Entrance Stairs-Rails: Right Home Layout: One level     Bathroom Shower/Tub: Tub/shower unit;Curtain   Firefighter: Standard Bathroom Accessibility: Yes (may have to turn sideways)   Home Equipment: Cane - single point          Prior Functioning/Environment Prior Level of Function : Needs assist             Mobility Comments: Uses cane and furniture walks.  Daughter has to help her out of the bed or off the couch ADLs Comments: Needs assist for LB dressing tasks mostly, occasionally needs assist with UB dressing.        OT Problem List: Decreased strength;Impaired balance (sitting and/or standing);Decreased cognition;Pain;Decreased activity tolerance;Decreased coordination;Decreased knowledge of use of DME or AE;Decreased safety awareness      OT Treatment/Interventions:  Self-care/ADL training;Therapeutic activities;DME and/or AE instruction;Patient/family education;Balance training    OT Goals(Current goals can be found in the care plan section) Acute Rehab OT Goals Patient Stated Goal: To get stronger and get back home OT Goal Formulation: With patient/family Time For  Goal Achievement: 01/05/23 Potential to Achieve Goals: Good  OT Frequency: Min 1X/week       AM-PAC OT "6 Clicks" Daily Activity     Outcome Measure Help from another person eating meals?: None Help from another person taking care of personal grooming?: A Little Help from another person toileting, which includes using toliet, bedpan, or urinal?: A Little Help from another person bathing (including washing, rinsing, drying)?: A Little Help from another person to put on and taking off regular upper body clothing?: A Little Help from another person to put on and taking off regular lower body clothing?: A Little 6 Click Score: 19   End of Session Equipment Utilized During Treatment: Gait belt;Rolling walker (2 wheels) Nurse Communication: Mobility status  Activity Tolerance: Patient limited by fatigue Patient left: in chair;with call bell/phone within reach;with bed alarm set  OT Visit Diagnosis: Unsteadiness on feet (R26.81);Other abnormalities of gait and mobility (R26.89);Repeated falls (R29.6);Other symptoms and signs involving the nervous system (R29.898);Pain Pain - Right/Left: Right Pain - part of body: Hip                Time: 7253-6644 OT Time Calculation (min): 48 min Charges:  OT General Charges $OT Visit: 1 Visit OT Evaluation $OT Eval Moderate Complexity: 1 Mod OT Treatments $Self Care/Home Management : 38-52 mins Perrin Maltese, OTR/L Acute Rehabilitation Services  Office [phone] 12/22/2022

## 2022-12-22 NOTE — Progress Notes (Signed)
TRH night cross cover note:   I was notified by RN of the patient's request for something for pain, with patient reporting generalized discomfort, most prominent in her back.  I subsequently placed orders for as needed acetaminophen as well as as needed Norco for pain not relieved by acetaminophen.   Newton Pigg, DO Hospitalist

## 2022-12-22 NOTE — Evaluation (Signed)
Physical Therapy Evaluation Patient Details Name: Rachel Everett MRN: [id number] DOB: 1960-09-20 Today's Date: 12/22/2022  History of Present Illness  62 yo female admitted 10/5 with N/V, generalized weakness. Pt noted to be hypothermic, hypotensive and bradycardic with myxedema. PMHx: bipolar disorder on lithium, hypothyroidism, HLD  Clinical Impression  Pt pleasant, in chair and anxious about mobility but able to walk limited hall distance with RW. Pt with decreased strength, transfers, balance and function who will benefit from acute therapy to maximize mobility, safety and independence. Encouraged OOB daily with staff and continued HEP. HHPT at D/C recommended.         If plan is discharge home, recommend the following: A little help with walking and/or transfers;Assistance with cooking/housework;Assist for transportation;Help with stairs or ramp for entrance   Can travel by private vehicle        Equipment Recommendations Rolling walker (2 wheels)  Recommendations for Other Services       Functional Status Assessment Patient has had a recent decline in their functional status and demonstrates the ability to make significant improvements in function in a reasonable and predictable amount of time.     Precautions / Restrictions Precautions Precautions: Fall Restrictions Weight Bearing Restrictions: No      Mobility  Bed Mobility               General bed mobility comments: in chair on arrival and end of session    Transfers Overall transfer level: Needs assistance   Transfers: Sit to/from Stand Sit to Stand: Supervision           General transfer comment: cues for hand placement on armrests, use of ball of palm rather than just thenar eminence to push off    Ambulation/Gait Ambulation/Gait assistance: Contact guard assist Gait Distance (Feet): 70 Feet Assistive device: Rolling walker (2 wheels) Gait Pattern/deviations: Step-through pattern, Decreased  stride length   Gait velocity interpretation: <1.8 ft/sec, indicate of risk for recurrent falls   General Gait Details: forward head with neck flexion, cues for looking up, scapular depression, posture and encouragement to maximize distance. Pt walked 20' then 89' reliant on RW  Stairs            Wheelchair Mobility     Tilt Bed    Modified Rankin (Stroke Patients Only)       Balance Overall balance assessment: Needs assistance Sitting-balance support: No upper extremity supported, Feet supported Sitting balance-Leahy Scale: Fair     Standing balance support: Bilateral upper extremity supported, During functional activity, Reliant on assistive device for balance Standing balance-Leahy Scale: Poor Standing balance comment: Rw in standing                             Pertinent Vitals/Pain Pain Assessment Pain Score: 4  Pain Location: legs and thumbs Pain Descriptors / Indicators: Sore Pain Intervention(s): Limited activity within patient's tolerance, Repositioned, Monitored during session    Home Living Family/patient expects to be discharged to:: Private residence Living Arrangements: Children Available Help at Discharge: Family;Available PRN/intermittently Type of Home: Apartment Home Access: Stairs to enter Entrance Stairs-Rails: Right Entrance Stairs-Number of Steps: 9 total     Two outside intto a hallway without rails and then once inside the hallway 7 steps with rail on the right   Home Layout: One level Home Equipment: Cane - single point      Prior Function Prior Level of Function : Needs assist  Mobility Comments: Uses cane and furniture walks.  Daughter has to help her out of the bed or off the couch ADLs Comments: Needs assist for LB dressing tasks mostly, occasionally needs assist with UB dressing.     Extremity/Trunk Assessment   Upper Extremity Assessment Upper Extremity Assessment: Defer to OT evaluation     Lower Extremity Assessment Lower Extremity Assessment: Generalized weakness    Cervical / Trunk Assessment Cervical / Trunk Assessment: Normal;Other exceptions Cervical / Trunk Exceptions: forward head  Communication   Communication Communication: No apparent difficulties  Cognition Arousal: Alert Behavior During Therapy: Flat affect Overall Cognitive Status: Impaired/Different from baseline Area of Impairment: Memory, Safety/judgement, Problem solving                     Memory: Decreased short-term memory   Safety/Judgement: Decreased awareness of deficits   Problem Solving: Slow processing, Requires verbal cues, Difficulty sequencing          General Comments      Exercises General Exercises - Lower Extremity Long Arc Quad: AROM, Both, 10 reps, Seated   Assessment/Plan    PT Assessment Patient needs continued PT services  PT Problem List Decreased strength;Decreased mobility;Decreased activity tolerance;Decreased balance;Decreased knowledge of use of DME       PT Treatment Interventions DME instruction;Gait training;Stair training;Functional mobility training;Therapeutic activities;Patient/family education;Balance training;Therapeutic exercise    PT Goals (Current goals can be found in the Care Plan section)  Acute Rehab PT Goals Patient Stated Goal: return home PT Goal Formulation: With patient Time For Goal Achievement: 01/05/23 Potential to Achieve Goals: Good    Frequency Min 1X/week     Co-evaluation               AM-PAC PT "6 Clicks" Mobility  Outcome Measure Help needed turning from your back to your side while in a flat bed without using bedrails?: A Little Help needed moving from lying on your back to sitting on the side of a flat bed without using bedrails?: A Little Help needed moving to and from a bed to a chair (including a wheelchair)?: A Little Help needed standing up from a chair using your arms (e.g., wheelchair or  bedside chair)?: A Little Help needed to walk in hospital room?: A Little Help needed climbing 3-5 steps with a railing? : A Lot 6 Click Score: 17    End of Session   Activity Tolerance: Patient tolerated treatment well Patient left: in chair;with call bell/phone within reach;with chair alarm set Nurse Communication: Mobility status PT Visit Diagnosis: Other abnormalities of gait and mobility (R26.89);Muscle weakness (generalized) (M62.81)    Time: 7829-5621 PT Time Calculation (min) (ACUTE ONLY): 26 min   Charges:   PT Evaluation $PT Eval Moderate Complexity: 1 Mod PT Treatments $Therapeutic Activity: 8-22 mins PT General Charges $$ ACUTE PT VISIT: 1 Visit         Merryl Hacker, PT Acute Rehabilitation Services Office: [phone]   Jahmani Staup B Kimani Bedoya 12/22/2022, 1:07 PM

## 2022-12-22 NOTE — Progress Notes (Signed)
8.9 9.0  --   MG  --   --   --  2.0  --  2.0  --  2.3  PHOS  --   --   --  1.9*  --  3.1  --  3.5    GFR: Estimated Creatinine Clearance: 49 mL/min (Rachel Everett) (by C-G formula based on SCr of 1.24 mg/dL (H)).  Liver Function Tests: Recent Labs  Lab 12/20/22 0728  AST 24  ALT 21  ALKPHOS 60  BILITOT <0.1*  PROT 4.8*  ALBUMIN 2.8*    CBG: Recent Labs  Lab 12/20/22 1147  GLUCAP 121*     Recent Results (from the past 240 hour(s))  Culture, blood (Routine X 2) w Reflex to ID Panel     Status: None (Preliminary result)   Collection Time: 12/20/22 10:13 AM   Specimen: BLOOD  Result Value Ref Range Status   Specimen Description BLOOD SITE NOT SPECIFIED  Final   Special Requests   Final    BOTTLES DRAWN AEROBIC AND ANAEROBIC Blood Culture adequate volume   Culture   Final    NO GROWTH 2 DAYS Performed at [facility], [address] [place], [place] [address]    Report Status PENDING  Incomplete  MRSA Next Gen by PCR, Nasal     Status: None   Collection Time: 12/20/22 11:45 AM   Specimen: Nasal Mucosa; Nasal Swab  Result Value Ref Range Status   MRSA by PCR Next Gen NOT DETECTED NOT DETECTED Final    Comment: (NOTE) The GeneXpert MRSA Assay (FDA approved for NASAL specimens only), is one component of Rachel Everett comprehensive MRSA colonization surveillance program. It is not intended to diagnose MRSA infection nor to guide or monitor treatment for MRSA infections. Test performance  is not FDA approved in patients less than 69 years old. Performed at [facility], [address] [place], [place] [address]   Culture, blood (Routine X 2) w Reflex to ID Panel     Status: None (Preliminary result)   Collection Time: 12/20/22 12:29 PM   Specimen: BLOOD LEFT ARM  Result Value Ref Range Status   Specimen Description BLOOD LEFT ARM  Final   Special Requests   Final    BOTTLES DRAWN AEROBIC ONLY Blood Culture adequate volume   Culture   Final    NO GROWTH 2 DAYS Performed at [facility], [address] [place], [place] [address]    Report Status PENDING  Incomplete  Urine Culture (for pregnant, neutropenic or urologic patients or patients with an indwelling urinary catheter)     Status: None   Collection Time: 12/21/22  1:37 PM   Specimen: Urine, Clean Catch  Result Value Ref Range Status   Specimen Description URINE, CLEAN CATCH  Final   Special Requests NONE  Final   Culture   Final    NO GROWTH Performed at [facility], [address] [place], [place] [address]    Report Status 12/22/2022 FINAL  Final         Radiology Studies: ECHOCARDIOGRAM COMPLETE  Result Date: 12/21/2022    ECHOCARDIOGRAM REPORT   Patient Name:   Rachel Everett Date of Exam: 12/21/2022 Medical Rec #:  [id number]   Height:       66.0 in Accession #:    [id number]  Weight:       160.9 lb Date of Birth:  Feb 07, 1961  BSA:  8.9 9.0  --   MG  --   --   --  2.0  --  2.0  --  2.3  PHOS  --   --   --  1.9*  --  3.1  --  3.5    GFR: Estimated Creatinine Clearance: 49 mL/min (Rachel Everett) (by C-G formula based on SCr of 1.24 mg/dL (H)).  Liver Function Tests: Recent Labs  Lab 12/20/22 0728  AST 24  ALT 21  ALKPHOS 60  BILITOT <0.1*  PROT 4.8*  ALBUMIN 2.8*    CBG: Recent Labs  Lab 12/20/22 1147  GLUCAP 121*     Recent Results (from the past 240 hour(s))  Culture, blood (Routine X 2) w Reflex to ID Panel     Status: None (Preliminary result)   Collection Time: 12/20/22 10:13 AM   Specimen: BLOOD  Result Value Ref Range Status   Specimen Description BLOOD SITE NOT SPECIFIED  Final   Special Requests   Final    BOTTLES DRAWN AEROBIC AND ANAEROBIC Blood Culture adequate volume   Culture   Final    NO GROWTH 2 DAYS Performed at [facility], [address] [place], [place] [address]    Report Status PENDING  Incomplete  MRSA Next Gen by PCR, Nasal     Status: None   Collection Time: 12/20/22 11:45 AM   Specimen: Nasal Mucosa; Nasal Swab  Result Value Ref Range Status   MRSA by PCR Next Gen NOT DETECTED NOT DETECTED Final    Comment: (NOTE) The GeneXpert MRSA Assay (FDA approved for NASAL specimens only), is one component of Rachel Everett comprehensive MRSA colonization surveillance program. It is not intended to diagnose MRSA infection nor to guide or monitor treatment for MRSA infections. Test performance  is not FDA approved in patients less than 69 years old. Performed at [facility], [address] [place], [place] [address]   Culture, blood (Routine X 2) w Reflex to ID Panel     Status: None (Preliminary result)   Collection Time: 12/20/22 12:29 PM   Specimen: BLOOD LEFT ARM  Result Value Ref Range Status   Specimen Description BLOOD LEFT ARM  Final   Special Requests   Final    BOTTLES DRAWN AEROBIC ONLY Blood Culture adequate volume   Culture   Final    NO GROWTH 2 DAYS Performed at [facility], [address] [place], [place] [address]    Report Status PENDING  Incomplete  Urine Culture (for pregnant, neutropenic or urologic patients or patients with an indwelling urinary catheter)     Status: None   Collection Time: 12/21/22  1:37 PM   Specimen: Urine, Clean Catch  Result Value Ref Range Status   Specimen Description URINE, CLEAN CATCH  Final   Special Requests NONE  Final   Culture   Final    NO GROWTH Performed at [facility], [address] [place], [place] [address]    Report Status 12/22/2022 FINAL  Final         Radiology Studies: ECHOCARDIOGRAM COMPLETE  Result Date: 12/21/2022    ECHOCARDIOGRAM REPORT   Patient Name:   Rachel Everett Date of Exam: 12/21/2022 Medical Rec #:  [id number]   Height:       66.0 in Accession #:    [id number]  Weight:       160.9 lb Date of Birth:  Feb 07, 1961  BSA:  PROGRESS NOTE    Rachel Everett  WUJ:[id number] DOB: [birth year] DOA: 12/20/2022 PCP: Rachel Rigg, NP  Chief Complaint  Patient presents with   Hypotension    Brief Narrative:   62 year old female patient with multiple medical comorbidities as listed below. History primarily provided by her her daughter [name]. Brought to the emergency room on 10/5With patient reporting nausea and vomiting, and weakness. Patient has been off medications for at least Birttany Dechellis month if not more specifically not taking Synthroid. Had also missed at least 4 days of her Lamictal and lithium. However onset of deterioration noted for over about Joclyn Alsobrook month's time now, specifically daughter noted decreased activity, always feeling cold and wanting blankets, poor appetite, and more recently difficulty walking and having trouble with her balance. As noted the nausea and vomiting had also gotten worse which was the final driving factor to bring her to the emergency room. On arrival she was found to be bradycardic with heart rate in the 40s to 50s, hypotensive with systolic blood pressure in the 70s, hypothermic with temperature of 92.3. She was lethargic but oriented x 3 initial hemoglobin 9.5 white blood cell count normal at 8.9 had Niyati Heinke nonanion gap metabolic acidosis, and mild AKI initial TSH was 67.015 initial venous blood gas pH 7.33 pCO2 of 33 pO2 of 48 HCO3 17.5. She was administered IV fluids, cultures were sent, cortisol sent, free T and free T4 sent, stress dose steroids initiated, and because of her ongoing hypotension norepinephrine infusion was started and critical care asked to admit.   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  Has Handsome Anglin history of noncompliance.  Bradycardic, hypothermic ,hypotensive, lethargic TSH almost 70. -Received loading dose of levothyroxine, continue 100 mcg daily - Follow serial TSH and free T4 - Continue stress dose steroids, although normal baseline cortisol level. Will taper quickly.    Acute metabolic encephalopathy, improving -Hold PTA lithium today.  Restarted Lamictal. - PT, OT - Reorientation. - Synthroid -- delirium precautions   AKI, improving - fluctuating, trend   Hypokalemia, resolved - Continue to monitor   Hypophosphatemia, resolved - Monitor   N&V -Antiemetics as needed   GERD -Continue PPI   UTI -add on urine culture (no growth) - d/c abx     DVT prophylaxis: heparin Code Status: full Family Communication: none Disposition:   Status is: Inpatient Remains inpatient appropriate because: need for continued inpatient care   Consultants:  Pccm cards  Procedures:  Echo IMPRESSIONS     1. Left ventricular ejection fraction, by estimation, is 65 to 70%. The  left ventricle has normal function. The left ventricle has no regional  wall motion abnormalities. Left ventricular diastolic parameters were  normal.   2. Right ventricular systolic function is normal. The right ventricular  size is normal.   3. The mitral valve is normal in structure. Trivial mitral valve  regurgitation. No evidence of mitral stenosis.   4. The aortic valve is tricuspid. Aortic valve regurgitation is not  visualized. No aortic stenosis is present.  PROGRESS NOTE    Rachel Everett  WUJ:[id number] DOB: [birth year] DOA: 12/20/2022 PCP: Rachel Rigg, NP  Chief Complaint  Patient presents with   Hypotension    Brief Narrative:   62 year old female patient with multiple medical comorbidities as listed below. History primarily provided by her her daughter [name]. Brought to the emergency room on 10/5With patient reporting nausea and vomiting, and weakness. Patient has been off medications for at least Birttany Dechellis month if not more specifically not taking Synthroid. Had also missed at least 4 days of her Lamictal and lithium. However onset of deterioration noted for over about Joclyn Alsobrook month's time now, specifically daughter noted decreased activity, always feeling cold and wanting blankets, poor appetite, and more recently difficulty walking and having trouble with her balance. As noted the nausea and vomiting had also gotten worse which was the final driving factor to bring her to the emergency room. On arrival she was found to be bradycardic with heart rate in the 40s to 50s, hypotensive with systolic blood pressure in the 70s, hypothermic with temperature of 92.3. She was lethargic but oriented x 3 initial hemoglobin 9.5 white blood cell count normal at 8.9 had Niyati Heinke nonanion gap metabolic acidosis, and mild AKI initial TSH was 67.015 initial venous blood gas pH 7.33 pCO2 of 33 pO2 of 48 HCO3 17.5. She was administered IV fluids, cultures were sent, cortisol sent, free T and free T4 sent, stress dose steroids initiated, and because of her ongoing hypotension norepinephrine infusion was started and critical care asked to admit.   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  Has Handsome Anglin history of noncompliance.  Bradycardic, hypothermic ,hypotensive, lethargic TSH almost 70. -Received loading dose of levothyroxine, continue 100 mcg daily - Follow serial TSH and free T4 - Continue stress dose steroids, although normal baseline cortisol level. Will taper quickly.    Acute metabolic encephalopathy, improving -Hold PTA lithium today.  Restarted Lamictal. - PT, OT - Reorientation. - Synthroid -- delirium precautions   AKI, improving - fluctuating, trend   Hypokalemia, resolved - Continue to monitor   Hypophosphatemia, resolved - Monitor   N&V -Antiemetics as needed   GERD -Continue PPI   UTI -add on urine culture (no growth) - d/c abx     DVT prophylaxis: heparin Code Status: full Family Communication: none Disposition:   Status is: Inpatient Remains inpatient appropriate because: need for continued inpatient care   Consultants:  Pccm cards  Procedures:  Echo IMPRESSIONS     1. Left ventricular ejection fraction, by estimation, is 65 to 70%. The  left ventricle has normal function. The left ventricle has no regional  wall motion abnormalities. Left ventricular diastolic parameters were  normal.   2. Right ventricular systolic function is normal. The right ventricular  size is normal.   3. The mitral valve is normal in structure. Trivial mitral valve  regurgitation. No evidence of mitral stenosis.   4. The aortic valve is tricuspid. Aortic valve regurgitation is not  visualized. No aortic stenosis is present.  8.9 9.0  --   MG  --   --   --  2.0  --  2.0  --  2.3  PHOS  --   --   --  1.9*  --  3.1  --  3.5    GFR: Estimated Creatinine Clearance: 49 mL/min (Rachel Everett) (by C-G formula based on SCr of 1.24 mg/dL (H)).  Liver Function Tests: Recent Labs  Lab 12/20/22 0728  AST 24  ALT 21  ALKPHOS 60  BILITOT <0.1*  PROT 4.8*  ALBUMIN 2.8*    CBG: Recent Labs  Lab 12/20/22 1147  GLUCAP 121*     Recent Results (from the past 240 hour(s))  Culture, blood (Routine X 2) w Reflex to ID Panel     Status: None (Preliminary result)   Collection Time: 12/20/22 10:13 AM   Specimen: BLOOD  Result Value Ref Range Status   Specimen Description BLOOD SITE NOT SPECIFIED  Final   Special Requests   Final    BOTTLES DRAWN AEROBIC AND ANAEROBIC Blood Culture adequate volume   Culture   Final    NO GROWTH 2 DAYS Performed at [facility], [address] [place], [place] [address]    Report Status PENDING  Incomplete  MRSA Next Gen by PCR, Nasal     Status: None   Collection Time: 12/20/22 11:45 AM   Specimen: Nasal Mucosa; Nasal Swab  Result Value Ref Range Status   MRSA by PCR Next Gen NOT DETECTED NOT DETECTED Final    Comment: (NOTE) The GeneXpert MRSA Assay (FDA approved for NASAL specimens only), is one component of Rachel Everett comprehensive MRSA colonization surveillance program. It is not intended to diagnose MRSA infection nor to guide or monitor treatment for MRSA infections. Test performance  is not FDA approved in patients less than 69 years old. Performed at [facility], [address] [place], [place] [address]   Culture, blood (Routine X 2) w Reflex to ID Panel     Status: None (Preliminary result)   Collection Time: 12/20/22 12:29 PM   Specimen: BLOOD LEFT ARM  Result Value Ref Range Status   Specimen Description BLOOD LEFT ARM  Final   Special Requests   Final    BOTTLES DRAWN AEROBIC ONLY Blood Culture adequate volume   Culture   Final    NO GROWTH 2 DAYS Performed at [facility], [address] [place], [place] [address]    Report Status PENDING  Incomplete  Urine Culture (for pregnant, neutropenic or urologic patients or patients with an indwelling urinary catheter)     Status: None   Collection Time: 12/21/22  1:37 PM   Specimen: Urine, Clean Catch  Result Value Ref Range Status   Specimen Description URINE, CLEAN CATCH  Final   Special Requests NONE  Final   Culture   Final    NO GROWTH Performed at [facility], [address] [place], [place] [address]    Report Status 12/22/2022 FINAL  Final         Radiology Studies: ECHOCARDIOGRAM COMPLETE  Result Date: 12/21/2022    ECHOCARDIOGRAM REPORT   Patient Name:   Rachel Everett Date of Exam: 12/21/2022 Medical Rec #:  [id number]   Height:       66.0 in Accession #:    [id number]  Weight:       160.9 lb Date of Birth:  Feb 07, 1961  BSA:  8.9 9.0  --   MG  --   --   --  2.0  --  2.0  --  2.3  PHOS  --   --   --  1.9*  --  3.1  --  3.5    GFR: Estimated Creatinine Clearance: 49 mL/min (Rachel Everett) (by C-G formula based on SCr of 1.24 mg/dL (H)).  Liver Function Tests: Recent Labs  Lab 12/20/22 0728  AST 24  ALT 21  ALKPHOS 60  BILITOT <0.1*  PROT 4.8*  ALBUMIN 2.8*    CBG: Recent Labs  Lab 12/20/22 1147  GLUCAP 121*     Recent Results (from the past 240 hour(s))  Culture, blood (Routine X 2) w Reflex to ID Panel     Status: None (Preliminary result)   Collection Time: 12/20/22 10:13 AM   Specimen: BLOOD  Result Value Ref Range Status   Specimen Description BLOOD SITE NOT SPECIFIED  Final   Special Requests   Final    BOTTLES DRAWN AEROBIC AND ANAEROBIC Blood Culture adequate volume   Culture   Final    NO GROWTH 2 DAYS Performed at [facility], [address] [place], [place] [address]    Report Status PENDING  Incomplete  MRSA Next Gen by PCR, Nasal     Status: None   Collection Time: 12/20/22 11:45 AM   Specimen: Nasal Mucosa; Nasal Swab  Result Value Ref Range Status   MRSA by PCR Next Gen NOT DETECTED NOT DETECTED Final    Comment: (NOTE) The GeneXpert MRSA Assay (FDA approved for NASAL specimens only), is one component of Rachel Everett comprehensive MRSA colonization surveillance program. It is not intended to diagnose MRSA infection nor to guide or monitor treatment for MRSA infections. Test performance  is not FDA approved in patients less than 69 years old. Performed at [facility], [address] [place], [place] [address]   Culture, blood (Routine X 2) w Reflex to ID Panel     Status: None (Preliminary result)   Collection Time: 12/20/22 12:29 PM   Specimen: BLOOD LEFT ARM  Result Value Ref Range Status   Specimen Description BLOOD LEFT ARM  Final   Special Requests   Final    BOTTLES DRAWN AEROBIC ONLY Blood Culture adequate volume   Culture   Final    NO GROWTH 2 DAYS Performed at [facility], [address] [place], [place] [address]    Report Status PENDING  Incomplete  Urine Culture (for pregnant, neutropenic or urologic patients or patients with an indwelling urinary catheter)     Status: None   Collection Time: 12/21/22  1:37 PM   Specimen: Urine, Clean Catch  Result Value Ref Range Status   Specimen Description URINE, CLEAN CATCH  Final   Special Requests NONE  Final   Culture   Final    NO GROWTH Performed at [facility], [address] [place], [place] [address]    Report Status 12/22/2022 FINAL  Final         Radiology Studies: ECHOCARDIOGRAM COMPLETE  Result Date: 12/21/2022    ECHOCARDIOGRAM REPORT   Patient Name:   Rachel Everett Date of Exam: 12/21/2022 Medical Rec #:  [id number]   Height:       66.0 in Accession #:    [id number]  Weight:       160.9 lb Date of Birth:  Feb 07, 1961  BSA:

## 2022-12-22 NOTE — Plan of Care (Signed)

## 2022-12-23 ENCOUNTER — Inpatient Hospital Stay (HOSPITAL_COMMUNITY): Payer: Medicare HMO

## 2022-12-23 DIAGNOSIS — E035 Myxedema coma: Secondary | ICD-10-CM | POA: Diagnosis not present

## 2022-12-23 LAB — CBC WITH DIFFERENTIAL/PLATELET
Abs Immature Granulocytes: 0.15 10*3/uL — ABNORMAL HIGH (ref 0.00–0.07)
Basophils Absolute: 0 10*3/uL (ref 0.0–0.1)
Basophils Relative: 0 %
Eosinophils Absolute: 0 10*3/uL (ref 0.0–0.5)
Eosinophils Relative: 0 %
HCT: 33.4 % — ABNORMAL LOW (ref 36.0–46.0)
Hemoglobin: 11.1 g/dL — ABNORMAL LOW (ref 12.0–15.0)
Immature Granulocytes: 1 %
Lymphocytes Relative: 14 %
Lymphs Abs: 2.4 10*3/uL (ref 0.7–4.0)
MCH: 31.2 pg (ref 26.0–34.0)
MCHC: 33.2 g/dL (ref 30.0–36.0)
MCV: 93.8 fL (ref 80.0–100.0)
Monocytes Absolute: 0.7 10*3/uL (ref 0.1–1.0)
Monocytes Relative: 4 %
Neutro Abs: 13.3 10*3/uL — ABNORMAL HIGH (ref 1.7–7.7)
Neutrophils Relative %: 81 %
Platelets: 207 10*3/uL (ref 150–400)
RBC: 3.56 MIL/uL — ABNORMAL LOW (ref 3.87–5.11)
RDW: 14.5 % (ref 11.5–15.5)
WBC: 16.6 10*3/uL — ABNORMAL HIGH (ref 4.0–10.5)
nRBC: 0 % (ref 0.0–0.2)

## 2022-12-23 LAB — PHOSPHORUS: Phosphorus: 2.6 mg/dL (ref 2.5–4.6)

## 2022-12-23 LAB — COMPREHENSIVE METABOLIC PANEL
ALT: 21 U/L (ref 0–44)
AST: 22 U/L (ref 15–41)
Albumin: 3.3 g/dL — ABNORMAL LOW (ref 3.5–5.0)
Alkaline Phosphatase: 74 U/L (ref 38–126)
Anion gap: 11 (ref 5–15)
BUN: 7 mg/dL — ABNORMAL LOW (ref 8–23)
CO2: 21 mmol/L — ABNORMAL LOW (ref 22–32)
Calcium: 9 mg/dL (ref 8.9–10.3)
Chloride: 106 mmol/L (ref 98–111)
Creatinine, Ser: 1.13 mg/dL — ABNORMAL HIGH (ref 0.44–1.00)
GFR, Estimated: 55 mL/min — ABNORMAL LOW (ref >60)
Glucose, Bld: 101 mg/dL — ABNORMAL HIGH (ref 70–99)
Potassium: 3.3 mmol/L — ABNORMAL LOW (ref 3.5–5.1)
Sodium: 138 mmol/L (ref 135–145)
Total Bilirubin: 0.5 mg/dL (ref 0.3–1.2)
Total Protein: 5.7 g/dL — ABNORMAL LOW (ref 6.5–8.1)

## 2022-12-23 LAB — LIPASE, BLOOD: Lipase: 25 U/L (ref 11–51)

## 2022-12-23 LAB — MAGNESIUM: Magnesium: 2.3 mg/dL (ref 1.7–2.4)

## 2022-12-23 MED ORDER — POTASSIUM CHLORIDE CRYS ER 20 MEQ PO TBCR
40.0000 meq | EXTENDED_RELEASE_TABLET | ORAL | Status: AC
  Filled 2022-12-23: qty 2

## 2022-12-23 MED ORDER — LEVOTHYROXINE SODIUM 100 MCG/5ML IV SOLN
100.0000 ug | Freq: Every day | INTRAVENOUS | Status: DC
  Administered 2022-12-24 – 2022-12-26 (×3): 100 ug via INTRAVENOUS
  Filled 2022-12-23 (×3): qty 5

## 2022-12-23 MED ORDER — IOHEXOL 350 MG/ML SOLN
75.0000 mL | Freq: Once | INTRAVENOUS | Status: AC | PRN
  Administered 2022-12-23: 75 mL via INTRAVENOUS

## 2022-12-23 NOTE — Progress Notes (Signed)
PROGRESS NOTE    Rachel Everett  NWG:[id number] DOB: 1960-09-24 DOA: 12/20/2022 PCP: Claiborne Rigg, NP  Chief Complaint  Patient presents with   Hypotension    Brief Narrative:   62 year old female patient with multiple medical comorbidities as listed below. History primarily provided by her her daughter [name]. Brought to the emergency room on 10/5With patient reporting nausea and vomiting, and weakness. Patient has been off medications for at least Lesette Frary month if not more specifically not taking Synthroid. Had also missed at least 4 days of her Lamictal and lithium. However onset of deterioration noted for over about Iyanla Eilers month's time now, specifically daughter noted decreased activity, always feeling cold and wanting blankets, poor appetite, and more recently difficulty walking and having trouble with her balance. As noted the nausea and vomiting had also gotten worse which was the final driving factor to bring her to the emergency room. On arrival she was found to be bradycardic with heart rate in the 40s to 50s, hypotensive with systolic blood pressure in the 70s, hypothermic with temperature of 92.3. She was lethargic but oriented x 3 initial hemoglobin 9.5 white blood cell count normal at 8.9 had Yanisa Goodgame nonanion gap metabolic acidosis, and mild AKI initial TSH was 67.015 initial venous blood gas pH 7.33 pCO2 of 33 pO2 of 48 HCO3 17.5. She was administered IV fluids, cultures were sent, cortisol sent, free T and free T4 sent, stress dose steroids initiated, and because of her ongoing hypotension norepinephrine infusion was started and critical care asked to admit.   Significant events 10/5 admitted, lethargic, heart rate in the 40s to 50s, hypotensive requiring norepinephrine. TSH 67.015, started on norepinephrine, stress dose steroids, loaded with 200 of levothyroxine with plan to continue 100 IV daily admitted to ICU  10/7 TRH assumed care 10/8 CT abd/pelvis for abdominal pain/nausea  Assessment &  Plan:   Principal Problem:   Myxedema coma (HCC)  Abdominal Pain  Nausea CT abd/pelvis from 10/5 with subjective wall thickening and mucosal hyperenhancement involving the cecum, ascending colon, and proximal transverse colon --- possible colitis, infiltrative mass not excluded She notes abd/ discomfort and nausea recently  Will repeat CT scan, follow lipase Transition synthroid back to IV, continue IV steroids   Shock due to symptomatic bradycardia related to myxedema and electrolyte abnormalities -Remains off pressors - Oral rehydration - Continue to hold beta-blocker - will d/c abx   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  Has [name] history of noncompliance.  Bradycardic, hypothermic ,hypotensive, lethargic TSH almost 70. -Received loading dose of levothyroxine, continue 100 mcg daily - Follow serial TSH and free T4 - Continue stress dose steroids, although normal baseline cortisol level. Will taper quickly.    Acute metabolic encephalopathy, improving -Hold PTA lithium today.  Restarted Lamictal. - PT, OT - Reorientation. - Synthroid -- delirium precautions   AKI, improving - fluctuating, trend   Hypokalemia, resolved - Continue to monitor   Hypophosphatemia, resolved - Monitor   N&V -Antiemetics as needed   GERD -Continue PPI   UTI -add on urine culture (no growth) - d/c abx     DVT prophylaxis: heparin Code Status: full Family Communication: none Disposition:   Status is: Inpatient Remains inpatient appropriate because: need for  continued inpatient care   Consultants:  Pccm cards  Procedures:  Echo IMPRESSIONS     1. Left ventricular ejection fraction, by  estimation, is 65 to 70%. The  left ventricle has normal function. The left ventricle has no regional  wall motion abnormalities. Left ventricular diastolic parameters were  normal.   2. Right ventricular systolic function is normal. The right ventricular  size is normal.   3. The mitral valve is normal in structure. Trivial mitral valve  regurgitation. No evidence of mitral stenosis.   4. The aortic valve is tricuspid. Aortic valve regurgitation is not  visualized. No aortic stenosis is present.   5. The inferior vena cava is normal in size with greater than 50%  respiratory variability, suggesting right atrial pressure of 3 mmHg.   Antimicrobials:  Anti-infectives (From admission, onward)    Start     Dose/Rate Route Frequency Ordered Stop   12/21/22 1400  cephALEXin (KEFLEX) capsule 500 mg  Status:  Discontinued        500 mg Oral Every 8 hours 12/21/22 1008 12/21/22 1011   12/21/22 1400  cephALEXin (KEFLEX) capsule 500 mg  Status:  Discontinued        500 mg Oral Every 12 hours 12/21/22 1011 12/21/22 1011   12/21/22 1400  cephALEXin (KEFLEX) capsule 500 mg  Status:  Discontinued        500 mg Oral Every 12 hours 12/21/22 1011 12/22/22 1802   12/20/22 1400  piperacillin-tazobactam (ZOSYN) IVPB 3.375 g  Status:  Discontinued        3.375 g 12.5 mL/hr over 240 Minutes Intravenous Every 8 hours 12/20/22 1020 12/21/22 1008   12/20/22 1030  vancomycin (VANCOCIN) IVPB 1000 mg/200 mL premix  Status:  Discontinued        1,000 mg 200 mL/hr over 60 Minutes Intravenous Every 24 hours 12/20/22 1020 12/21/22 1008       Subjective: C/o nausea and abdominal discomfort  Objective: Vitals:   12/22/22 1944 12/23/22 0458 12/23/22 0759 12/23/22 1553  BP: 122/77 111/70 109/77 105/73  Pulse: 66 66 (!) 57 (!) 59  Resp:  18    Temp: 97.8 F (36.6 C) 97.9 F (36.6 C)    TempSrc: Oral Oral    SpO2: 96% 93% 95% 97%  Weight:      Height:        Intake/Output Summary (Last 24 hours) at  12/23/2022 1923 Last data filed at 12/23/2022 1800 Gross per 24 hour  Intake 290 ml  Output --  Net 290 ml   Filed Weights   12/20/22 0702 12/20/22 1143 12/22/22 0610  Weight: 94.4 kg 73 kg 76 kg    Examination:  General: No acute distress. Cardiovascular: RRR Lungs: unlabored Abdomen: diffuse abdominal ttp  Neurological: Alert and oriented 3. Moves all extremities 4 with equal strength. Cranial nerves II through XII grossly intact. Extremities: No clubbing or cyanosis. No edema.   Data Reviewed: I have personally reviewed following labs and imaging studies  CBC: Recent Labs  Lab 12/20/22 0728 12/20/22 0746 12/20/22 0747 12/20/22 1229 12/21/22 0300 12/23/22 0513  WBC 8.9  --   --  12.8* 10.7* 16.6*  NEUTROABS  --   --   --   --   --  13.3*  HGB 9.7* 9.2* 9.5* 11.3* 11.2* 11.1*  HCT 30.6* 27.0* 28.0* 35.4* 33.6* 33.4*  MCV 101.0*  --   --  98.6 95.7 93.8  PLT 158  --   --  227  227 207    Basic Metabolic Panel: Recent Labs  Lab 12/20/22 0728 12/20/22 0746 12/20/22 0747 12/20/22 0852 12/20/22 1229 12/21/22 0300 12/21/22 1055 12/22/22 0954 12/23/22 0513  NA 139 141 142  --   --  140 138  --  138  K 3.0* 2.8* 2.8*  --   --  3.7 3.8  --  3.3*  CL 111  --  111  --   --  106 107  --  106  CO2 18*  --   --   --   --  18* 18*  --  21*  GLUCOSE 121*  --  115*  --   --  148* 152*  --  101*  BUN 9  --  8  --   --  <5* 5*  --  7*  CREATININE 1.14*  --  1.00  --  1.12* 1.14* 1.24*  --  1.13*  CALCIUM 7.3*  --   --   --   --  8.9 9.0  --  9.0  MG  --   --   --  2.0  --  2.0  --  2.3 2.3  PHOS  --   --   --  1.9*  --  3.1  --  3.5 2.6    GFR: Estimated Creatinine Clearance: 53.8 mL/min (Antawan Mchugh) (by C-G formula based on SCr of 1.13 mg/dL (H)).  Liver Function Tests: Recent Labs  Lab 12/20/22 0728 12/23/22 0513  AST 24 22  ALT 21 21  ALKPHOS 60 74  BILITOT <0.1* 0.5  PROT 4.8* 5.7*  ALBUMIN 2.8* 3.3*    CBG: Recent Labs  Lab 12/20/22 1147  GLUCAP 121*      Recent Results (from the past 240 hour(s))  Culture, blood (Routine X 2) w Reflex to ID Panel     Status: None (Preliminary result)   Collection Time: 12/20/22 10:13 AM   Specimen: BLOOD  Result Value Ref Range Status   Specimen Description BLOOD SITE NOT SPECIFIED  Final   Special Requests   Final    BOTTLES DRAWN AEROBIC AND ANAEROBIC Blood Culture adequate volume   Culture   Final    NO GROWTH 3 DAYS Performed at [facility], [address], [place], [place] [address]    Report Status PENDING  Incomplete  MRSA Next Gen by PCR, Nasal     Status: None   Collection Time: 12/20/22 11:45 AM   Specimen: Nasal Mucosa; Nasal Swab  Result Value Ref Range Status   MRSA by PCR Next Gen NOT DETECTED NOT DETECTED Final    Comment: (NOTE) The GeneXpert MRSA Assay (FDA approved for NASAL specimens only), is one component of Jadiel Schmieder comprehensive MRSA colonization surveillance program. It is not intended to diagnose MRSA infection nor to guide or monitor treatment for MRSA infections. Test performance is not FDA approved in patients less than 77 years old. Performed at [facility], [address], [place], [place] [address]   Culture, blood (Routine X 2) w Reflex to ID Panel     Status: None (Preliminary result)   Collection Time: 12/20/22 12:29 PM   Specimen: BLOOD LEFT ARM  Result Value Ref Range Status   Specimen Description BLOOD LEFT ARM  Final   Special Requests   Final    BOTTLES DRAWN AEROBIC ONLY Blood Culture adequate volume   Culture   Final    NO GROWTH 3 DAYS Performed at [facility],  [address], [place], [place] [address]    Report Status PENDING  Incomplete  Urine Culture (for pregnant, neutropenic or urologic patients or patients with an indwelling urinary catheter)     Status: None   Collection Time: 12/21/22  1:37 PM   Specimen: Urine, Clean Catch  Result Value Ref Range Status   Specimen Description URINE, CLEAN CATCH  Final   Special  Requests NONE  Final   Culture   Final    NO GROWTH Performed at [facility], [address], [place], [place] [address]    Report Status 12/22/2022 FINAL  Final         Radiology Studies: No results found.      Scheduled Meds:  heparin  5,000 Units Subcutaneous Q8H   hydrocortisone sodium succinate  50 mg Intravenous Q12H   hydrOXYzine  50 mg Oral BID   lamoTRIgine  200 mg Oral BID   levothyroxine  137 mcg Oral Q0600   pantoprazole  40 mg Oral BID   potassium chloride  40 mEq Oral Q4H   Continuous Infusions:  promethazine (PHENERGAN) injection (IM or IVPB) 12.5 mg (12/23/22 1701)     LOS: 3 days    Time spent: over 30 min    Lacretia Nicks, MD [facility]   To contact the attending provider between 7A-7P or the covering provider during after hours 7P-7A, please log into the web site [email or web address] and access using universal [place] password for that web site. If you do not have the password, please call the hospital operator.  12/23/2022, 7:23 PM

## 2022-12-23 NOTE — Plan of Care (Signed)

## 2022-12-23 NOTE — Care Management Important Message (Signed)
Important Message  Patient Details  Name: Rachel Everett MRN: [id number] Date of Birth: [birth year]   Important Message Given:  Yes - Medicare IM     Dorena Bodo 12/23/2022, 1:21 PM

## 2022-12-24 DIAGNOSIS — E035 Myxedema coma: Secondary | ICD-10-CM | POA: Diagnosis not present

## 2022-12-24 LAB — COMPREHENSIVE METABOLIC PANEL
ALT: 23 U/L (ref 0–44)
AST: 42 U/L — ABNORMAL HIGH (ref 15–41)
Albumin: 3.6 g/dL (ref 3.5–5.0)
Alkaline Phosphatase: 82 U/L (ref 38–126)
Anion gap: 11 (ref 5–15)
BUN: 9 mg/dL (ref 8–23)
CO2: 22 mmol/L (ref 22–32)
Calcium: 9.2 mg/dL (ref 8.9–10.3)
Chloride: 107 mmol/L (ref 98–111)
Creatinine, Ser: 1.24 mg/dL — ABNORMAL HIGH (ref 0.44–1.00)
GFR, Estimated: 49 mL/min — ABNORMAL LOW (ref >60)
Glucose, Bld: 125 mg/dL — ABNORMAL HIGH (ref 70–99)
Potassium: 2.9 mmol/L — ABNORMAL LOW (ref 3.5–5.1)
Sodium: 140 mmol/L (ref 135–145)
Total Bilirubin: 0.7 mg/dL (ref 0.3–1.2)
Total Protein: 6.1 g/dL — ABNORMAL LOW (ref 6.5–8.1)

## 2022-12-24 LAB — CBC WITH DIFFERENTIAL/PLATELET
Abs Immature Granulocytes: 0.13 10*3/uL — ABNORMAL HIGH (ref 0.00–0.07)
Basophils Absolute: 0 10*3/uL (ref 0.0–0.1)
Basophils Relative: 0 %
Eosinophils Absolute: 0 10*3/uL (ref 0.0–0.5)
Eosinophils Relative: 0 %
HCT: 35.2 % — ABNORMAL LOW (ref 36.0–46.0)
Hemoglobin: 11.4 g/dL — ABNORMAL LOW (ref 12.0–15.0)
Immature Granulocytes: 1 %
Lymphocytes Relative: 16 %
Lymphs Abs: 1.8 10*3/uL (ref 0.7–4.0)
MCH: 30.5 pg (ref 26.0–34.0)
MCHC: 32.4 g/dL (ref 30.0–36.0)
MCV: 94.1 fL (ref 80.0–100.0)
Monocytes Absolute: 0.4 10*3/uL (ref 0.1–1.0)
Monocytes Relative: 4 %
Neutro Abs: 8.4 10*3/uL — ABNORMAL HIGH (ref 1.7–7.7)
Neutrophils Relative %: 79 %
Platelets: 227 10*3/uL (ref 150–400)
RBC: 3.74 MIL/uL — ABNORMAL LOW (ref 3.87–5.11)
RDW: 14.5 % (ref 11.5–15.5)
WBC: 10.8 10*3/uL — ABNORMAL HIGH (ref 4.0–10.5)
nRBC: 0 % (ref 0.0–0.2)

## 2022-12-24 LAB — T4, FREE: Free T4: 0.41 ng/dL — ABNORMAL LOW (ref 0.61–1.12)

## 2022-12-24 LAB — TSH: TSH: 39.872 u[IU]/mL — ABNORMAL HIGH (ref 0.350–4.500)

## 2022-12-24 LAB — PHOSPHORUS: Phosphorus: 2.9 mg/dL (ref 2.5–4.6)

## 2022-12-24 LAB — MAGNESIUM: Magnesium: 2.2 mg/dL (ref 1.7–2.4)

## 2022-12-24 MED ORDER — POTASSIUM CHLORIDE 20 MEQ PO PACK
40.0000 meq | PACK | Freq: Once | ORAL | Status: AC
  Administered 2022-12-24: 40 meq via ORAL
  Filled 2022-12-24: qty 2

## 2022-12-24 NOTE — Progress Notes (Signed)
    Durable Medical Equipment  (From admission, onward)           Start     Ordered   12/24/22 1056  For home use only DME Bedside commode  Once       Comments: Please provide 3:1 since patient is unable to navigate to bathroom and needs toileting at bedside due to difficulty mobilizing  Question:  Patient needs a bedside commode to treat with the following condition  Answer:  Myxedema coma (HCC)   12/24/22 1057   12/24/22 1055  For home use only DME Walker rolling  Once       Question Answer Comment  Walker: With 5 Inch Wheels   Patient needs a walker to treat with the following condition Myxedema coma (HCC)      12/24/22 1057

## 2022-12-24 NOTE — Plan of Care (Signed)

## 2022-12-24 NOTE — Progress Notes (Signed)
Occupational Therapy Treatment Patient Details Name: Rachel Everett MRN: [id number] DOB: 08-03-1960 Today's Date: 12/24/2022   History of present illness 62 yo female admitted 10/5 with N/V, generalized weakness. Pt noted to be hypothermic, hypotensive and bradycardic with myxedema. PMHx: bipolar disorder on lithium, hypothyroidism, HLD   OT comments  Pt progressing toward established OT goals. Pt currently requires minA for functional mobility with minimal to moderate cues for safety, hand placement and sequencing. Pt completed grooming while standing at sink level. She will continue to benefit from continued skilled OT services to maximize progress toward prior level of functioning. Will continue to follow acutely. D/c recommendation remains appropriate for HHOT.       If plan is discharge home, recommend the following:  A little help with walking and/or transfers;Assistance with cooking/housework;Direct supervision/assist for medications management;Supervision due to cognitive status;A little help with bathing/dressing/bathroom   Equipment Recommendations  BSC/3in1    Recommendations for Other Services      Precautions / Restrictions Precautions Precautions: Fall Restrictions Weight Bearing Restrictions: No       Mobility Bed Mobility Overal bed mobility: Needs Assistance Bed Mobility: Supine to Sit, Sit to Supine     Supine to sit: Supervision Sit to supine: Supervision   General bed mobility comments: supervision for safety    Transfers Overall transfer level: Needs assistance Equipment used: Rolling walker (2 wheels) Transfers: Sit to/from Stand, Bed to chair/wheelchair/BSC Sit to Stand: Min assist           General transfer comment: minA for vc for safe hand placement and assist for stability in standing     Balance Overall balance assessment: Needs assistance Sitting-balance support: No upper extremity supported, Feet supported Sitting balance-Leahy Scale:  Fair     Standing balance support: During functional activity, Reliant on assistive device for balance, Single extremity supported Standing balance-Leahy Scale: Poor Standing balance comment: reliant on single UE support in standing                           ADL either performed or assessed with clinical judgement   ADL Overall ADL's : Needs assistance/impaired     Grooming: Contact guard assist;Standing;Wash/dry hands;Wash/dry face;Oral care               Lower Body Dressing: Minimal assistance;Sit to/from stand   Toilet Transfer: Minimal assistance;Rolling walker (2 wheels);Ambulation   Toileting- Clothing Manipulation and Hygiene: Minimal assistance;Sit to/from stand       Functional mobility during ADLs: Minimal assistance;Rolling walker (2 wheels)      Extremity/Trunk Assessment Upper Extremity Assessment Upper Extremity Assessment: Generalized weakness   Lower Extremity Assessment Lower Extremity Assessment: Generalized weakness        Vision       Perception Perception Perception: Within Functional Limits   Praxis Praxis Praxis: WFL    Cognition Arousal: Alert Behavior During Therapy: WFL for tasks assessed/performed Overall Cognitive Status: No family/caregiver present to determine baseline cognitive functioning Area of Impairment: Memory, Safety/judgement, Problem solving                     Memory: Decreased short-term memory   Safety/Judgement: Decreased awareness of deficits   Problem Solving: Slow processing, Requires verbal cues, Difficulty sequencing General Comments: pt with decreased recall and carry over, requires frequent cues for intiation, cues for sequencing with grooming        Exercises      Shoulder Instructions  General Comments      Pertinent Vitals/ Pain       Pain Assessment Pain Assessment: No/denies pain Pain Intervention(s): Monitored during session  Home Living                                           Prior Functioning/Environment              Frequency  Min 1X/week        Progress Toward Goals  OT Goals(current goals can now be found in the care plan section)  Progress towards OT goals: Progressing toward goals  Acute Rehab OT Goals Patient Stated Goal: to go home OT Goal Formulation: With patient Time For Goal Achievement: 01/05/23 Potential to Achieve Goals: Good ADL Goals Pt Will Perform Grooming: with modified independence;standing (2 tasks at the sink) Pt Will Perform Lower Body Bathing: with supervision;sit to/from stand Pt Will Perform Lower Body Dressing: with supervision;sit to/from stand Pt Will Transfer to Toilet: with modified independence;bedside commode;ambulating Pt Will Perform Toileting - Clothing Manipulation and hygiene: with modified independence;sit to/from stand Additional ADL Goal #1: Pt will transfer from flat bed supine to sit EOB with supervision in preparation for selfcare tasks.  Plan      Co-evaluation                 AM-PAC OT "6 Clicks" Daily Activity     Outcome Measure   Help from another person eating meals?: None Help from another person taking care of personal grooming?: A Little Help from another person toileting, which includes using toliet, bedpan, or urinal?: A Little Help from another person bathing (including washing, rinsing, drying)?: A Little Help from another person to put on and taking off regular upper body clothing?: A Little Help from another person to put on and taking off regular lower body clothing?: A Little 6 Click Score: 19    End of Session Equipment Utilized During Treatment: Gait belt;Rolling walker (2 wheels)  OT Visit Diagnosis: Unsteadiness on feet (R26.81);Other abnormalities of gait and mobility (R26.89);Repeated falls (R29.6);Other symptoms and signs involving the nervous system (R29.898)   Activity Tolerance Patient tolerated treatment well    Patient Left with call bell/phone within reach;with bed alarm set;in bed   Nurse Communication Mobility status        Time: 1152-1207 OT Time Calculation (min): 15 min  Charges: OT General Charges $OT Visit: 1 Visit OT Treatments $Self Care/Home Management : 8-22 mins  Rosey Bath OTR/L Acute Rehabilitation Services Office: [phone]   Providence Crosby 12/24/2022, 1:25 PM

## 2022-12-24 NOTE — Progress Notes (Signed)
Physical Therapy Treatment Patient Details Name: Rachel Everett MRN: [id number] DOB: Feb 09, 1961 Today's Date: 12/24/2022   History of Present Illness 62 yo female admitted 10/5 with N/V, generalized weakness. Pt noted to be hypothermic, hypotensive and bradycardic with myxedema. PMHx: bipolar disorder on lithium, hypothyroidism, HLD    PT Comments  Pt greeted resting in bed and agreeable to session with continued progress towards acute goals. Pt without c/o dizziness/lightheadedness throughout all mobility. Pt requiring grossly supervision- CGA for bed mobility, transfers and gait with RW for support. Pt needing mod-max cues for safety, hand placement, and attention to task throughout session as pt with poor recall from prior sessions and general poor carryover. Pt able to complete x10 serial sit<>stands at end of session for increased LE strength and reinforcement of optimal hand placement. Pt was educated on continued walker use to maximize functional independence, safety, and decrease risk for falls, as well as appropriate activity progression, importance of continued mobility, HEP and compliance and importance of upright sitting and time OOB throughout day with pt verbalizing understanding. Pt continues to benefit from skilled PT services to progress toward functional mobility goals.     If plan is discharge home, recommend the following: A little help with walking and/or transfers;Assistance with cooking/housework;Assist for transportation;Help with stairs or ramp for entrance   Can travel by private vehicle        Equipment Recommendations  Rolling walker (2 wheels)    Recommendations for Other Services       Precautions / Restrictions Precautions Precautions: Fall Restrictions Weight Bearing Restrictions: No     Mobility  Bed Mobility Overal bed mobility: Needs Assistance Bed Mobility: Supine to Sit, Sit to Supine     Supine to sit: Supervision Sit to supine: Supervision    General bed mobility comments: supervision for safety    Transfers Overall transfer level: Needs assistance Equipment used: Rolling walker (2 wheels) Transfers: Sit to/from Stand, Bed to chair/wheelchair/BSC Sit to Stand: Min assist, Contact guard assist           General transfer comment: Mod demonstrational cueing for hand placement with sit to stand, min A initially down to CGA with x10 serial sit<>stand at end of session    Ambulation/Gait Ambulation/Gait assistance: Contact guard assist Gait Distance (Feet): 75 Feet Assistive device: Rolling walker (2 wheels) Gait Pattern/deviations: Step-through pattern, Decreased stride length Gait velocity: decr     General Gait Details: forward head with neck flexion, cues for looking up, scapular depression, posture and encouragement to maximize distance. heavy reliance on UE support with distance limited as pt stating her arms were "going to give out"   Stairs             Wheelchair Mobility     Tilt Bed    Modified Rankin (Stroke Patients Only)       Balance Overall balance assessment: Needs assistance Sitting-balance support: No upper extremity supported, Feet supported Sitting balance-Leahy Scale: Fair     Standing balance support: Bilateral upper extremity supported, During functional activity, Reliant on assistive device for balance Standing balance-Leahy Scale: Poor Standing balance comment: Pt needs UE support in standing                            Cognition Arousal: Alert Behavior During Therapy: WFL for tasks assessed/performed Overall Cognitive Status: Impaired/Different from baseline Area of Impairment: Memory, Safety/judgement, Problem solving  Memory: Decreased short-term memory   Safety/Judgement: Decreased awareness of deficits   Problem Solving: Slow processing, Requires verbal cues, Difficulty sequencing General Comments: max cues for safety  throughout session with poor recall and carryover        Exercises General Exercises - Lower Extremity Long Arc Quad: AROM, Both, 10 reps, Seated Hip Flexion/Marching: AROM, Both, 20 reps, Seated Other Exercises Other Exercises: serial sit<>stand x10    General Comments        Pertinent Vitals/Pain Pain Assessment Pain Assessment: Faces Faces Pain Scale: Hurts a little bit Pain Location: legs and thumbs Pain Descriptors / Indicators: Sore Pain Intervention(s): Monitored during session, Limited activity within patient's tolerance    Home Living                          Prior Function            PT Goals (current goals can now be found in the care plan section) Acute Rehab PT Goals Patient Stated Goal: return home PT Goal Formulation: With patient Time For Goal Achievement: 01/05/23 Progress towards PT goals: Progressing toward goals    Frequency    Min 1X/week      PT Plan      Co-evaluation              AM-PAC PT "6 Clicks" Mobility   Outcome Measure  Help needed turning from your back to your side while in a flat bed without using bedrails?: A Little Help needed moving from lying on your back to sitting on the side of a flat bed without using bedrails?: A Little Help needed moving to and from a bed to a chair (including a wheelchair)?: A Little Help needed standing up from a chair using your arms (e.g., wheelchair or bedside chair)?: A Little Help needed to walk in hospital room?: A Little Help needed climbing 3-5 steps with a railing? : A Lot 6 Click Score: 17    End of Session Equipment Utilized During Treatment: Gait belt Activity Tolerance: Patient tolerated treatment well;Patient limited by fatigue Patient left: with call bell/phone within reach;in bed;with bed alarm set;with family/visitor present;Other (comment) (with bed in chair position) Nurse Communication: Mobility status PT Visit Diagnosis: Other abnormalities of gait and  mobility (R26.89);Muscle weakness (generalized) (M62.81)     Time: 9528-4132 PT Time Calculation (min) (ACUTE ONLY): 28 min  Charges:    $Gait Training: 8-22 mins $Therapeutic Exercise: 8-22 mins PT General Charges $$ ACUTE PT VISIT: 1 Visit                     Marcena Dias R. PTA Acute Rehabilitation Services Office: [phone]   Catalina Antigua 12/24/2022, 12:20 PM

## 2022-12-24 NOTE — TOC Initial Note (Signed)
Transition of Care [facility]) - Initial/Assessment Note    Patient Details  Name: Rachel Everett MRN: [id number] Date of Birth: May 10, 1960  Transition of Care [facility]) CM/SW Contact:    Janae Bridgeman, RN Phone Number: 12/24/2022, 11:01 AM  Clinical Narrative:                 CM met with the patient and daughter, Rachel Everett at the bedside to discuss TOC needs.  The patient admitted for Nontraumatic stupor with Coma and plans to return home to apartment with daughter when medically stable for discharge.  DME at the home includes [place].   The patient was evaluated by PT/OT and 3:1 and RW were recommended.  Orders placed to be co-signed by the MD.  Medicare choice regarding DME company was offered and the patient did not have a preference.  [employer] was called to deliver RW and 3:1 to the bedside.  I offered home health services and the patient and daughter declined and would like referral placed for OUtpatient rehabilitation services since the patient "did not want providers in the home since the apartment is too small".  Outpatient referral was placed for Outpatient Rehabilitation at the [facility] location - to be co-signed by the MD.  Patient's daughter will provide transportation to home by car.  The daughter states that patient receives food stamps and would like resources to follow up regarding caregiver reimbursement through [facility] - Resource provided to the daughter for [facility] follow up with social services.  No other TOC needs at this time.  Expected Discharge Plan: OP Rehab Barriers to Discharge: Continued Medical Work up   Patient Goals and CMS Choice Patient states their goals for this hospitalization and ongoing recovery are:: To return home CMS Medicare.gov Compare [place] Acute Care list provided to:: Patient Choice offered to / list presented to : Patient [place] ownership interest in [facility].provided to:: Patient    Expected Discharge Plan  and Services   Discharge Planning Services: CM Consult [place] Acute Care Choice: Durable Medical Equipment (patient declined home health - requested Outpatient PT referral) Living arrangements for the past 2 months: Apartment                 DME Arranged: 3-N-1, Walker rolling DME Agency: [employer] Date DME Agency Contacted: 12/24/22 Time DME Agency Contacted: 1059 Representative spoke with at DME Agency: Timothy Lasso, CM with [employer]            Prior Living Arrangements/Services Living arrangements for the past 2 months: Apartment Lives with:: Adult Children (Lives with daughter, Rachel Everett) Patient language and need for interpreter reviewed:: Yes Do you feel safe going back to the place where you live?: Yes      Need for Family Participation in Patient Care: Yes (Comment) Care giver support system in place?: Yes (comment) Current home services: DME [facility]) Criminal Activity/Legal Involvement Pertinent to Current Situation/Hospitalization: No - Comment as needed  Activities of Daily Living      Permission Sought/Granted Permission sought to share information with : Case Manager, Magazine features editor, Family Supports Permission granted to share information with : Yes, Release of Information Signed     Permission granted to share info w AGENCY: [employer] called for RW and 3:1, pt declined HH - preferred Outpatient therapy referral  Permission granted to share info w Relationship: daughter, Rachel Everett     Emotional Assessment Appearance:: Appears stated age Attitude/Demeanor/Rapport: Gracious Affect (typically observed): Accepting Orientation: : Oriented to Self,  Oriented to Place, Oriented to  Time, Oriented to Situation Alcohol / Substance Use: Not Applicable Psych Involvement: No (comment)  Admission diagnosis:  Cardiogenic shock (HCC) [R57.0] Acute hypokalemia [E87.6] 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 [place] [address] Phone: [phone] Fax: [phone]  [facility] - [place] - [address] [place] [address] Phone: [phone] Fax: [phone]     Social Determinants of Health (SDOH) Social History: SDOH Screenings   Depression (PHQ2-9): Medium Risk (01/21/2021)  Tobacco Use: Medium Risk (12/20/2022)   SDOH Interventions:     Readmission Risk Interventions    12/24/2022   11:01 AM  Readmission Risk Prevention Plan  Transportation Screening Complete  PCP or Specialist Appt within 5-7 Days Complete  Home Care Screening Complete  Medication Review (RN CM) Complete

## 2022-12-24 NOTE — Progress Notes (Signed)
PROGRESS NOTE    Khadra Rutt  AOZ:[id number] DOB: February 19, 1961 DOA: 12/20/2022 PCP: Claiborne Rigg, NP   Brief Narrative:  This 62 year old female with multiple medical comorbidities presented in the ED with nausea, vomiting, and weakness. Patient has been off Synthroid for more than a month.  She has also missed at least 4 days of her Lamictal and lithium. Daughter noted decreased activity, always feeling cold and wanting blankets, poor appetite, and more recently difficulty walking and having trouble with her balance.  On arrival she was found to be bradycardic with heart rate in the 40s to 50s, hypotensive with systolic blood pressure in the 70s, hypothermic with temperature of 92.3. She was lethargic but oriented x 3. Significant lab abnormalities in ED,  nonanion gap metabolic acidosis, and mild AKI.  initial TSH was 67.01. She was administered IV fluids, cultures were sent, cortisol sent, free T and free T4 sent, stress dose steroids initiated, and because of her ongoing hypotension,  norepinephrine infusion was started and critical care asked to admit.    Significant events: 10/5 : Admitted, lethargic, heart rate in the 40s to 50s, hypotensive requiring norepinephrine. TSH 67.015, started on norepinephrine, stress dose steroids, loaded with 200 of levothyroxine with plan to continue 100 IV daily admitted to ICU  10/7 TRH assumed care. 10/8 CT abd/pelvis for abdominal pain/nausea.  Negative for acute abnormalities.   Assessment & Plan:   Principal Problem:   Myxedema coma (HCC)   Abdominal Pain  Nausea: CT abdomen and pelvis no acute abnormalities found. Transition synthroid back to IV, continue IV steroids. Continue IV Zofran as needed and pain control.   Shock due to symptomatic bradycardia related to myxedema and electrolyte abnormalities: She remains off pressors now. Continue Oral rehydration Continue to hold beta-blocker will d/c abx.   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  Has a history of noncompliance.   She was Bradycardic, hypothermic , hypotensive, lethargic with TSH almost 70. She received loading dose of levothyroxine, continue 100 mcg daily Follow serial TSH and free T4 Continue stress dose steroids, although normal baseline cortisol level. Will taper quickly.    Acute metabolic encephalopathy: Improving  Hold PTA lithium today.  Restarted Lamictal. PT and OT evaluation. Reorientation. Continue Synthroid Continue delirium precautions   AKI, improving Avoid nephrotoxic medications,  continue to trend.   Hypokalemia: Replaced. Continue to monitor.   Hypophosphatemia, resolved Continue to monitor.   N&V: Continue Antiemetics as needed.   GERD Continue PPI.   UTI Urine culture shows no growth.  Antibiotic discontinued   DVT prophylaxis: Heparin Code Status: Full code Family Communication:  Daughter in room. Disposition Plan:    Status is: Inpatient Remains inpatient appropriate because: Admitted for myxedema, hypothyroidism,     Consultants:  PCCM  Procedures: CT A/P  Antimicrobials: Anti-infectives (From admission, onward)    Start     Dose/Rate Route Frequency Ordered Stop   12/21/22 1400  cephALEXin (KEFLEX) capsule 500 mg  Status:  Discontinued        500 mg Oral Every 8 hours 12/21/22 1008 12/21/22 1011   12/21/22 1400  cephALEXin (KEFLEX) capsule 500 mg  Status:  Discontinued        500 mg Oral Every 12 hours 12/21/22 1011 12/21/22  1011   12/21/22 1400  cephALEXin (KEFLEX) capsule 500 mg  Status:  Discontinued        500 mg Oral Every 12 hours 12/21/22 1011 12/22/22 1802   12/20/22 1400  piperacillin-tazobactam (ZOSYN) IVPB  3.375 g  Status:  Discontinued        3.375 g 12.5 mL/hr over 240 Minutes Intravenous Every 8 hours 12/20/22 1020 12/21/22 1008   12/20/22 1030  vancomycin (VANCOCIN) IVPB 1000 mg/200 mL premix  Status:  Discontinued        1,000 mg 200 mL/hr over 60 Minutes Intravenous Every 24 hours 12/20/22 1020 12/21/22 1008      Subjective: Patient was seen and examined at bedside.  Overnight events noted.   She states she could not sleep in the night,  she is asking for sleeping aid.   Renal functions are improving.  Blood pressure always remains low otherwise feeling better,  She feels  dizzy when she stands up.  Objective: Vitals:   12/23/22 2300 12/24/22 0450 12/24/22 0749 12/24/22 1209  BP: 113/70 109/77 98/69 120/83  Pulse: 66 68 61 64  Resp: 17 17 18 18   Temp: 98.4 F (36.9 C) 97.6 F (36.4 C) 97.8 F (36.6 C) (!) 97.3 F (36.3 C)  TempSrc: Oral Oral Oral Oral  SpO2: 99% 98% 94% 100%  Weight:      Height:        Intake/Output Summary (Last 24 hours) at 12/24/2022 1420 Last data filed at 12/23/2022 1800 Gross per 24 hour  Intake 50 ml  Output --  Net 50 ml   Filed Weights   12/20/22 0702 12/20/22 1143 12/22/22 0610  Weight: 94.4 kg 73 kg 76 kg    Examination:  General exam: Appears calm and comfortable, deconditioned, not in any acute distress. Respiratory system: Clear to auscultation. Respiratory effort normal.  RR 16 Cardiovascular system: S1 & S2 heard, RRR. No JVD, murmurs, rubs, gallops or clicks. No pedal edema. Gastrointestinal system: Abdomen is soft, non distended, nontender. Normal bowel sounds heard. Central nervous system: Alert and oriented x 3. No focal neurological deficits. Extremities: No edema, no cyanosis, no clubbing Skin: No rashes, lesions or ulcers Psychiatry: Judgement and insight appear normal. Mood & affect appropriate.     Data Reviewed: I have personally reviewed following labs and imaging studies  CBC: Recent Labs  Lab 12/20/22 0728  12/20/22 0746 12/20/22 0747 12/20/22 1229 12/21/22 0300 12/23/22 0513 12/24/22 0536  WBC 8.9  --   --  12.8* 10.7* 16.6* 10.8*  NEUTROABS  --   --   --   --   --  13.3* 8.4*  HGB 9.7*   < > 9.5* 11.3* 11.2* 11.1* 11.4*  HCT 30.6*   < > 28.0* 35.4* 33.6* 33.4* 35.2*  MCV 101.0*  --   --  98.6 95.7 93.8 94.1  PLT 158  --   --  227 227 207 227   < > = values in this interval not displayed.   Basic Metabolic Panel: Recent Labs  Lab 12/20/22 0728 12/20/22 0746 12/20/22 0747 12/20/22 0852 12/20/22 1229 12/21/22 0300 12/21/22 1055 12/22/22 0954 12/23/22 0513 12/24/22 0536  NA 139   < > 142  --   --  140 138  --  138 140  K 3.0*   < > 2.8*  --   --  3.7 3.8  --  3.3* 2.9*  CL 111  --  111  --   --  106 107  --  106 107  CO2 18*  --   --   --   --  18* 18*  --  21* 22  GLUCOSE 121*  --  115*  --   --  148* 152*  --  101* 125*  BUN 9  --  8  --   --  <5* 5*  --  7* 9  CREATININE 1.14*  --  1.00  --  1.12* 1.14* 1.24*  --  1.13* 1.24*  CALCIUM 7.3*  --   --   --   --  8.9 9.0  --  9.0 9.2  MG  --   --   --  2.0  --  2.0  --  2.3 2.3 2.2  PHOS  --   --   --  1.9*  --  3.1  --  3.5 2.6 2.9   < > = values in this interval not displayed.   GFR: Estimated Creatinine Clearance: 49 mL/min (A) (by C-G formula based on SCr of 1.24 mg/dL (H)). Liver Function Tests: Recent Labs  Lab 12/20/22 0728 12/23/22 0513 12/24/22 0536  AST 24 22 42*  ALT 21 21 23   ALKPHOS 60 74 82  BILITOT <0.1* 0.5 0.7  PROT 4.8* 5.7* 6.1*  ALBUMIN 2.8* 3.3* 3.6   Recent Labs  Lab 12/20/22 0728 12/23/22 0513  LIPASE 23 25   No results for input(s): "AMMONIA" in the last 168 hours. Coagulation Profile: No results for input(s): "INR", "PROTIME" in the last 168 hours. Cardiac Enzymes: No results for input(s): "CKTOTAL", "CKMB", "CKMBINDEX", "TROPONINI" in the last 168 hours. BNP (last 3 results) No results for input(s): "PROBNP" in the last 8760 hours. HbA1C: No results for input(s): "HGBA1C" in the  last 72 hours. CBG: Recent Labs  Lab 12/20/22 1147  GLUCAP 121*   Lipid Profile: No results for input(s): "CHOL", "HDL", "LDLCALC", "TRIG", "CHOLHDL", "LDLDIRECT" in the last 72 hours. Thyroid Function Tests: Recent Labs    12/24/22 0536  TSH 39.872*  FREET4 0.41*   Anemia Panel: No results for input(s): "VITAMINB12", "FOLATE", "FERRITIN", "TIBC", "IRON", "RETICCTPCT" in the last 72 hours. Sepsis Labs: No results for input(s): "PROCALCITON", "LATICACIDVEN" in the last 168 hours.  Recent Results (from the past 240 hour(s))  Culture, blood (Routine X 2) w Reflex to ID Panel     Status: None (Preliminary result)   Collection Time: 12/20/22 10:13 AM   Specimen: BLOOD  Result Value Ref Range Status   Specimen Description BLOOD SITE NOT SPECIFIED  Final   Special Requests   Final    BOTTLES DRAWN AEROBIC AND ANAEROBIC Blood Culture adequate volume   Culture   Final    NO GROWTH 4 DAYS Performed at [facility], [address], [place], [place] [address]    Report Status PENDING  Incomplete  MRSA Next Gen by PCR, Nasal     Status: None   Collection Time: 12/20/22 11:45 AM   Specimen: Nasal Mucosa; Nasal Swab  Result Value Ref Range Status   MRSA by PCR Next Gen NOT DETECTED NOT DETECTED Final    Comment: (NOTE) The GeneXpert MRSA Assay (FDA approved for NASAL specimens only), is one component of a comprehensive MRSA colonization surveillance program. It is not intended to diagnose MRSA infection nor to guide or monitor treatment for MRSA infections. Test performance is not FDA approved in patients less than 42 years old. Performed at [facility], [address], [place], [place] [address]  Culture, blood (Routine X 2) w Reflex to ID Panel     Status: None (Preliminary result)   Collection Time: 12/20/22 12:29 PM   Specimen: BLOOD LEFT ARM  Result Value Ref Range Status   Specimen Description BLOOD LEFT ARM  Final   Special Requests   Final    BOTTLES DRAWN  AEROBIC ONLY Blood Culture adequate volume   Culture   Final    NO GROWTH 4 DAYS Performed at [facility], [address] [place], [place] [address]    Report Status PENDING  Incomplete  Urine Culture (for pregnant, neutropenic or urologic patients or patients with an indwelling urinary catheter)     Status: None   Collection Time: 12/21/22  1:37 PM   Specimen: Urine, Clean Catch  Result Value Ref Range Status   Specimen Description URINE, CLEAN CATCH  Final   Special Requests NONE  Final   Culture   Final    NO GROWTH Performed at [facility], [address], [place], [place] [address]    Report Status 12/22/2022 FINAL  Final    Radiology Studies: CT ABDOMEN PELVIS W CONTRAST  Result Date: 12/23/2022 CLINICAL DATA:  Acute nonlocalized abdominal pain EXAM: CT ABDOMEN AND PELVIS WITH CONTRAST TECHNIQUE: Multidetector CT imaging of the abdomen and pelvis was performed using the standard protocol following bolus administration of intravenous contrast. RADIATION DOSE REDUCTION: This exam was performed according to the departmental dose-optimization program which includes automated exposure control, adjustment of the mA and/or kV according to patient size and/or use of iterative reconstruction technique. CONTRAST:  75mL OMNIPAQUE IOHEXOL 350 MG/ML SOLN COMPARISON:  12/20/2022 FINDINGS: Lower chest: Small bilateral pleural effusions. No acute abnormality. Small hiatal hernia. Hepatobiliary: Moderate hepatic steatosis with superimposed focal fatty hepatic infiltration along the falciform ligament. No enhancing intrahepatic mass. No intra or extrahepatic biliary ductal dilation. Gallbladder unremarkable. Pancreas: Unremarkable Spleen: Unremarkable Adrenals/Urinary Tract: Unremarkable Stomach/Bowel: Stomach is within normal limits. Appendix appears normal. No evidence of bowel wall thickening, distention, or inflammatory changes. Vascular/Lymphatic: Aortic atherosclerosis. No enlarged  abdominal or pelvic lymph nodes. Reproductive: Uterus and bilateral adnexa are unremarkable. Other: Mild diffuse subcutaneous body wall edema. Musculoskeletal: Osseous structures are age-appropriate. No acute bone abnormality. No lytic or blastic bone lesion. IMPRESSION: 1. No acute intra-abdominal pathology identified. No definite radiographic explanation for the patient's reported symptoms. 2. Small bilateral pleural effusions. 3. Moderate hepatic steatosis. 4. Small hiatal hernia. 5. Mild diffuse subcutaneous body wall edema. Aortic Atherosclerosis (ICD10-I70.0). Electronically Signed   By: Helyn Numbers M.D.   On: 12/23/2022 22:33    Scheduled Meds:  heparin  5,000 Units Subcutaneous Q8H   hydrocortisone sodium succinate  50 mg Intravenous Q12H   hydrOXYzine  50 mg Oral BID   lamoTRIgine  200 mg Oral BID   levothyroxine  100 mcg Intravenous Q0600   pantoprazole  40 mg Oral BID   Continuous Infusions:  promethazine (PHENERGAN) injection (IM or IVPB) 12.5 mg (12/23/22 1701)     LOS: 4 days    Time spent: 50 mins    Willeen Niece, MD [facility]   If 7PM-7AM, please contact night-coverage

## 2022-12-25 ENCOUNTER — Inpatient Hospital Stay (HOSPITAL_COMMUNITY): Payer: Medicare HMO

## 2022-12-25 DIAGNOSIS — E035 Myxedema coma: Secondary | ICD-10-CM | POA: Diagnosis not present

## 2022-12-25 LAB — BASIC METABOLIC PANEL
Anion gap: 9 (ref 5–15)
BUN: 7 mg/dL — ABNORMAL LOW (ref 8–23)
CO2: 22 mmol/L (ref 22–32)
Calcium: 8.7 mg/dL — ABNORMAL LOW (ref 8.9–10.3)
Chloride: 110 mmol/L (ref 98–111)
Creatinine, Ser: 1.17 mg/dL — ABNORMAL HIGH (ref 0.44–1.00)
GFR, Estimated: 53 mL/min — ABNORMAL LOW (ref >60)
Glucose, Bld: 82 mg/dL (ref 70–99)
Potassium: 3 mmol/L — ABNORMAL LOW (ref 3.5–5.1)
Sodium: 141 mmol/L (ref 135–145)

## 2022-12-25 LAB — CBC
HCT: 31.7 % — ABNORMAL LOW (ref 36.0–46.0)
Hemoglobin: 10.5 g/dL — ABNORMAL LOW (ref 12.0–15.0)
MCH: 31 pg (ref 26.0–34.0)
MCHC: 33.1 g/dL (ref 30.0–36.0)
MCV: 93.5 fL (ref 80.0–100.0)
Platelets: 205 10*3/uL (ref 150–400)
RBC: 3.39 MIL/uL — ABNORMAL LOW (ref 3.87–5.11)
RDW: 14.3 % (ref 11.5–15.5)
WBC: 9.5 10*3/uL (ref 4.0–10.5)
nRBC: 0 % (ref 0.0–0.2)

## 2022-12-25 LAB — CULTURE, BLOOD (ROUTINE X 2)
Culture: NO GROWTH
Culture: NO GROWTH
Special Requests: ADEQUATE
Special Requests: ADEQUATE

## 2022-12-25 MED ORDER — POTASSIUM CHLORIDE 20 MEQ PO PACK
40.0000 meq | PACK | Freq: Once | ORAL | Status: AC
  Administered 2022-12-25: 40 meq via ORAL
  Filled 2022-12-25: qty 2

## 2022-12-25 MED ORDER — SENNOSIDES-DOCUSATE SODIUM 8.6-50 MG PO TABS
1.0000 | ORAL_TABLET | Freq: Two times a day (BID) | ORAL | Status: DC
  Administered 2022-12-25 – 2022-12-27 (×4): 1 via ORAL
  Filled 2022-12-25 (×4): qty 1

## 2022-12-25 NOTE — Progress Notes (Signed)
PT Cancellation Note  Patient Details Name: Rinda Rollyson MRN: [id number] DOB: 1960/05/30   Cancelled Treatment:    Reason Eval/Treat Not Completed: (P) Medical issues which prohibited therapy, pt declining mobility with c/o nausea and reporting emesis throughout day, RN aware. Will check back as schedule allows to continue with PT POC.  Lenora Boys. PTA Acute Rehabilitation Services Office: [phone]   Catalina Antigua 12/25/2022, 1:38 PM

## 2022-12-25 NOTE — Progress Notes (Signed)
PROGRESS NOTE    Rachel Everett  AOZ:[id number] DOB: 1960/04/14 DOA: 12/20/2022 PCP: Claiborne Rigg, NP   Brief Narrative:  This 62 year old female with multiple medical comorbidities presented in the ED with nausea, vomiting, and weakness. Patient has been off Synthroid for more than a month.  She has also missed at least 4 days of her Lamictal and lithium. Daughter noted decreased activity, always feeling cold and wanting blankets, poor appetite, and more recently difficulty walking and having trouble with her balance.  On arrival she was found to be bradycardic with heart rate in the 40s to 50s, hypotensive with systolic blood pressure in the 70s, hypothermic with temperature of 92.3. She was lethargic but oriented x 3. Significant lab abnormalities in ED,  nonanion gap metabolic acidosis, and mild AKI.  initial TSH was 67.01. She was administered IV fluids, cultures were sent, cortisol sent, free T and free T4 sent, stress dose steroids initiated, and because of her ongoing hypotension,  norepinephrine infusion was started and critical care asked to admit.    Significant events: 10/5 : Admitted, lethargic, heart rate in the 40s to 50s, hypotensive requiring norepinephrine. TSH 67.015, started on norepinephrine, stress dose steroids, loaded with 200 of levothyroxine with plan to continue 100 IV daily admitted to ICU  10/7 [facility] assumed care. 10/8 CT abd/pelvis for abdominal pain/nausea.  Negative for acute abnormalities.   Assessment & Plan:   Principal Problem:   Myxedema coma (HCC)   Abdominal Pain  Nausea: CT abdomen and pelvis > No acute abnormalities found. Transition synthroid back to IV, continue IV steroids. Continue IV Zofran as needed and pain control. She reports abdominal pain improved.   Shock due to symptomatic bradycardia related to myxedema and electrolyte abnormalities: She remains off pressors now. Continue Oral rehydration Continue to hold beta-blocker Discontinue  Antibiotics.   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 appreciate cardiology assistance - see 10/5 note   Myxedema coma w/ hypothermia: Has not taking her medications for several weeks.  Has a history of noncompliance.   She was Bradycardic, hypothermic , hypotensive, lethargic with TSH almost 70. She received loading dose of levothyroxine, continue 100 mcg daily Follow serial TSH and free T4 Continue stress dose steroids, although normal baseline cortisol level. Will taper quickly.    Acute metabolic encephalopathy: Improving  Hold PTA lithium today.  Restarted Lamictal. PT and OT evaluation. Reorientation. Continue Synthroid Continue delirium precautions   AKI, improving Avoid nephrotoxic medications,  continue to trend.   Hypokalemia: Replaced. Continue to monitor.   Hypophosphatemia, resolved Continue to monitor.   N&V: Continue Antiemetics as needed.   GERD Continue PPI.   UTI Urine culture shows no growth.  Antibiotic discontinued   DVT prophylaxis: Heparin Code Status: Full code Family Communication:  Daughter in room. Disposition Plan:    Status is: Inpatient Remains inpatient appropriate because: Admitted for myxedema, hypothyroidism,     Consultants:  PCCM  Procedures: CT A/P  Antimicrobials: Anti-infectives (From admission, onward)    Start     Dose/Rate Route Frequency Ordered Stop   12/21/22 1400  cephALEXin (KEFLEX) capsule 500 mg  Status:  Discontinued        500 mg Oral Every 8 hours 12/21/22 1008 12/21/22 1011   12/21/22 1400  cephALEXin (KEFLEX) capsule 500 mg  Status:  Discontinued        500 mg Oral Every  12 hours 12/21/22 1011 12/21/22 1011   12/21/22 1400  cephALEXin (KEFLEX) capsule 500 mg  Status:  Discontinued        500 mg Oral Every 12 hours 12/21/22 1011 12/22/22 1802    12/20/22 1400  piperacillin-tazobactam (ZOSYN) IVPB 3.375 g  Status:  Discontinued        3.375 g 12.5 mL/hr over 240 Minutes Intravenous Every 8 hours 12/20/22 1020 12/21/22 1008   12/20/22 1030  vancomycin (VANCOCIN) IVPB 1000 mg/200 mL premix  Status:  Discontinued        1,000 mg 200 mL/hr over 60 Minutes Intravenous Every 24 hours 12/20/22 1020 12/21/22 1008      Subjective: Patient was seen and examined at bedside.  Overnight events noted.   Patient reports she slept well last night and feeling better. Renal functions are improving.  Blood pressure always remains low otherwise feeling better,    Objective: Vitals:   12/24/22 1900 12/24/22 2313 12/25/22 0500 12/25/22 0725  BP: 113/70 117/84 (!) 122/30 127/82  Pulse: 72 66 72 62  Resp: 17 17 17 16   Temp: 97.6 F (36.4 C) 98 F (36.7 C) 97.6 F (36.4 C) 97.8 F (36.6 C)  TempSrc: Oral Oral Oral Oral  SpO2: 95% 95% 96% 95%  Weight:   73.4 kg   Height:       No intake or output data in the 24 hours ending 12/25/22 1257  Filed Weights   12/20/22 1143 12/22/22 0610 12/25/22 0500  Weight: 73 kg 76 kg 73.4 kg    Examination:  General exam: Appears calm and comfortable, deconditioned, not in any acute distress. Respiratory system: CTA bilaterally. Respiratory effort normal.  RR 14 Cardiovascular system: S1 & S2 heard, RRR. No JVD, murmurs, rubs, gallops or clicks. No pedal edema. Gastrointestinal system: Abdomen is soft, non distended, nontender. Normal bowel sounds heard. Central nervous system: Alert and oriented x 3. No focal neurological deficits. Extremities: No edema, no cyanosis, no clubbing Skin: No rashes, lesions or ulcers Psychiatry: Judgement and insight appear normal. Mood & affect appropriate.     Data Reviewed: I have personally reviewed following labs and imaging studies  CBC: Recent Labs  Lab 12/20/22 1229 12/21/22 0300 12/23/22 0513 12/24/22 0536 12/25/22 0812  WBC 12.8* 10.7* 16.6* 10.8* 9.5   NEUTROABS  --   --  13.3* 8.4*  --   HGB 11.3* 11.2* 11.1* 11.4* 10.5*  HCT 35.4* 33.6* 33.4* 35.2* 31.7*  MCV 98.6 95.7 93.8 94.1 93.5  PLT 227 227 207 227 205   Basic Metabolic Panel: Recent Labs  Lab 12/20/22 0852 12/20/22 1229 12/21/22 0300 12/21/22 1055 12/22/22 0954 12/23/22 0513 12/24/22 0536 12/25/22 0812  NA  --   --  140 138  --  138 140 141  K  --   --  3.7 3.8  --  3.3* 2.9* 3.0*  CL  --   --  106 107  --  106 107 110  CO2  --   --  18* 18*  --  21* 22 22  GLUCOSE  --   --  148* 152*  --  101* 125* 82  BUN  --   --  <5* 5*  --  7* 9 7*  CREATININE  --    < > 1.14* 1.24*  --  1.13* 1.24* 1.17*  CALCIUM  --   --  8.9 9.0  --  9.0 9.2 8.7*  MG 2.0  --  2.0  --  2.3  2.3 2.2  --   PHOS 1.9*  --  3.1  --  3.5 2.6 2.9  --    < > = values in this interval not displayed.   GFR: Estimated Creatinine Clearance: 51.1 mL/min (A) (by C-G formula based on SCr of 1.17 mg/dL (H)). Liver Function Tests: Recent Labs  Lab 12/20/22 0728 12/23/22 0513 12/24/22 0536  AST 24 22 42*  ALT 21 21 23   ALKPHOS 60 74 82  BILITOT <0.1* 0.5 0.7  PROT 4.8* 5.7* 6.1*  ALBUMIN 2.8* 3.3* 3.6   Recent Labs  Lab 12/20/22 0728 12/23/22 0513  LIPASE 23 25   No results for input(s): "AMMONIA" in the last 168 hours. Coagulation Profile: No results for input(s): "INR", "PROTIME" in the last 168 hours. Cardiac Enzymes: No results for input(s): "CKTOTAL", "CKMB", "CKMBINDEX", "TROPONINI" in the last 168 hours. BNP (last 3 results) No results for input(s): "PROBNP" in the last 8760 hours. HbA1C: No results for input(s): "HGBA1C" in the last 72 hours. CBG: Recent Labs  Lab 12/20/22 1147  GLUCAP 121*   Lipid Profile: No results for input(s): "CHOL", "HDL", "LDLCALC", "TRIG", "CHOLHDL", "LDLDIRECT" in the last 72 hours. Thyroid Function Tests: Recent Labs    12/24/22 0536  TSH 39.872*  FREET4 0.41*   Anemia Panel: No results for input(s): "VITAMINB12", "FOLATE", "FERRITIN",  "TIBC", "IRON", "RETICCTPCT" in the last 72 hours. Sepsis Labs: No results for input(s): "PROCALCITON", "LATICACIDVEN" in the last 168 hours.  Recent Results (from the past 240 hour(s))  Culture, blood (Routine X 2) w Reflex to ID Panel     Status: None (Preliminary result)   Collection Time: 12/20/22 10:13 AM   Specimen: BLOOD  Result Value Ref Range Status   Specimen Description BLOOD SITE NOT SPECIFIED  Final   Special Requests   Final    BOTTLES DRAWN AEROBIC AND ANAEROBIC Blood Culture adequate volume   Culture   Final    NO GROWTH 4 DAYS Performed at [facility], [address], [place], [place] [address]    Report Status PENDING  Incomplete  MRSA Next Gen by PCR, Nasal     Status: None   Collection Time: 12/20/22 11:45 AM   Specimen: Nasal Mucosa; Nasal Swab  Result Value Ref Range Status   MRSA by PCR Next Gen NOT DETECTED NOT DETECTED Final    Comment: (NOTE) The GeneXpert MRSA Assay (FDA approved for NASAL specimens only), is one component of a comprehensive MRSA colonization surveillance program. It is not intended to diagnose MRSA infection nor to guide or monitor treatment for MRSA infections. Test performance is not FDA approved in patients less than 11 years old. Performed at [facility], [address] [place], [place] [address]   Culture, blood (Routine X 2) w Reflex to ID Panel     Status: None (Preliminary result)   Collection Time: 12/20/22 12:29 PM   Specimen: BLOOD LEFT ARM  Result Value Ref Range Status   Specimen Description BLOOD LEFT ARM  Final   Special Requests   Final    BOTTLES DRAWN AEROBIC ONLY Blood Culture adequate volume   Culture   Final    NO GROWTH 4 DAYS Performed at [facility], [address], [place], [place] [address]    Report Status PENDING  Incomplete  Urine Culture (for pregnant, neutropenic or urologic patients or patients with an indwelling urinary catheter)     Status: None   Collection Time: 12/21/22  1:37 PM   Specimen: Urine, Clean Catch  Result Value Ref Range Status   Specimen Description URINE, CLEAN CATCH  Final   Special Requests NONE  Final   Culture   Final    NO GROWTH Performed at [facility], [address] [place], [place] [address]    Report Status 12/22/2022 FINAL  Final    Radiology Studies: CT ABDOMEN PELVIS W CONTRAST  Result Date: 12/23/2022 CLINICAL DATA:  Acute nonlocalized abdominal pain EXAM: CT ABDOMEN AND PELVIS WITH CONTRAST TECHNIQUE: Multidetector CT imaging of the abdomen and pelvis was performed using the standard protocol following bolus administration of intravenous contrast. RADIATION DOSE REDUCTION: This exam was performed according to the departmental dose-optimization program which includes automated exposure control, adjustment of the mA and/or kV according to patient size and/or use of iterative reconstruction technique. CONTRAST:  75mL OMNIPAQUE IOHEXOL 350 MG/ML SOLN COMPARISON:  12/20/2022 FINDINGS: Lower chest: Small bilateral pleural effusions. No acute abnormality. Small hiatal hernia. Hepatobiliary: Moderate hepatic steatosis with superimposed focal fatty hepatic infiltration along the falciform ligament. No enhancing intrahepatic mass. No intra or extrahepatic biliary ductal dilation. Gallbladder unremarkable. Pancreas: Unremarkable Spleen: Unremarkable Adrenals/Urinary Tract: Unremarkable Stomach/Bowel: Stomach is within normal limits. Appendix appears normal. No evidence of bowel wall thickening, distention, or inflammatory changes. Vascular/Lymphatic: Aortic atherosclerosis. No enlarged abdominal or pelvic lymph nodes. Reproductive: Uterus and bilateral adnexa are unremarkable. Other: Mild diffuse subcutaneous body wall edema. Musculoskeletal: Osseous structures are age-appropriate. No acute bone abnormality. No lytic or blastic bone lesion. IMPRESSION: 1. No acute intra-abdominal pathology identified. No definite radiographic explanation for  the patient's reported symptoms. 2. Small bilateral pleural effusions. 3. Moderate hepatic steatosis. 4. Small hiatal hernia. 5. Mild diffuse subcutaneous body wall edema. Aortic Atherosclerosis (ICD10-I70.0). Electronically Signed   By: Helyn Numbers M.D.   On: 12/23/2022 22:33    Scheduled Meds:  heparin  5,000 Units Subcutaneous Q8H   hydrocortisone sodium succinate  50 mg Intravenous Q12H   hydrOXYzine  50 mg Oral BID   lamoTRIgine  200 mg Oral BID   levothyroxine  100 mcg Intravenous Q0600   pantoprazole  40 mg Oral BID   potassium chloride  40 mEq Oral Once   senna-docusate  1 tablet Oral BID   Continuous Infusions:  promethazine (PHENERGAN) injection (IM or IVPB) 12.5 mg (12/23/22 1701)     LOS: 5 days    Time spent: 35 mins    Willeen Niece, MD [facility]   If 7PM-7AM, please contact night-coverage

## 2022-12-26 DIAGNOSIS — E035 Myxedema coma: Secondary | ICD-10-CM | POA: Diagnosis not present

## 2022-12-26 MED ORDER — HYDROCORTISONE 10 MG PO TABS: 10.0000 mg | ORAL_TABLET | Freq: Every day | ORAL | Status: DC

## 2022-12-26 MED ORDER — POTASSIUM CHLORIDE 20 MEQ PO PACK: 40.0000 meq | PACK | Freq: Once | ORAL | Status: DC

## 2022-12-26 MED ORDER — HYDROCORTISONE 20 MG PO TABS: 20.0000 mg | ORAL_TABLET | Freq: Every day | ORAL | Status: DC

## 2022-12-26 MED ORDER — HYDROCORTISONE 20 MG PO TABS
50.0000 mg | ORAL_TABLET | Freq: Every day | ORAL | Status: AC
  Administered 2022-12-26 – 2022-12-27 (×2): 50 mg via ORAL
  Filled 2022-12-26 (×2): qty 1

## 2022-12-26 MED ORDER — POTASSIUM CHLORIDE CRYS ER 20 MEQ PO TBCR
40.0000 meq | EXTENDED_RELEASE_TABLET | Freq: Two times a day (BID) | ORAL | Status: AC
  Administered 2022-12-26 (×2): 40 meq via ORAL
  Filled 2022-12-26 (×2): qty 2

## 2022-12-26 MED ORDER — ALPRAZOLAM 0.5 MG PO TABS
0.5000 mg | ORAL_TABLET | Freq: Two times a day (BID) | ORAL | Status: DC | PRN
  Administered 2022-12-26 – 2022-12-27 (×2): 0.5 mg via ORAL
  Filled 2022-12-26 (×2): qty 1

## 2022-12-26 MED ORDER — LEVOTHYROXINE SODIUM 75 MCG PO TABS
150.0000 ug | ORAL_TABLET | Freq: Every day | ORAL | Status: DC
  Administered 2022-12-27: 150 ug via ORAL
  Filled 2022-12-26: qty 2

## 2022-12-26 NOTE — Progress Notes (Signed)
PT Cancellation Note  Patient Details Name: Rachel Everett MRN: [id number] DOB: September 09, 1960   Cancelled Treatment:    Reason Eval/Treat Not Completed: Patient declined, no reason specified (pt refusing mobility even option of scooting up in bed and slowly raising bed. PT stated nausea yesterday, phenergan today and fear of emesis with refusal to attempt any activity at this time. Pt educated for importance of mobility for return home and pt reports understanding. Encouraged OOB with nursing staff.    Enedina Finner Annalycia Done 12/26/2022, 10:14 AM Merryl Hacker, PT Acute Rehabilitation Services Office: [phone]

## 2022-12-26 NOTE — H&P (Signed)
Rx asked to change IV levothyroxine to PO. Pt has a hx of non-compliance. His free T4 was still low 2 days ago. We will change IV to qday. Taper hydrocortisone with 50mg  qday x2 days then 20mg  qday x1 then 10mg  x1.  Ulyses Southward, PharmD, BCIDP, AAHIVP, CPP Infectious Disease Pharmacist 12/26/2022 8:36 AM

## 2022-12-26 NOTE — Progress Notes (Signed)
PROGRESS NOTE    Nely Furrow  OZH:[id number] DOB: [birth year] DOA: 12/20/2022 PCP: Claiborne Rigg, NP   Brief Narrative:  This 62 year old female with multiple medical comorbidities presented in the ED with nausea, vomiting, and weakness. Patient has been off Synthroid for more than a month.  She has also missed at least 4 days of her Lamictal and lithium. Daughter noted decreased activity, always feeling cold and wanting blankets, poor appetite, and more recently difficulty walking and having trouble with her balance.  On arrival she was found to be bradycardic with heart rate in the 40s to 50s, hypotensive with systolic blood pressure in the 70s, hypothermic with temperature of 92.3. She was lethargic but oriented x 3. Significant lab abnormalities in ED,  nonanion gap metabolic acidosis, and mild AKI.  initial TSH was 67.01. She was administered IV fluids, cultures were sent, cortisol sent, free T and free T4 sent, stress dose steroids initiated, and because of her ongoing hypotension,  norepinephrine infusion was started and critical care asked to admit.    Significant events: 10/5 : Admitted, lethargic, heart rate in the 40s to 50s, hypotensive requiring norepinephrine. TSH 67.015, started on norepinephrine, stress dose steroids, loaded with 200 of levothyroxine with plan to continue 100 IV daily admitted to ICU  10/7 TR[facility] assumed care. 10/8 CT abd/pelvis for abdominal pain/nausea.  Negative for acute abnormalities.   Assessment & Plan:   Principal Problem:   Myxedema coma (HCC)   Abdominal Pain  Nausea: CT abdomen and pelvis > No acute abnormalities found. Synthroid IV changed to oral, start tapering steroids. Continue IV Zofran as needed and pain control. She reports abdominal pain has improved.   Shock due to symptomatic bradycardia related to myxedema and electrolyte abnormalities: She remains off pressors now. Continue Oral rehydration Continue to hold  beta-blocker Discontinue Antibiotics.   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 appreciate cardiology assistance - see 10/5 note   Myxedema coma w/ hypothermia: Has not taking her medications for several weeks.  Has a history of noncompliance.   She was Bradycardic, hypothermic , hypotensive, lethargic with TSH almost 70. She received loading dose of levothyroxine, continue 100 mcg daily Follow serial TSH and free T4 Start tapering steroids.   Acute metabolic encephalopathy: Improving  Hold PTA lithium today.  Restarted Lamictal. PT and OT evaluation. Reorientation. Continue Synthroid Continue delirium precautions   AKI, improving Avoid nephrotoxic medications,  continue to trend.   Hypokalemia: Replaced. Continue to monitor.   Hypophosphatemia, resolved Continue to monitor.   N&V: Continue Antiemetics as needed.   GERD Continue PPI.   UTI Urine culture shows no growth.  Antibiotic discontinued   DVT prophylaxis: Heparin Code Status: Full code Family Communication:  Daughter in room. Disposition Plan:    Status is: Inpatient Remains inpatient appropriate because: Admitted for myxedema, hypothyroidism,     Consultants:  PCCM  Procedures: CT A/P  Antimicrobials: Anti-infectives (From admission, onward)    Start     Dose/Rate Route Frequency Ordered Stop   12/21/22 1400  cephALEXin (KEFLEX) capsule 500 mg  Status:  Discontinued        500 mg Oral Every 8 hours 12/21/22 1008 12/21/22 1011   12/21/22 1400  cephALEXin (KEFLEX) capsule 500 mg  Status:  Discontinued        500 mg Oral Every 12 hours 12/21/22 1011 12/21/22 1011   12/21/22  1400  cephALEXin (KEFLEX) capsule 500 mg  Status:  Discontinued        500 mg Oral Every 12 hours 12/21/22 1011 12/22/22 1802   12/20/22 1400  piperacillin-tazobactam  (ZOSYN) IVPB 3.375 g  Status:  Discontinued        3.375 g 12.5 mL/hr over 240 Minutes Intravenous Every 8 hours 12/20/22 1020 12/21/22 1008   12/20/22 1030  vancomycin (VANCOCIN) IVPB 1000 mg/200 mL premix  Status:  Discontinued        1,000 mg 200 mL/hr over 60 Minutes Intravenous Every 24 hours 12/20/22 1020 12/21/22 1008      Subjective: Patient was seen and examined at bedside.  Overnight events noted.   Patient reports she is not feeling well today,  still feeling nauseous. Renal functions are improving.  Blood pressure always remains low otherwise feeling better,    Objective: Vitals:   12/25/22 0725 12/25/22 1621 12/26/22 0628 12/26/22 0853  BP: 127/82 103/64 107/67 104/65  Pulse: 62 66 62 60  Resp: 16 17  17   Temp: 97.8 F (36.6 C) 98.1 F (36.7 C) 98.6 F (37 C) 98.3 F (36.8 C)  TempSrc: Oral Oral Oral Oral  SpO2: 95% 99% 93% 94%  Weight:      Height:       No intake or output data in the 24 hours ending 12/26/22 1245  Filed Weights   12/20/22 1143 12/22/22 0610 12/25/22 0500  Weight: 73 kg 76 kg 73.4 kg    Examination:  General exam: Appears calm and comfortable, deconditioned, not in any acute distress. Respiratory system: CTA bilaterally. Respiratory effort normal.  RR 15 Cardiovascular system: S1 & S2 heard, RRR. No JVD, murmurs, rubs, gallops or clicks. No pedal edema. Gastrointestinal system: Abdomen is soft, non distended, nontender. Normal bowel sounds heard. Central nervous system: Alert and oriented x 3. No focal neurological deficits. Extremities: No edema, no cyanosis, no clubbing Skin: No rashes, lesions or ulcers Psychiatry: Judgement and insight appear normal. Mood & affect appropriate.     Data Reviewed: I have personally reviewed following labs and imaging studies  CBC: Recent Labs  Lab 12/20/22 1229 12/21/22 0300 12/23/22 0513 12/24/22 0536 12/25/22 0812  WBC 12.8* 10.7* 16.6* 10.8* 9.5  NEUTROABS  --   --  13.3* 8.4*  --    HGB 11.3* 11.2* 11.1* 11.4* 10.5*  HCT 35.4* 33.6* 33.4* 35.2* 31.7*  MCV 98.6 95.7 93.8 94.1 93.5  PLT 227 227 207 227 205   Basic Metabolic Panel: Recent Labs  Lab 12/20/22 0852 12/20/22 1229 12/21/22 0300 12/21/22 1055 12/22/22 0954 12/23/22 0513 12/24/22 0536 12/25/22 0812  NA  --   --  140 138  --  138 140 141  K  --   --  3.7 3.8  --  3.3* 2.9* 3.0*  CL  --   --  106 107  --  106 107 110  CO2  --   --  18* 18*  --  21* 22 22  GLUCOSE  --   --  148* 152*  --  101* 125* 82  BUN  --   --  <5* 5*  --  7* 9 7*  CREATININE  --    < > 1.14* 1.24*  --  1.13* 1.24* 1.17*  CALCIUM  --   --  8.9 9.0  --  9.0 9.2 8.7*  MG 2.0  --  2.0  --  2.3 2.3 2.2  --   PHOS 1.9*  --  3.1  --  3.5 2.6 2.9  --    < > = values in this interval not displayed.   GFR: Estimated Creatinine Clearance: 51.1 mL/min (A) (by C-G formula based on SCr of 1.17 mg/dL (H)). Liver Function Tests: Recent Labs  Lab 12/20/22 0728 12/23/22 0513 12/24/22 0536  AST 24 22 42*  ALT 21 21 23   ALKPHOS 60 74 82  BILITOT <0.1* 0.5 0.7  PROT 4.8* 5.7* 6.1*  ALBUMIN 2.8* 3.3* 3.6   Recent Labs  Lab 12/20/22 0728 12/23/22 0513  LIPASE 23 25   No results for input(s): "AMMONIA" in the last 168 hours. Coagulation Profile: No results for input(s): "INR", "PROTIME" in the last 168 hours. Cardiac Enzymes: No results for input(s): "CKTOTAL", "CKMB", "CKMBINDEX", "TROPONINI" in the last 168 hours. BNP (last 3 results) No results for input(s): "PROBNP" in the last 8760 hours. HbA1C: No results for input(s): "HGBA1C" in the last 72 hours. CBG: Recent Labs  Lab 12/20/22 1147  GLUCAP 121*   Lipid Profile: No results for input(s): "CHOL", "HDL", "LDLCALC", "TRIG", "CHOLHDL", "LDLDIRECT" in the last 72 hours. Thyroid Function Tests: Recent Labs    12/24/22 0536  TSH 39.872*  FREET4 0.41*   Anemia Panel: No results for input(s): "VITAMINB12", "FOLATE", "FERRITIN", "TIBC", "IRON", "RETICCTPCT" in the last  72 hours. Sepsis Labs: No results for input(s): "PROCALCITON", "LATICACIDVEN" in the last 168 hours.  Recent Results (from the past 240 hour(s))  Culture, blood (Routine X 2) w Reflex to ID Panel     Status: None   Collection Time: 12/20/22 10:13 AM   Specimen: BLOOD  Result Value Ref Range Status   Specimen Description BLOOD SITE NOT SPECIFIED  Final   Special Requests   Final    BOTTLES DRAWN AEROBIC AND ANAEROBIC Blood Culture adequate volume   Culture   Final    NO GROWTH 5 DAYS Performed at [facility], [address], [place], [place] [address]    Report Status 12/25/2022 FINAL  Final  MRSA Next Gen by PCR, Nasal     Status: None   Collection Time: 12/20/22 11:45 AM   Specimen: Nasal Mucosa; Nasal Swab  Result Value Ref Range Status   MRSA by PCR Next Gen NOT DETECTED NOT DETECTED Final    Comment: (NOTE) The GeneXpert MRSA Assay (FDA approved for NASAL specimens only), is one component of a comprehensive MRSA colonization surveillance program. It is not intended to diagnose MRSA infection nor to guide or monitor treatment for MRSA infections. Test performance is not FDA approved in patients less than 23 years old. Performed at [facility], [address], [place], [place] [address]   Culture, blood (Routine X 2) w Reflex to ID Panel     Status: None   Collection Time: 12/20/22 12:29 PM   Specimen: BLOOD LEFT ARM  Result Value Ref Range Status   Specimen Description BLOOD LEFT ARM  Final   Special Requests   Final    BOTTLES DRAWN AEROBIC ONLY Blood Culture adequate volume   Culture   Final    NO GROWTH 5 DAYS Performed at [facility], [address] [place], [place] [address]    Report Status 12/25/2022 FINAL  Final  Urine Culture (for pregnant, neutropenic or urologic patients or patients with an indwelling urinary catheter)     Status: None   Collection Time: 12/21/22  1:37 PM   Specimen: Urine, Clean Catch  Result Value Ref Range Status  Specimen Description URINE, CLEAN CATCH  Final   Special Requests NONE  Final   Culture   Final    NO GROWTH Performed at [facility], [address], [place], [place] [address]    Report Status 12/22/2022 FINAL  Final    Radiology Studies: DG Abd 1 View  Result Date: 12/25/2022 CLINICAL DATA:  Abdominal distension. EXAM: ABDOMEN - 1 VIEW COMPARISON:  December 23, 2022. FINDINGS: No abnormal bowel dilatation is noted. Mild amount of stool seen throughout the colon. Status post cholecystectomy. No radio-opaque calculi or other significant radiographic abnormality are seen. IMPRESSION: No abnormal bowel dilatation. Electronically Signed   By: Lupita Raider M.D.   On: 12/25/2022 16:27    Scheduled Meds:  heparin  5,000 Units Subcutaneous Q8H   hydrocortisone  50 mg Oral Daily   Followed by   Melene Muller ON 12/28/2022] hydrocortisone  20 mg Oral Daily   Followed by   Melene Muller ON 12/30/2022] hydrocortisone  10 mg Oral Daily   hydrOXYzine  50 mg Oral BID   lamoTRIgine  200 mg Oral BID   [START ON 12/27/2022] levothyroxine  150 mcg Oral Q0600   pantoprazole  40 mg Oral BID   potassium chloride  40 mEq Oral BID   senna-docusate  1 tablet Oral BID   Continuous Infusions:  promethazine (PHENERGAN) injection (IM or IVPB) 12.5 mg (12/26/22 0809)     LOS: 6 days    Time spent: 35 mins    Willeen Niece, MD [facility]   If 7PM-7AM, please contact night-coverage

## 2022-12-26 NOTE — Plan of Care (Signed)

## 2022-12-27 DIAGNOSIS — E035 Myxedema coma: Secondary | ICD-10-CM | POA: Diagnosis not present

## 2022-12-27 LAB — CBC
HCT: 38 % (ref 36.0–46.0)
Hemoglobin: 12.5 g/dL (ref 12.0–15.0)
MCH: 30.3 pg (ref 26.0–34.0)
MCHC: 32.9 g/dL (ref 30.0–36.0)
MCV: 92.2 fL (ref 80.0–100.0)
Platelets: 252 10*3/uL (ref 150–400)
RBC: 4.12 MIL/uL (ref 3.87–5.11)
RDW: 14.2 % (ref 11.5–15.5)
WBC: 11.4 10*3/uL — ABNORMAL HIGH (ref 4.0–10.5)
nRBC: 0 % (ref 0.0–0.2)

## 2022-12-27 LAB — BASIC METABOLIC PANEL
Anion gap: 12 (ref 5–15)
BUN: 12 mg/dL (ref 8–23)
CO2: 20 mmol/L — ABNORMAL LOW (ref 22–32)
Calcium: 8.9 mg/dL (ref 8.9–10.3)
Chloride: 108 mmol/L (ref 98–111)
Creatinine, Ser: 1.14 mg/dL — ABNORMAL HIGH (ref 0.44–1.00)
GFR, Estimated: 54 mL/min — ABNORMAL LOW (ref >60)
Glucose, Bld: 83 mg/dL (ref 70–99)
Potassium: 2.9 mmol/L — ABNORMAL LOW (ref 3.5–5.1)
Sodium: 140 mmol/L (ref 135–145)

## 2022-12-27 MED ORDER — HYDROCORTISONE 10 MG PO TABS: ORAL_TABLET | ORAL | 0 refills | Status: AC

## 2022-12-27 MED ORDER — POTASSIUM CHLORIDE CRYS ER 20 MEQ PO TBCR
40.0000 meq | EXTENDED_RELEASE_TABLET | Freq: Two times a day (BID) | ORAL | Status: DC
  Administered 2022-12-27: 40 meq via ORAL
  Filled 2022-12-27: qty 2

## 2022-12-27 MED ORDER — LEVOTHYROXINE SODIUM 150 MCG PO TABS: 150.0000 ug | ORAL_TABLET | Freq: Every day | ORAL | 0 refills | Status: DC

## 2022-12-27 MED ORDER — PANTOPRAZOLE SODIUM 40 MG PO TBEC: 40.0000 mg | DELAYED_RELEASE_TABLET | Freq: Two times a day (BID) | ORAL | 0 refills | Status: DC

## 2022-12-27 MED ORDER — POTASSIUM CHLORIDE CRYS ER 20 MEQ PO TBCR: 40.0000 meq | EXTENDED_RELEASE_TABLET | Freq: Two times a day (BID) | ORAL | 0 refills | Status: DC

## 2022-12-27 NOTE — Progress Notes (Addendum)
Occupational Therapy Treatment Patient Details Name: Rachel Everett MRN: [id number] DOB: 11-11-1960 Today's Date: 12/27/2022   History of present illness 62 yo female admitted 10/5 with N/V, generalized weakness. Pt noted to be hypothermic, hypotensive and bradycardic with myxedema. PMHx: bipolar disorder on lithium, hypothyroidism, HLD   OT comments  Pt. Seen for skilled OT treatment session. Pt. Able to complete bed mobility with min a for blanket management. But no physical assistance for mobility portion.  Min a for simulated toileting task with in room mobility and transfer.  Max encouragement for participation and required verbal and tactile cues throughout.  Cont. With current acute OT POC.  Agree with d/c recommendations.        If plan is discharge home, recommend the following:  A little help with walking and/or transfers;Assistance with cooking/housework;Direct supervision/assist for medications management;Supervision due to cognitive status;A little help with bathing/dressing/bathroom   Equipment Recommendations  BSC/3in1    Recommendations for Other Services      Precautions / Restrictions Precautions Precautions: Fall       Mobility Bed Mobility Overal bed mobility: Needs Assistance Bed Mobility: Supine to Sit     Supine to sit: Supervision, hob flat no use of rails for simulating home environment      General bed mobility comments: max cues for bed mobility. pt. unable to initiate removing blankets. instructions and tactile guidance given multiple times and she cont. to try and sit up and bring legs to edge and oob without pulling blankets to the side.  she cont. to say "how do i get them off how do i get them out of the way" provided multiple attempts and options. she was unable to follow and ultimately required assistance to pull the blankets off and around her feet for safe exit to eob    Transfers Overall transfer level: Needs assistance Equipment used:  Rolling walker (2 wheels) Transfers: Sit to/from Stand, Bed to chair/wheelchair/BSC Sit to Stand: Min assist     Step pivot transfers: Min assist     General transfer comment: minA for vc for safe hand placement and assist for stability in standing     Balance                                           ADL either performed or assessed with clinical judgement   ADL Overall ADL's : Needs assistance/impaired Eating/Feeding: Set up;Sitting Eating/Feeding Details (indicate cue type and reason): assistance for opening containers and organizing plate for her. max encouragement to pick up utensils and begin eating                 Lower Body Dressing: Set up;Sitting/lateral leans;Cueing for sequencing Lower Body Dressing Details (indicate cue type and reason): don socks with figure four seated eob, cues for initiation and completion of task Toilet Transfer: Minimal assistance;Rolling walker (2 wheels);Ambulation Toilet Transfer Details (indicate cue type and reason): simulated with transfer from eob to recliner (pt. declined attempts to use b.room)           General ADL Comments: slow processing of one and multi step commands.  cues and encouragement for engaging in and completing tasks    Extremity/Trunk Assessment              Vision       Perception     Praxis      Cognition  Arousal: Lethargic Behavior During Therapy: WFL for tasks assessed/performed Overall Cognitive Status: Within Functional Limits for tasks assessed Area of Impairment: Memory, Safety/judgement, Problem solving                     Memory: Decreased short-term memory   Safety/Judgement: Decreased awareness of deficits   Problem Solving: Slow processing, Requires verbal cues, Difficulty sequencing General Comments: pt with decreased recall and carry over, requires frequent cues for intiation, cues for sequencing with every aspect of bed mobility.  cont. to apologize  when she had not done or said anything, stating she "knows theyre all up at the nurses station talking about her".  provided reasurrance that was not happening and were all here to help her        Exercises      Shoulder Instructions       General Comments      Pertinent Vitals/ Pain       Pain Assessment Pain Assessment: No/denies pain  Home Living                                          Prior Functioning/Environment              Frequency  Min 1X/week        Progress Toward Goals  OT Goals(current goals can now be found in the care plan section)  Progress towards OT goals: Progressing toward goals     Plan      Co-evaluation                 AM-PAC OT "6 Clicks" Daily Activity     Outcome Measure   Help from another person eating meals?: None Help from another person taking care of personal grooming?: A Little Help from another person toileting, which includes using toliet, bedpan, or urinal?: A Little Help from another person bathing (including washing, rinsing, drying)?: A Little Help from another person to put on and taking off regular upper body clothing?: A Little Help from another person to put on and taking off regular lower body clothing?: A Little 6 Click Score: 19    End of Session Equipment Utilized During Treatment: Rolling walker (2 wheels)  OT Visit Diagnosis: Unsteadiness on feet (R26.81);Other abnormalities of gait and mobility (R26.89);Repeated falls (R29.6);Other symptoms and signs involving the nervous system (R29.898) Pain - Right/Left: Right Pain - part of body: Hip   Activity Tolerance Patient tolerated treatment well   Patient Left in chair;with call bell/phone within reach;with chair alarm set   Nurse Communication Mobility status;Other (comment) (rn states ok to work with pt. today, cna aware pt. up in chair for breakfast with chair alarm on)        Time: 0812-0827 OT Time Calculation (min): 15  min  Charges: OT General Charges $OT Visit: 1 Visit OT Treatments $Self Care/Home Management : 8-22 mins  Boneta Lucks, COTA/L Acute Rehabilitation [phone]   Alessandra Bevels Lorraine-COTA/L 12/27/2022, 11:25 AM

## 2022-12-27 NOTE — Discharge Instructions (Signed)
Advised to follow-up with primary care physician in 1 week. Advised to follow-up with Endocrinologist Dr. Roosevelt Locks in 1 to 2 weeks. Advised to take Cortef 20 mg daily for 2 days followed by Cortef 10 mg daily for 1 dose. Advised to take levothyroxine 150 mcg daily.

## 2022-12-27 NOTE — Discharge Summary (Signed)
Physician Discharge Summary  Rachel Everett ZOX:[id number] DOB: 22-Oct-1960 DOA: 12/20/2022  PCP: Claiborne Rigg, NP  Admit date: 12/20/2022  Discharge date: 12/27/2022  Admitted From:Home.  Disposition:  Home with Home Health.  Recommendations for Outpatient Follow-up:  Follow up with PCP in 1-2 weeks. Please obtain BMP/CBC in one week Advised to follow-up with Endocrinologist Dr. Roosevelt Locks in 1 to 2 weeks. Advised to take Cortef 20 mg daily for 2 days followed by Cortef 10 mg daily for 1 dose. Advised to take levothyroxine 150 mcg daily. Advised to take potassium chloride 40 mg twice daily for 3 days. Advised to follow-up with PCP to readjust lithium and propranolol dosing.  Home Health:None Equipment/Devices:None  Discharge Condition: Stable CODE STATUS:Full code Diet recommendation: Heart Healthy  Brief [facility] Course: This 62 year old female with multiple medical comorbidities presented in the ED with nausea, vomiting, and weakness. Patient has been off Synthroid for more than a month.  She has also missed at least 4 days of her Lamictal and lithium. Daughter noted decreased activity, always feeling cold and wanting blankets, poor appetite, and more recently difficulty walking and having trouble with her balance. On arrival she was found to be bradycardic with heart rate in the 40s to 50s, hypotensive with systolic blood pressure in the 70s, hypothermic with temperature of 92.3. She was lethargic but oriented x 3. Significant lab abnormalities in ED,  nonanion gap metabolic acidosis, and mild AKI.  initial TSH was 67.01. She was administered IV fluids, cultures were sent, cortisol sent, free T and free T4 sent, stress dose steroids initiated, and because of her ongoing hypotension,  norepinephrine infusion was started and critical care asked to admit.  Patient was managed in the ICU initially and then subsequently transferred to medical floor.  She has made significant  improvement.  IV medications switched to oral. She reports abdominal pain,  nausea and vomiting has improved.  She is tolerating levothyroxine.  Steroid tapering initiated.  Patient feels better,  wants to be discharged.  Home health services arranged.  Patient being discharged home.  Discharge Diagnoses:  Principal Problem:   Myxedema coma (HCC)  Abdominal Pain  Nausea: > Resolved. CT abdomen and pelvis > No acute abnormalities found. Synthroid IV changed to oral, start tapering steroids. Continue IV Zofran as needed and pain control. She reports abdominal pain has improved.   Shock due to symptomatic bradycardia related to myxedema and electrolyte abnormalities: She remains off pressors now. Continue Oral rehydration Continue to hold beta-blocker Discontinue Antibiotics.   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 appreciate cardiology assistance - see 10/5 note   Myxedema coma w/ hypothermia: Has not taken her medications for several weeks.  Has a history of noncompliance.   She was Bradycardic, hypothermic , hypotensive, lethargic with TSH almost 70. She received loading dose of levothyroxine, continue 100 mcg daily Continue levothyroxine 125 mcg daily. Start tapering steroids. Advised to follow-up with endocrinology, appointment made.   Acute metabolic encephalopathy:> Resolved. Hold PTA lithium today.  Restarted Lamictal. PT and OT evaluation. Reorientation. Continue Synthroid Continue delirium precautions   AKI > Resolved. Avoid nephrotoxic medications,  continue to trend.   Hypokalemia: Replaced. Continue to monitor.   Hypophosphatemia, resolved Continue to monitor.   N&V: Continue Antiemetics as needed.   GERD Continue PPI.   UTI Urine culture shows no growth.  Antibiotic discontinued  Discharge  Instructions  only DME Walker rolling  Once       Question Answer Comment  Walker: With 5 Inch Wheels   Patient needs a walker to treat with the following condition Myxedema coma (HCC)      12/24/22 1057            Follow-up Information     Claiborne Rigg, NP. Schedule an appointment as soon as possible for a visit.   Specialty: Nurse Practitioner Why: Call the office and schedule a hospital follow up in the next 7-10 days. Contact information: [address] [place] [address] [place] [address] [phone]         [facility] [place]. Call.   Specialty: Rehabilitation Why: Call the Outpatient Rehabilitation Center and follow up regarding needed Outpatient therapy. Contact information: [address] [place] [address] [phone]        Altamese Big Sandy, MD Follow up in 2 week(s).    Specialty: Endocrinology Contact information: [address] [place] [address] [place] [address] [phone]                No Known Allergies  Consultations: PCCM   Procedures/Studies: DG Abd 1 View  Result Date: 12/25/2022 CLINICAL DATA:  Abdominal distension. EXAM: ABDOMEN - 1 VIEW COMPARISON:  December 23, 2022. FINDINGS: No abnormal bowel dilatation is noted. Mild amount of stool seen throughout the colon. Status post cholecystectomy. No radio-opaque calculi or other significant radiographic abnormality are seen. IMPRESSION: No abnormal bowel dilatation. Electronically Signed   By: Lupita Raider M.D.   On: 12/25/2022 16:27   CT ABDOMEN PELVIS W CONTRAST  Result Date: 12/23/2022 CLINICAL DATA:  Acute nonlocalized abdominal pain EXAM: CT ABDOMEN AND PELVIS WITH CONTRAST TECHNIQUE: Multidetector CT imaging of the abdomen and pelvis was performed using the standard protocol following bolus administration of intravenous contrast. RADIATION DOSE REDUCTION: This exam was performed according to the departmental dose-optimization program which includes automated exposure control, adjustment of the mA and/or kV according to patient size and/or use of iterative reconstruction technique. CONTRAST:  75mL OMNIPAQUE IOHEXOL 350 MG/ML SOLN COMPARISON:  12/20/2022 FINDINGS: Lower chest: Small bilateral pleural effusions. No acute abnormality. Small hiatal hernia. Hepatobiliary: Moderate hepatic steatosis with superimposed focal fatty hepatic infiltration along the falciform ligament. No enhancing intrahepatic mass. No intra or extrahepatic biliary ductal dilation. Gallbladder unremarkable. Pancreas: Unremarkable Spleen: Unremarkable Adrenals/Urinary Tract: Unremarkable Stomach/Bowel: Stomach is within normal limits. Appendix appears normal. No evidence of bowel wall thickening, distention, or inflammatory changes. Vascular/Lymphatic: Aortic atherosclerosis. No enlarged abdominal or pelvic lymph  nodes. Reproductive: Uterus and bilateral adnexa are unremarkable. Other: Mild diffuse subcutaneous body wall edema. Musculoskeletal: Osseous structures are age-appropriate. No acute bone abnormality. No lytic or blastic bone lesion. IMPRESSION: 1. No acute intra-abdominal pathology identified. No definite radiographic explanation for the patient's reported symptoms. 2. Small bilateral pleural effusions. 3. Moderate hepatic steatosis. 4. Small hiatal hernia. 5. Mild diffuse subcutaneous body wall edema. Aortic Atherosclerosis (ICD10-I70.0). Electronically Signed   By: Helyn Numbers M.D.   On: 12/23/2022 22:33   ECHOCARDIOGRAM COMPLETE  Result Date: 12/21/2022    ECHOCARDIOGRAM REPORT   Patient Name:   AHMIA Witz Date of Exam: 12/21/2022 Medical Rec #:  [id number]   Height:       66.0 in Accession #:    [id number]  Weight:       160.9 lb Date of Birth:  Oct 16, 1960  BSA:  only DME Walker rolling  Once       Question Answer Comment  Walker: With 5 Inch Wheels   Patient needs a walker to treat with the following condition Myxedema coma (HCC)      12/24/22 1057            Follow-up Information     Claiborne Rigg, NP. Schedule an appointment as soon as possible for a visit.   Specialty: Nurse Practitioner Why: Call the office and schedule a hospital follow up in the next 7-10 days. Contact information: [address] [place] [address] [place] [address] [phone]         [facility] [place]. Call.   Specialty: Rehabilitation Why: Call the Outpatient Rehabilitation Center and follow up regarding needed Outpatient therapy. Contact information: [address] [place] [address] [phone]        Altamese Big Sandy, MD Follow up in 2 week(s).    Specialty: Endocrinology Contact information: [address] [place] [address] [place] [address] [phone]                No Known Allergies  Consultations: PCCM   Procedures/Studies: DG Abd 1 View  Result Date: 12/25/2022 CLINICAL DATA:  Abdominal distension. EXAM: ABDOMEN - 1 VIEW COMPARISON:  December 23, 2022. FINDINGS: No abnormal bowel dilatation is noted. Mild amount of stool seen throughout the colon. Status post cholecystectomy. No radio-opaque calculi or other significant radiographic abnormality are seen. IMPRESSION: No abnormal bowel dilatation. Electronically Signed   By: Lupita Raider M.D.   On: 12/25/2022 16:27   CT ABDOMEN PELVIS W CONTRAST  Result Date: 12/23/2022 CLINICAL DATA:  Acute nonlocalized abdominal pain EXAM: CT ABDOMEN AND PELVIS WITH CONTRAST TECHNIQUE: Multidetector CT imaging of the abdomen and pelvis was performed using the standard protocol following bolus administration of intravenous contrast. RADIATION DOSE REDUCTION: This exam was performed according to the departmental dose-optimization program which includes automated exposure control, adjustment of the mA and/or kV according to patient size and/or use of iterative reconstruction technique. CONTRAST:  75mL OMNIPAQUE IOHEXOL 350 MG/ML SOLN COMPARISON:  12/20/2022 FINDINGS: Lower chest: Small bilateral pleural effusions. No acute abnormality. Small hiatal hernia. Hepatobiliary: Moderate hepatic steatosis with superimposed focal fatty hepatic infiltration along the falciform ligament. No enhancing intrahepatic mass. No intra or extrahepatic biliary ductal dilation. Gallbladder unremarkable. Pancreas: Unremarkable Spleen: Unremarkable Adrenals/Urinary Tract: Unremarkable Stomach/Bowel: Stomach is within normal limits. Appendix appears normal. No evidence of bowel wall thickening, distention, or inflammatory changes. Vascular/Lymphatic: Aortic atherosclerosis. No enlarged abdominal or pelvic lymph  nodes. Reproductive: Uterus and bilateral adnexa are unremarkable. Other: Mild diffuse subcutaneous body wall edema. Musculoskeletal: Osseous structures are age-appropriate. No acute bone abnormality. No lytic or blastic bone lesion. IMPRESSION: 1. No acute intra-abdominal pathology identified. No definite radiographic explanation for the patient's reported symptoms. 2. Small bilateral pleural effusions. 3. Moderate hepatic steatosis. 4. Small hiatal hernia. 5. Mild diffuse subcutaneous body wall edema. Aortic Atherosclerosis (ICD10-I70.0). Electronically Signed   By: Helyn Numbers M.D.   On: 12/23/2022 22:33   ECHOCARDIOGRAM COMPLETE  Result Date: 12/21/2022    ECHOCARDIOGRAM REPORT   Patient Name:   AHMIA Witz Date of Exam: 12/21/2022 Medical Rec #:  [id number]   Height:       66.0 in Accession #:    [id number]  Weight:       160.9 lb Date of Birth:  Oct 16, 1960  BSA:  only DME Walker rolling  Once       Question Answer Comment  Walker: With 5 Inch Wheels   Patient needs a walker to treat with the following condition Myxedema coma (HCC)      12/24/22 1057            Follow-up Information     Claiborne Rigg, NP. Schedule an appointment as soon as possible for a visit.   Specialty: Nurse Practitioner Why: Call the office and schedule a hospital follow up in the next 7-10 days. Contact information: [address] [place] [address] [place] [address] [phone]         [facility] [place]. Call.   Specialty: Rehabilitation Why: Call the Outpatient Rehabilitation Center and follow up regarding needed Outpatient therapy. Contact information: [address] [place] [address] [phone]        Altamese Big Sandy, MD Follow up in 2 week(s).    Specialty: Endocrinology Contact information: [address] [place] [address] [place] [address] [phone]                No Known Allergies  Consultations: PCCM   Procedures/Studies: DG Abd 1 View  Result Date: 12/25/2022 CLINICAL DATA:  Abdominal distension. EXAM: ABDOMEN - 1 VIEW COMPARISON:  December 23, 2022. FINDINGS: No abnormal bowel dilatation is noted. Mild amount of stool seen throughout the colon. Status post cholecystectomy. No radio-opaque calculi or other significant radiographic abnormality are seen. IMPRESSION: No abnormal bowel dilatation. Electronically Signed   By: Lupita Raider M.D.   On: 12/25/2022 16:27   CT ABDOMEN PELVIS W CONTRAST  Result Date: 12/23/2022 CLINICAL DATA:  Acute nonlocalized abdominal pain EXAM: CT ABDOMEN AND PELVIS WITH CONTRAST TECHNIQUE: Multidetector CT imaging of the abdomen and pelvis was performed using the standard protocol following bolus administration of intravenous contrast. RADIATION DOSE REDUCTION: This exam was performed according to the departmental dose-optimization program which includes automated exposure control, adjustment of the mA and/or kV according to patient size and/or use of iterative reconstruction technique. CONTRAST:  75mL OMNIPAQUE IOHEXOL 350 MG/ML SOLN COMPARISON:  12/20/2022 FINDINGS: Lower chest: Small bilateral pleural effusions. No acute abnormality. Small hiatal hernia. Hepatobiliary: Moderate hepatic steatosis with superimposed focal fatty hepatic infiltration along the falciform ligament. No enhancing intrahepatic mass. No intra or extrahepatic biliary ductal dilation. Gallbladder unremarkable. Pancreas: Unremarkable Spleen: Unremarkable Adrenals/Urinary Tract: Unremarkable Stomach/Bowel: Stomach is within normal limits. Appendix appears normal. No evidence of bowel wall thickening, distention, or inflammatory changes. Vascular/Lymphatic: Aortic atherosclerosis. No enlarged abdominal or pelvic lymph  nodes. Reproductive: Uterus and bilateral adnexa are unremarkable. Other: Mild diffuse subcutaneous body wall edema. Musculoskeletal: Osseous structures are age-appropriate. No acute bone abnormality. No lytic or blastic bone lesion. IMPRESSION: 1. No acute intra-abdominal pathology identified. No definite radiographic explanation for the patient's reported symptoms. 2. Small bilateral pleural effusions. 3. Moderate hepatic steatosis. 4. Small hiatal hernia. 5. Mild diffuse subcutaneous body wall edema. Aortic Atherosclerosis (ICD10-I70.0). Electronically Signed   By: Helyn Numbers M.D.   On: 12/23/2022 22:33   ECHOCARDIOGRAM COMPLETE  Result Date: 12/21/2022    ECHOCARDIOGRAM REPORT   Patient Name:   AHMIA Witz Date of Exam: 12/21/2022 Medical Rec #:  [id number]   Height:       66.0 in Accession #:    [id number]  Weight:       160.9 lb Date of Birth:  Oct 16, 1960  BSA:  Physician Discharge Summary  Rachel Everett ZOX:[id number] DOB: 22-Oct-1960 DOA: 12/20/2022  PCP: Claiborne Rigg, NP  Admit date: 12/20/2022  Discharge date: 12/27/2022  Admitted From:Home.  Disposition:  Home with Home Health.  Recommendations for Outpatient Follow-up:  Follow up with PCP in 1-2 weeks. Please obtain BMP/CBC in one week Advised to follow-up with Endocrinologist Dr. Roosevelt Locks in 1 to 2 weeks. Advised to take Cortef 20 mg daily for 2 days followed by Cortef 10 mg daily for 1 dose. Advised to take levothyroxine 150 mcg daily. Advised to take potassium chloride 40 mg twice daily for 3 days. Advised to follow-up with PCP to readjust lithium and propranolol dosing.  Home Health:None Equipment/Devices:None  Discharge Condition: Stable CODE STATUS:Full code Diet recommendation: Heart Healthy  Brief [facility] Course: This 62 year old female with multiple medical comorbidities presented in the ED with nausea, vomiting, and weakness. Patient has been off Synthroid for more than a month.  She has also missed at least 4 days of her Lamictal and lithium. Daughter noted decreased activity, always feeling cold and wanting blankets, poor appetite, and more recently difficulty walking and having trouble with her balance. On arrival she was found to be bradycardic with heart rate in the 40s to 50s, hypotensive with systolic blood pressure in the 70s, hypothermic with temperature of 92.3. She was lethargic but oriented x 3. Significant lab abnormalities in ED,  nonanion gap metabolic acidosis, and mild AKI.  initial TSH was 67.01. She was administered IV fluids, cultures were sent, cortisol sent, free T and free T4 sent, stress dose steroids initiated, and because of her ongoing hypotension,  norepinephrine infusion was started and critical care asked to admit.  Patient was managed in the ICU initially and then subsequently transferred to medical floor.  She has made significant  improvement.  IV medications switched to oral. She reports abdominal pain,  nausea and vomiting has improved.  She is tolerating levothyroxine.  Steroid tapering initiated.  Patient feels better,  wants to be discharged.  Home health services arranged.  Patient being discharged home.  Discharge Diagnoses:  Principal Problem:   Myxedema coma (HCC)  Abdominal Pain  Nausea: > Resolved. CT abdomen and pelvis > No acute abnormalities found. Synthroid IV changed to oral, start tapering steroids. Continue IV Zofran as needed and pain control. She reports abdominal pain has improved.   Shock due to symptomatic bradycardia related to myxedema and electrolyte abnormalities: She remains off pressors now. Continue Oral rehydration Continue to hold beta-blocker Discontinue Antibiotics.   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 appreciate cardiology assistance - see 10/5 note   Myxedema coma w/ hypothermia: Has not taken her medications for several weeks.  Has a history of noncompliance.   She was Bradycardic, hypothermic , hypotensive, lethargic with TSH almost 70. She received loading dose of levothyroxine, continue 100 mcg daily Continue levothyroxine 125 mcg daily. Start tapering steroids. Advised to follow-up with endocrinology, appointment made.   Acute metabolic encephalopathy:> Resolved. Hold PTA lithium today.  Restarted Lamictal. PT and OT evaluation. Reorientation. Continue Synthroid Continue delirium precautions   AKI > Resolved. Avoid nephrotoxic medications,  continue to trend.   Hypokalemia: Replaced. Continue to monitor.   Hypophosphatemia, resolved Continue to monitor.   N&V: Continue Antiemetics as needed.   GERD Continue PPI.   UTI Urine culture shows no growth.  Antibiotic discontinued  Discharge  Instructions  only DME Walker rolling  Once       Question Answer Comment  Walker: With 5 Inch Wheels   Patient needs a walker to treat with the following condition Myxedema coma (HCC)      12/24/22 1057            Follow-up Information     Claiborne Rigg, NP. Schedule an appointment as soon as possible for a visit.   Specialty: Nurse Practitioner Why: Call the office and schedule a hospital follow up in the next 7-10 days. Contact information: [address] [place] [address] [place] [address] [phone]         [facility] [place]. Call.   Specialty: Rehabilitation Why: Call the Outpatient Rehabilitation Center and follow up regarding needed Outpatient therapy. Contact information: [address] [place] [address] [phone]        Altamese Big Sandy, MD Follow up in 2 week(s).    Specialty: Endocrinology Contact information: [address] [place] [address] [place] [address] [phone]                No Known Allergies  Consultations: PCCM   Procedures/Studies: DG Abd 1 View  Result Date: 12/25/2022 CLINICAL DATA:  Abdominal distension. EXAM: ABDOMEN - 1 VIEW COMPARISON:  December 23, 2022. FINDINGS: No abnormal bowel dilatation is noted. Mild amount of stool seen throughout the colon. Status post cholecystectomy. No radio-opaque calculi or other significant radiographic abnormality are seen. IMPRESSION: No abnormal bowel dilatation. Electronically Signed   By: Lupita Raider M.D.   On: 12/25/2022 16:27   CT ABDOMEN PELVIS W CONTRAST  Result Date: 12/23/2022 CLINICAL DATA:  Acute nonlocalized abdominal pain EXAM: CT ABDOMEN AND PELVIS WITH CONTRAST TECHNIQUE: Multidetector CT imaging of the abdomen and pelvis was performed using the standard protocol following bolus administration of intravenous contrast. RADIATION DOSE REDUCTION: This exam was performed according to the departmental dose-optimization program which includes automated exposure control, adjustment of the mA and/or kV according to patient size and/or use of iterative reconstruction technique. CONTRAST:  75mL OMNIPAQUE IOHEXOL 350 MG/ML SOLN COMPARISON:  12/20/2022 FINDINGS: Lower chest: Small bilateral pleural effusions. No acute abnormality. Small hiatal hernia. Hepatobiliary: Moderate hepatic steatosis with superimposed focal fatty hepatic infiltration along the falciform ligament. No enhancing intrahepatic mass. No intra or extrahepatic biliary ductal dilation. Gallbladder unremarkable. Pancreas: Unremarkable Spleen: Unremarkable Adrenals/Urinary Tract: Unremarkable Stomach/Bowel: Stomach is within normal limits. Appendix appears normal. No evidence of bowel wall thickening, distention, or inflammatory changes. Vascular/Lymphatic: Aortic atherosclerosis. No enlarged abdominal or pelvic lymph  nodes. Reproductive: Uterus and bilateral adnexa are unremarkable. Other: Mild diffuse subcutaneous body wall edema. Musculoskeletal: Osseous structures are age-appropriate. No acute bone abnormality. No lytic or blastic bone lesion. IMPRESSION: 1. No acute intra-abdominal pathology identified. No definite radiographic explanation for the patient's reported symptoms. 2. Small bilateral pleural effusions. 3. Moderate hepatic steatosis. 4. Small hiatal hernia. 5. Mild diffuse subcutaneous body wall edema. Aortic Atherosclerosis (ICD10-I70.0). Electronically Signed   By: Helyn Numbers M.D.   On: 12/23/2022 22:33   ECHOCARDIOGRAM COMPLETE  Result Date: 12/21/2022    ECHOCARDIOGRAM REPORT   Patient Name:   AHMIA Witz Date of Exam: 12/21/2022 Medical Rec #:  [id number]   Height:       66.0 in Accession #:    [id number]  Weight:       160.9 lb Date of Birth:  Oct 16, 1960  BSA:  only DME Walker rolling  Once       Question Answer Comment  Walker: With 5 Inch Wheels   Patient needs a walker to treat with the following condition Myxedema coma (HCC)      12/24/22 1057            Follow-up Information     Claiborne Rigg, NP. Schedule an appointment as soon as possible for a visit.   Specialty: Nurse Practitioner Why: Call the office and schedule a hospital follow up in the next 7-10 days. Contact information: [address] [place] [address] [place] [address] [phone]         [facility] [place]. Call.   Specialty: Rehabilitation Why: Call the Outpatient Rehabilitation Center and follow up regarding needed Outpatient therapy. Contact information: [address] [place] [address] [phone]        Altamese Big Sandy, MD Follow up in 2 week(s).    Specialty: Endocrinology Contact information: [address] [place] [address] [place] [address] [phone]                No Known Allergies  Consultations: PCCM   Procedures/Studies: DG Abd 1 View  Result Date: 12/25/2022 CLINICAL DATA:  Abdominal distension. EXAM: ABDOMEN - 1 VIEW COMPARISON:  December 23, 2022. FINDINGS: No abnormal bowel dilatation is noted. Mild amount of stool seen throughout the colon. Status post cholecystectomy. No radio-opaque calculi or other significant radiographic abnormality are seen. IMPRESSION: No abnormal bowel dilatation. Electronically Signed   By: Lupita Raider M.D.   On: 12/25/2022 16:27   CT ABDOMEN PELVIS W CONTRAST  Result Date: 12/23/2022 CLINICAL DATA:  Acute nonlocalized abdominal pain EXAM: CT ABDOMEN AND PELVIS WITH CONTRAST TECHNIQUE: Multidetector CT imaging of the abdomen and pelvis was performed using the standard protocol following bolus administration of intravenous contrast. RADIATION DOSE REDUCTION: This exam was performed according to the departmental dose-optimization program which includes automated exposure control, adjustment of the mA and/or kV according to patient size and/or use of iterative reconstruction technique. CONTRAST:  75mL OMNIPAQUE IOHEXOL 350 MG/ML SOLN COMPARISON:  12/20/2022 FINDINGS: Lower chest: Small bilateral pleural effusions. No acute abnormality. Small hiatal hernia. Hepatobiliary: Moderate hepatic steatosis with superimposed focal fatty hepatic infiltration along the falciform ligament. No enhancing intrahepatic mass. No intra or extrahepatic biliary ductal dilation. Gallbladder unremarkable. Pancreas: Unremarkable Spleen: Unremarkable Adrenals/Urinary Tract: Unremarkable Stomach/Bowel: Stomach is within normal limits. Appendix appears normal. No evidence of bowel wall thickening, distention, or inflammatory changes. Vascular/Lymphatic: Aortic atherosclerosis. No enlarged abdominal or pelvic lymph  nodes. Reproductive: Uterus and bilateral adnexa are unremarkable. Other: Mild diffuse subcutaneous body wall edema. Musculoskeletal: Osseous structures are age-appropriate. No acute bone abnormality. No lytic or blastic bone lesion. IMPRESSION: 1. No acute intra-abdominal pathology identified. No definite radiographic explanation for the patient's reported symptoms. 2. Small bilateral pleural effusions. 3. Moderate hepatic steatosis. 4. Small hiatal hernia. 5. Mild diffuse subcutaneous body wall edema. Aortic Atherosclerosis (ICD10-I70.0). Electronically Signed   By: Helyn Numbers M.D.   On: 12/23/2022 22:33   ECHOCARDIOGRAM COMPLETE  Result Date: 12/21/2022    ECHOCARDIOGRAM REPORT   Patient Name:   AHMIA Witz Date of Exam: 12/21/2022 Medical Rec #:  [id number]   Height:       66.0 in Accession #:    [id number]  Weight:       160.9 lb Date of Birth:  Oct 16, 1960  BSA:  Physician Discharge Summary  Rachel Everett ZOX:[id number] DOB: 22-Oct-1960 DOA: 12/20/2022  PCP: Claiborne Rigg, NP  Admit date: 12/20/2022  Discharge date: 12/27/2022  Admitted From:Home.  Disposition:  Home with Home Health.  Recommendations for Outpatient Follow-up:  Follow up with PCP in 1-2 weeks. Please obtain BMP/CBC in one week Advised to follow-up with Endocrinologist Dr. Roosevelt Locks in 1 to 2 weeks. Advised to take Cortef 20 mg daily for 2 days followed by Cortef 10 mg daily for 1 dose. Advised to take levothyroxine 150 mcg daily. Advised to take potassium chloride 40 mg twice daily for 3 days. Advised to follow-up with PCP to readjust lithium and propranolol dosing.  Home Health:None Equipment/Devices:None  Discharge Condition: Stable CODE STATUS:Full code Diet recommendation: Heart Healthy  Brief [facility] Course: This 62 year old female with multiple medical comorbidities presented in the ED with nausea, vomiting, and weakness. Patient has been off Synthroid for more than a month.  She has also missed at least 4 days of her Lamictal and lithium. Daughter noted decreased activity, always feeling cold and wanting blankets, poor appetite, and more recently difficulty walking and having trouble with her balance. On arrival she was found to be bradycardic with heart rate in the 40s to 50s, hypotensive with systolic blood pressure in the 70s, hypothermic with temperature of 92.3. She was lethargic but oriented x 3. Significant lab abnormalities in ED,  nonanion gap metabolic acidosis, and mild AKI.  initial TSH was 67.01. She was administered IV fluids, cultures were sent, cortisol sent, free T and free T4 sent, stress dose steroids initiated, and because of her ongoing hypotension,  norepinephrine infusion was started and critical care asked to admit.  Patient was managed in the ICU initially and then subsequently transferred to medical floor.  She has made significant  improvement.  IV medications switched to oral. She reports abdominal pain,  nausea and vomiting has improved.  She is tolerating levothyroxine.  Steroid tapering initiated.  Patient feels better,  wants to be discharged.  Home health services arranged.  Patient being discharged home.  Discharge Diagnoses:  Principal Problem:   Myxedema coma (HCC)  Abdominal Pain  Nausea: > Resolved. CT abdomen and pelvis > No acute abnormalities found. Synthroid IV changed to oral, start tapering steroids. Continue IV Zofran as needed and pain control. She reports abdominal pain has improved.   Shock due to symptomatic bradycardia related to myxedema and electrolyte abnormalities: She remains off pressors now. Continue Oral rehydration Continue to hold beta-blocker Discontinue Antibiotics.   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 appreciate cardiology assistance - see 10/5 note   Myxedema coma w/ hypothermia: Has not taken her medications for several weeks.  Has a history of noncompliance.   She was Bradycardic, hypothermic , hypotensive, lethargic with TSH almost 70. She received loading dose of levothyroxine, continue 100 mcg daily Continue levothyroxine 125 mcg daily. Start tapering steroids. Advised to follow-up with endocrinology, appointment made.   Acute metabolic encephalopathy:> Resolved. Hold PTA lithium today.  Restarted Lamictal. PT and OT evaluation. Reorientation. Continue Synthroid Continue delirium precautions   AKI > Resolved. Avoid nephrotoxic medications,  continue to trend.   Hypokalemia: Replaced. Continue to monitor.   Hypophosphatemia, resolved Continue to monitor.   N&V: Continue Antiemetics as needed.   GERD Continue PPI.   UTI Urine culture shows no growth.  Antibiotic discontinued  Discharge  Instructions  Physician Discharge Summary  Rachel Everett ZOX:[id number] DOB: 22-Oct-1960 DOA: 12/20/2022  PCP: Claiborne Rigg, NP  Admit date: 12/20/2022  Discharge date: 12/27/2022  Admitted From:Home.  Disposition:  Home with Home Health.  Recommendations for Outpatient Follow-up:  Follow up with PCP in 1-2 weeks. Please obtain BMP/CBC in one week Advised to follow-up with Endocrinologist Dr. Roosevelt Locks in 1 to 2 weeks. Advised to take Cortef 20 mg daily for 2 days followed by Cortef 10 mg daily for 1 dose. Advised to take levothyroxine 150 mcg daily. Advised to take potassium chloride 40 mg twice daily for 3 days. Advised to follow-up with PCP to readjust lithium and propranolol dosing.  Home Health:None Equipment/Devices:None  Discharge Condition: Stable CODE STATUS:Full code Diet recommendation: Heart Healthy  Brief [facility] Course: This 62 year old female with multiple medical comorbidities presented in the ED with nausea, vomiting, and weakness. Patient has been off Synthroid for more than a month.  She has also missed at least 4 days of her Lamictal and lithium. Daughter noted decreased activity, always feeling cold and wanting blankets, poor appetite, and more recently difficulty walking and having trouble with her balance. On arrival she was found to be bradycardic with heart rate in the 40s to 50s, hypotensive with systolic blood pressure in the 70s, hypothermic with temperature of 92.3. She was lethargic but oriented x 3. Significant lab abnormalities in ED,  nonanion gap metabolic acidosis, and mild AKI.  initial TSH was 67.01. She was administered IV fluids, cultures were sent, cortisol sent, free T and free T4 sent, stress dose steroids initiated, and because of her ongoing hypotension,  norepinephrine infusion was started and critical care asked to admit.  Patient was managed in the ICU initially and then subsequently transferred to medical floor.  She has made significant  improvement.  IV medications switched to oral. She reports abdominal pain,  nausea and vomiting has improved.  She is tolerating levothyroxine.  Steroid tapering initiated.  Patient feels better,  wants to be discharged.  Home health services arranged.  Patient being discharged home.  Discharge Diagnoses:  Principal Problem:   Myxedema coma (HCC)  Abdominal Pain  Nausea: > Resolved. CT abdomen and pelvis > No acute abnormalities found. Synthroid IV changed to oral, start tapering steroids. Continue IV Zofran as needed and pain control. She reports abdominal pain has improved.   Shock due to symptomatic bradycardia related to myxedema and electrolyte abnormalities: She remains off pressors now. Continue Oral rehydration Continue to hold beta-blocker Discontinue Antibiotics.   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 appreciate cardiology assistance - see 10/5 note   Myxedema coma w/ hypothermia: Has not taken her medications for several weeks.  Has a history of noncompliance.   She was Bradycardic, hypothermic , hypotensive, lethargic with TSH almost 70. She received loading dose of levothyroxine, continue 100 mcg daily Continue levothyroxine 125 mcg daily. Start tapering steroids. Advised to follow-up with endocrinology, appointment made.   Acute metabolic encephalopathy:> Resolved. Hold PTA lithium today.  Restarted Lamictal. PT and OT evaluation. Reorientation. Continue Synthroid Continue delirium precautions   AKI > Resolved. Avoid nephrotoxic medications,  continue to trend.   Hypokalemia: Replaced. Continue to monitor.   Hypophosphatemia, resolved Continue to monitor.   N&V: Continue Antiemetics as needed.   GERD Continue PPI.   UTI Urine culture shows no growth.  Antibiotic discontinued  Discharge  Instructions

## 2022-12-27 NOTE — Plan of Care (Signed)

## 2022-12-29 ENCOUNTER — Telehealth: Payer: Self-pay

## 2022-12-29 NOTE — Transitions of Care (Post Inpatient/ED Visit) (Signed)
12/29/2022  Name: Rachel Everett MRN: [id number] DOB: 1961/01/25  Today's TOC FU Call Status: Today's TOC FU Call Status:: Unsuccessful Call (1st Attempt) Unsuccessful Call (1st Attempt) Date: 12/29/22  Attempted to reach the patient regarding the most recent Inpatient/ED visit.  Follow Up Plan: Additional outreach attempts will be made to reach the patient to complete the Transitions of Care (Post Inpatient/ED visit) call.      Antionette Fairy, RN,BSN,CCM RN Care Manager Transitions of Care  [place]  Direct Phone: [phone] Toll Free: [phone] Fax: [phone]

## 2022-12-30 ENCOUNTER — Telehealth: Payer: Self-pay

## 2022-12-30 NOTE — Transitions of Care (Post Inpatient/ED Visit) (Signed)
12/30/2022  Name: Rachel Everett MRN: [id number] DOB: 03-31-1960  Today's TOC FU Call Status: Today's TOC FU Call Status:: Successful TOC FU Call Completed TOC FU Call Complete Date: 12/30/22 Patient's Name and Date of Birth confirmed.  Transition Care Management Follow-up Telephone Call Date of Discharge: 12/27/22 Discharge Facility: [facility]) Type of Discharge: Inpatient Admission Primary Inpatient Discharge Diagnosis:: "cardiogenic shock" How have you been since you were released from the hospital?: Same (Dtr states that pt fell this morning trying to get out of bed-"legs gave out on her'-no bruises or injuries noted,pt in bed resting comfortably at present) Any questions or concerns?: Yes Patient Questions/Concerns:: daughter states that pt's has been without thyroidmed for two days as meds were delivered to wrong apartment today she believes based on phone call with pharmacy Patient Questions/Concerns Addressed: Other: (Dtr voices she has called pharmacy and will now call provider office to follow up to see what can be done to get pt her meds)  Items Reviewed: Did you receive and understand the discharge instructions provided?: Yes Medications obtained,verified, and reconciled?: No Medications Not Reviewed Reasons:: Other: (Daughter anxious to end call to call provider) Any new allergies since your discharge?: No Dietary orders reviewed?: NA Do you have support at home?: Yes People in Home: child(ren), adult Name of Support/Comfort Primary Source: Alex-daughter  Medications Reviewed Today: Medications Reviewed Today   Medications were not reviewed in this encounter     Home Care and Equipment/Supplies: Were Home Health Services Ordered?: NA (Pt was setup with outpt therapy-[facility] to follow up regarding appt) Any new equipment or medical supplies ordered?: Yes Name of Medical supply agency?: [employer], [employer] Were you able to get the equipment/medical  supplies?: Yes Do you have any questions related to the use of the equipment/supplies?: No  Functional Questionnaire: Do you need assistance with bathing/showering or dressing?:  (unable to assess call ended early)  Follow up appointments reviewed: PCP Follow-up appointment confirmed?: No (daughter voices tht pt is followed by [facility] for primary care services as well and does not go to [facility] not been seen there in over 2 yrs) MD Provider Line Number:[phone] Given: No Specialist Hospital Follow-up appointment confirmed?: No Reason Specialist Follow-Up Not Confirmed: Patient has Specialist Provider Number and will Call for Appointment (daughter will callto make endo appt) Do you need transportation to your follow-up appointment?: No Do you understand care options if your condition(s) worsen?: Yes-patient verbalized understanding   TOC Interventions Today    Flowsheet Row Most Recent Value  TOC Interventions   TOC Interventions Discussed/Reviewed TOC Interventions Discussed      Interventions Today    Flowsheet Row Most Recent Value  General Interventions   General Interventions Discussed/Reviewed General Interventions Discussed, Doctor Visits  Doctor Visits Discussed/Reviewed Doctor Visits Discussed, PCP, Specialist  PCP/Specialist Visits Compliance with follow-up visit  Education Interventions   Education Provided Provided Education  Provided Verbal Education On When to see the doctor, Medication  Pharmacy Interventions   Pharmacy Dicussed/Reviewed Pharmacy Topics Discussed, Medications and their functions  Safety Interventions   Safety Discussed/Reviewed Safety Discussed, Fall Risk, Home Safety  Home Safety Assistive Devices       Antionette Fairy, [place] RN Care Manager Transitions of Care  [place]  Direct Phone: [phone] Toll Free: [phone] Fax: [phone]

## 2023-01-21 ENCOUNTER — Telehealth: Payer: Self-pay

## 2023-01-21 NOTE — Telephone Encounter (Signed)
Appt reminder left on voicemail/PP

## 2023-01-21 NOTE — Progress Notes (Deleted)
Patient ID: Rachel Everett, female   DOB: 1960/11/19, 62 y.o.   MRN: [id number]   Admit date: 12/20/2022   Discharge date: 12/27/2022   Admitted From:Home.   Disposition:  Home with Home Health.   Recommendations for Outpatient Follow-up:  Follow up with PCP in 1-2 weeks. Please obtain BMP/CBC in one week Advised to follow-up with Endocrinologist Dr. Roosevelt Locks in 1 to 2 weeks. Advised to take Cortef 20 mg daily for 2 days followed by Cortef 10 mg daily for 1 dose. Advised to take levothyroxine 150 mcg daily. Advised to take potassium chloride 40 mg twice daily for 3 days. Advised to follow-up with PCP to readjust lithium and propranolol dosing.   Home Health:None Equipment/Devices:None   Discharge Condition: Stable CODE STATUS:Full code Diet recommendation: Heart Healthy   Brief [facility] Course: This 62 year old female with multiple medical comorbidities presented in the ED with nausea, vomiting, and weakness. Patient has been off Synthroid for more than a month.  She has also missed at least 4 days of her Lamictal and lithium. Daughter noted decreased activity, always feeling cold and wanting blankets, poor appetite, and more recently difficulty walking and having trouble with her balance. On arrival she was found to be bradycardic with heart rate in the 40s to 50s, hypotensive with systolic blood pressure in the 70s, hypothermic with temperature of 92.3. She was lethargic but oriented x 3. Significant lab abnormalities in ED,  nonanion gap metabolic acidosis, and mild AKI.  initial TSH was 67.01. She was administered IV fluids, cultures were sent, cortisol sent, free T and free T4 sent, stress dose steroids initiated, and because of her ongoing hypotension,  norepinephrine infusion was started and critical care asked to admit.  Patient was managed in the ICU initially and then subsequently transferred to medical floor.  She has made significant improvement.  IV medications switched  to oral. She reports abdominal pain,  nausea and vomiting has improved.  She is tolerating levothyroxine.  Steroid tapering initiated.  Patient feels better,  wants to be discharged.  Home health services arranged.  Patient being discharged home.   Discharge Diagnoses:  Principal Problem:   Myxedema coma (HCC)   Abdominal Pain  Nausea: > Resolved. CT abdomen and pelvis > No acute abnormalities found. Synthroid IV changed to oral, start tapering steroids. Continue IV Zofran as needed and pain control. She reports abdominal pain has improved.   Shock due to symptomatic bradycardia related to myxedema and electrolyte abnormalities: She remains off pressors now. Continue Oral rehydration Continue to hold beta-blocker Discontinue Antibiotics.   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 appreciate cardiology assistance - see 10/5 note   Myxedema coma w/ hypothermia: Has not taken her medications for several weeks.  Has a history of noncompliance.   She was Bradycardic, hypothermic , hypotensive, lethargic with TSH almost 70. She received loading dose of levothyroxine, continue 100 mcg daily Continue levothyroxine 125 mcg daily. Start tapering steroids. Advised to follow-up with endocrinology, appointment made.   Acute metabolic encephalopathy:> Resolved. Hold PTA lithium today.  Restarted Lamictal. PT and OT evaluation. Reorientation. Continue Synthroid Continue delirium precautions   AKI > Resolved. Avoid nephrotoxic medications,  continue to trend.   Hypokalemia: Replaced. Continue to monitor.   Hypophosphatemia, resolved Continue to monitor.   N&V: Continue Antiemetics as needed.   GERD Continue PPI.   UTI Urine culture shows no  growth.  Antibiotic discontinued

## 2023-01-22 ENCOUNTER — Inpatient Hospital Stay: Payer: Medicare HMO | Admitting: Physician Assistant

## 2023-02-02 ENCOUNTER — Other Ambulatory Visit: Payer: Self-pay | Admitting: Nurse Practitioner

## 2023-02-02 NOTE — Telephone Encounter (Signed)
Medication Refill -  Most Recent Primary Care Visit:  Provider: Bertram Denver W  Department: [facility]  Visit Type: OFFICE VISIT  Date: 01/21/2021  Medication: levothyroxine (SYNTHROID) 150 MCG tablet [[id number]]  ENDED   Has the patient contacted their pharmacy? Yes (Agent: If no, request that the patient contact the pharmacy for the refill. If patient does not wish to contact the pharmacy document the reason why and proceed with request.) (Agent: If yes, when and what did the pharmacy advise?)  Is this the correct pharmacy for this prescription? Yes If no, delete pharmacy and type the correct one.  This is the patient's preferred pharmacy:  [id number] - [place], [place] - [address] [place] [address] Phone: [phone]  Fax: [phone]      Has the prescription been filled recently? Yes   Is the patient out of the medication? Yes Pt is out of medication. Last pill taken yesterday.  Has the patient been seen for an appointment in the last year OR does the patient have an upcoming appointment? Yes 02/05/23  Can we respond through MyChart? No  Agent: Please be advised that Rx refills may take up to 3 business days. We ask that you follow-up with your pharmacy.

## 2023-02-03 MED ORDER — LEVOTHYROXINE SODIUM 150 MCG PO TABS: 150.0000 ug | ORAL_TABLET | Freq: Every day | ORAL | 0 refills | Status: DC

## 2023-02-03 NOTE — Telephone Encounter (Signed)
Requested medication (s) are due for refill today: yes  Requested medication (s) are on the active medication list: yes  Last refill:  12/27/22  Future visit scheduled: yes 02/05/23  Notes to clinic:  Unable to refill per protocol, last refill by another provider.      Requested Prescriptions  Pending Prescriptions Disp Refills   levothyroxine (SYNTHROID) 150 MCG tablet 30 tablet 0    Sig: Take 1 tablet (150 mcg total) by mouth daily at 6 (six) AM.     Endocrinology:  Hypothyroid Agents Failed - 02/02/2023  1:12 PM      Failed - TSH in normal range and within 360 days    TSH  Date Value Ref Range Status  12/24/2022 39.872 (H) 0.350 - 4.500 uIU/mL Final    Comment:    Performed by a 3rd Generation assay with a functional sensitivity of <=0.01 uIU/mL. Performed at [facility], [address], [place], [place] [address]   01/21/2021 87.200 (H) 0.450 - 4.500 uIU/mL Final         Failed - Valid encounter within last 12 months    Recent Outpatient Visits           2 years ago Acquired hypothyroidism   [facility] [place] Claiborne Rigg, NP   4 years ago Acquired hypothyroidism   [facility] [place]. [facility] Claiborne Rigg, NP   4 years ago Acquired hypothyroidism   [facility] [place]. [facility] [place], [place], [place]   5 years ago Acquired hypothyroidism   [facility] Of [place]. [facility] Claiborne Rigg, NP   5 years ago Upper respiratory tract infection, unspecified type   [facility] [facility] Of [place]. [facility] [place], [place] Reeves R, FNP       Future Appointments             In 2 days Sharon Seller, [place] [facility] [place] [facility] Of [place]. [facility]

## 2023-02-05 ENCOUNTER — Encounter: Payer: Self-pay | Admitting: Physician Assistant

## 2023-02-05 ENCOUNTER — Ambulatory Visit: Payer: Medicare HMO | Attending: Physician Assistant | Admitting: Physician Assistant

## 2023-02-05 VITALS — BP 102/71 | HR 89 | Wt 143.6 lb

## 2023-02-05 DIAGNOSIS — E039 Hypothyroidism, unspecified: Secondary | ICD-10-CM | POA: Diagnosis not present

## 2023-02-05 DIAGNOSIS — L814 Other melanin hyperpigmentation: Secondary | ICD-10-CM

## 2023-02-05 DIAGNOSIS — E559 Vitamin D deficiency, unspecified: Secondary | ICD-10-CM | POA: Diagnosis not present

## 2023-02-05 DIAGNOSIS — E876 Hypokalemia: Secondary | ICD-10-CM

## 2023-02-05 DIAGNOSIS — E035 Myxedema coma: Secondary | ICD-10-CM | POA: Diagnosis not present

## 2023-02-05 DIAGNOSIS — F3162 Bipolar disorder, current episode mixed, moderate: Secondary | ICD-10-CM

## 2023-02-05 DIAGNOSIS — Z91199 Patient's noncompliance with other medical treatment and regimen due to unspecified reason: Secondary | ICD-10-CM

## 2023-02-05 DIAGNOSIS — Z23 Encounter for immunization: Secondary | ICD-10-CM

## 2023-02-05 DIAGNOSIS — Z09 Encounter for follow-up examination after completed treatment for conditions other than malignant neoplasm: Secondary | ICD-10-CM | POA: Diagnosis not present

## 2023-02-05 DIAGNOSIS — E785 Hyperlipidemia, unspecified: Secondary | ICD-10-CM | POA: Diagnosis not present

## 2023-02-05 MED ORDER — LEVOTHYROXINE SODIUM 150 MCG PO TABS
150.0000 ug | ORAL_TABLET | Freq: Every day | ORAL | 2 refills | Status: DC
Start: 2023-02-05 — End: 2023-02-09

## 2023-02-05 MED ORDER — ATORVASTATIN CALCIUM 20 MG PO TABS: 20.0000 mg | ORAL_TABLET | Freq: Every day | ORAL | 1 refills | Status: DC

## 2023-02-05 MED ORDER — LAMOTRIGINE 200 MG PO TABS: 200.0000 mg | ORAL_TABLET | Freq: Two times a day (BID) | ORAL | 3 refills | Status: DC

## 2023-02-05 NOTE — Patient Instructions (Addendum)
Follow up with [place], MD.  Please call and schedule appt ASAP

## 2023-02-05 NOTE — Progress Notes (Signed)
Patient ID: Rachel Everett, female   DOB: [birth year], 62 y.o.   MRN: [id number]    Meiko Defelice, is a 62 y.o. female  HYQ:[id number]  [id number]  DOB - Aug 05, 1960  Chief Complaint  Patient presents with   Hospitalization Follow-up   Medication Refill       Subjective:   Rachel Everett is a 62 y.o. female here today for a follow up visit After hospitalization 10/5-10/12 with cardiogenic shock subsequent to not taking her thyroid medication.  She did not keep her endocrinology follow up appt and missed her original hospital follow up appt.  She says she has been compliant with medications.  She has also not seen her psychiatrist in about a year.  Her daughter is with her and trying to keep her on track with her medications.  She denies palpitations or chest pain.  She is worried about age spots on her arms and wants to see a dermatologist to make sure she does not have skin CA.  She says she doesn't feel great overall but is much better than she was leading up to her hospitalization.  Appetite is fair.  She denies SI/HI.    From discharge summary: Recommendations for Outpatient Follow-up:  Follow up with PCP in 1-2 weeks. Please obtain BMP/CBC in one week Advised to follow-up with Endocrinologist Dr. Roosevelt Locks in 1 to 2 weeks. Advised to take Cortef 20 mg daily for 2 days followed by Cortef 10 mg daily for 1 dose. Advised to take levothyroxine 150 mcg daily. Advised to take potassium chloride 40 mg twice daily for 3 days. Advised to follow-up with PCP to readjust lithium and propranolol dosing.  Brief Summary/ Hospital Course: This 62 year old female with multiple medical comorbidities presented in the ED with nausea, vomiting, and weakness. Patient has been off Synthroid for more than a month.  She has also missed at least 4 days of her Lamictal and lithium. Daughter noted decreased activity, always feeling cold and wanting blankets, poor appetite, and more recently difficulty walking and  having trouble with her balance. On arrival she was found to be bradycardic with heart rate in the 40s to 50s, hypotensive with systolic blood pressure in the 70s, hypothermic with temperature of 92.3. She was lethargic but oriented x 3. Significant lab abnormalities in ED,  nonanion gap metabolic acidosis, and mild AKI.  initial TSH was 67.01. She was administered IV fluids, cultures were sent, cortisol sent, free T and free T4 sent, stress dose steroids initiated, and because of her ongoing hypotension,  norepinephrine infusion was started and critical care asked to admit.  Patient was managed in the ICU initially and then subsequently transferred to medical floor.  She has made significant improvement.  IV medications switched to oral. She reports abdominal pain,  nausea and vomiting has improved.  She is tolerating levothyroxine.  Steroid tapering initiated.  Patient feels better,  wants to be discharged.  Home health services arranged.  Patient being discharged home.   Discharge Diagnoses:  Principal Problem:   Myxedema coma (HCC)   Abdominal Pain  Nausea: > Resolved. CT abdomen and pelvis > No acute abnormalities found. Synthroid IV changed to oral, start tapering steroids. Continue IV Zofran as needed and pain control. She reports abdominal pain has improved.   Shock due to symptomatic bradycardia related to myxedema and electrolyte abnormalities: She remains off pressors now. Continue Oral rehydration Continue to hold beta-blocker Discontinue Antibiotics.   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 appreciate cardiology assistance - see 10/5 note   Myxedema coma w/ hypothermia: Has not taken her medications for several weeks.  Has a history of noncompliance.   She was Bradycardic, hypothermic , hypotensive, lethargic with TSH  almost 70. She received loading dose of levothyroxine, continue 100 mcg daily Continue levothyroxine 125 mcg daily. Start tapering steroids. Advised to follow-up with endocrinology, appointment made.   Acute metabolic encephalopathy:> Resolved. Hold PTA lithium today.  Restarted Lamictal. PT and OT evaluation. Reorientation. Continue Synthroid Continue delirium precautions   AKI > Resolved. Avoid nephrotoxic medications,  continue to trend.   Hypokalemia: Replaced. Continue to monitor.   Hypophosphatemia, resolved Continue to monitor.   N&V: Continue Antiemetics as needed.   GERD Continue PPI.   UTI Urine culture shows no growth.  Antibiotic discontinued No problems updated.  ALLERGIES: No Known Allergies  PAST MEDICAL HISTORY: Past Medical History:  Diagnosis Date   Anxiety    Bipolar 1 disorder (HCC)    Depression    GERD (gastroesophageal reflux disease)    Hyperlipidemia    SVD (spontaneous vaginal delivery)    x 1   Thyroid disease     MEDICATIONS AT HOME: Prior to Admission medications   Medication Sig Start Date End Date Taking? Authorizing Provider  hydrOXYzine (ATARAX) 50 MG tablet Take 50 mg by mouth 2 (two) times daily. 12/15/22  Yes [provider]  Vitamin D, Ergocalciferol, (DRISDOL) 1.25 MG (50000 UNIT) CAPS capsule Take 50,000 Units by mouth every 7 (seven) days.   Yes [provider]  atorvastatin (LIPITOR) 20 MG tablet Take 1 tablet (20 mg total) by mouth daily. 02/05/23   Anders Simmonds, PA-C  lamoTRIgine (LAMICTAL) 200 MG tablet Take 1 tablet (200 mg total) by mouth 2 (two) times daily. 02/05/23   Anders Simmonds, PA-C  levothyroxine (SYNTHROID) 150 MCG tablet Take 1 tablet (150 mcg total) by mouth daily at 6 (six) AM. 02/05/23 03/07/23  Adalie Mand, Marzella Schlein, PA-C  pantoprazole (PROTONIX) 40 MG tablet Take 1 tablet (40 mg total) by mouth 2 (two) times daily. 12/27/22 01/26/23  Willeen Niece, MD    ROS: Neg  HEENT Neg resp Neg cardiac Neg GI Neg GU Neg psych Neg neuro  Objective:   Vitals:   02/05/23 1422  BP: 102/71  Pulse: 89  SpO2: 97%  Weight: 143 lb 9.6 oz (65.1 kg)   Exam General appearance : Awake, alert, not in any distress. Speech Clear. Not toxic looking HEENT: Atraumatic and Normocephalic Neck: Supple, no JVD. No cervical lymphadenopathy.  Chest: Good air entry bilaterally, CTAB.  No rales/rhonchi/wheezing CVS: S1 S2 regular, no murmurs.  Extremities: B/L Lower Ext shows no edema, both legs are warm to touch Neurology: Awake alert, and oriented X 3, CN II-XII intact, Non focal Skin: No Rash.  Age related spots on arms.  Nothing of immediate concern  Data Review Lab Results  Component Value Date   HGBA1C 5.0 05/14/2016    Assessment & Plan   1. Myxedema coma (HCC) Resolved.  She has missed f/up appts.  Make appt with endocrine ASAP.   - levothyroxine (SYNTHROID) 150 MCG tablet; Take 1 tablet (150 mcg total) by mouth daily at 6 (six) AM.  Dispense: 30 tablet; Refill: 2 - Thyroid Panel With TSH - CBC with Differential  2. Dyslipidemia, goal LDL below 100  - atorvastatin (LIPITOR) 20 MG tablet; Take 1 tablet (20  mg total) by mouth daily.  Dispense: 90 tablet; Refill: 1  3. Age spots  - Ambulatory referral to Dermatology  4. Hospital discharge follow-up  - Thyroid Panel With TSH  5. Vitamin D deficiency Taking weekly on Mondays - Vitamin D, 25-hydroxy  6. Personal history of noncompliance with medical treatment She is reporting compliance currently  7. Acquired hypothyroidism  - levothyroxine (SYNTHROID) 150 MCG tablet; Take 1 tablet (150 mcg total) by mouth daily at 6 (six) AM.  Dispense: 30 tablet; Refill: 2 - Thyroid Panel With TSH - CBC with Differential  8. Bipolar disorder, current episode mixed, moderate (HCC)  - lamoTRIgine (LAMICTAL) 200 MG tablet; Take 1 tablet (200 mg total) by mouth 2 (two) times daily.  Dispense: 60 tablet; Refill:  3 - Lamotrigine level - CBC with Differential - Ambulatory referral to Psychiatry  9. Hypokalemia - Basic Metabolic Panel   Return in about 3 months (around 05/08/2023), or if symptoms worsen or fail to improve, for PCP for chronic conditions.  The patient was given clear instructions to go to ER or return to medical center if symptoms don't improve, worsen or new problems develop. The patient verbalized understanding. The patient was told to call to get lab results if they haven't heard anything in the next week.      Georgian Co, PA-C [facility] [place], [place] [phone]   02/05/2023, 2:49 PM

## 2023-02-06 ENCOUNTER — Telehealth: Payer: Self-pay

## 2023-02-06 LAB — CBC WITH DIFFERENTIAL/PLATELET
Basophils Absolute: 0.1 10*3/uL (ref 0.0–0.2)
Basos: 1 %
EOS (ABSOLUTE): 0 10*3/uL (ref 0.0–0.4)
Eos: 0 %
Hematocrit: 41.2 % (ref 34.0–46.6)
Hemoglobin: 13.3 g/dL (ref 11.1–15.9)
Immature Grans (Abs): 0 10*3/uL (ref 0.0–0.1)
Immature Granulocytes: 0 %
Lymphocytes Absolute: 1.7 10*3/uL (ref 0.7–3.1)
Lymphs: 18 %
MCH: 30.6 pg (ref 26.6–33.0)
MCHC: 32.3 g/dL (ref 31.5–35.7)
MCV: 95 fL (ref 79–97)
Monocytes Absolute: 0.6 10*3/uL (ref 0.1–0.9)
Monocytes: 7 %
Neutrophils Absolute: 6.7 10*3/uL (ref 1.4–7.0)
Neutrophils: 74 %
Platelets: 356 10*3/uL (ref 150–450)
RBC: 4.34 x10E6/uL (ref 3.77–5.28)
RDW: 12 % (ref 11.7–15.4)
WBC: 9.1 10*3/uL (ref 3.4–10.8)

## 2023-02-06 LAB — LAMOTRIGINE LEVEL: Lamotrigine Lvl: 10.2 ug/mL (ref 2.0–20.0)

## 2023-02-06 LAB — THYROID PANEL WITH TSH
Free Thyroxine Index: 4.1 (ref 1.2–4.9)
T3 Uptake Ratio: 27 % (ref 24–39)
T4, Total: 15.1 ug/dL — ABNORMAL HIGH (ref 4.5–12.0)
TSH: 0.237 u[IU]/mL — ABNORMAL LOW (ref 0.450–4.500)

## 2023-02-06 LAB — BASIC METABOLIC PANEL
BUN/Creatinine Ratio: 11 — ABNORMAL LOW (ref 12–28)
BUN: 9 mg/dL (ref 8–27)
CO2: 23 mmol/L (ref 20–29)
Calcium: 10 mg/dL (ref 8.7–10.3)
Chloride: 107 mmol/L — ABNORMAL HIGH (ref 96–106)
Creatinine, Ser: 0.79 mg/dL (ref 0.57–1.00)
Glucose: 102 mg/dL — ABNORMAL HIGH (ref 70–99)
Potassium: 3.8 mmol/L (ref 3.5–5.2)
Sodium: 147 mmol/L — ABNORMAL HIGH (ref 134–144)
eGFR: 84 mL/min/{1.73_m2} (ref >59)

## 2023-02-06 LAB — VITAMIN D 25 HYDROXY (VIT D DEFICIENCY, FRACTURES): Vit D, 25-Hydroxy: 41.3 ng/mL (ref 30.0–100.0)

## 2023-02-06 NOTE — Telephone Encounter (Signed)
Copied from CRM [id number]. Topic: General - Other >> Feb 06, 2023  3:24 PM Phill Myron wrote: Pt [place] ask that you call her today regarding her results. Please advise

## 2023-02-09 ENCOUNTER — Ambulatory Visit: Payer: Self-pay

## 2023-02-09 ENCOUNTER — Other Ambulatory Visit: Payer: Self-pay | Admitting: Nurse Practitioner

## 2023-02-09 ENCOUNTER — Other Ambulatory Visit: Payer: Self-pay | Admitting: Physician Assistant

## 2023-02-09 ENCOUNTER — Other Ambulatory Visit: Payer: Self-pay

## 2023-02-09 DIAGNOSIS — K219 Gastro-esophageal reflux disease without esophagitis: Secondary | ICD-10-CM

## 2023-02-09 MED ORDER — LEVOTHYROXINE SODIUM 125 MCG PO TABS: 125.0000 ug | ORAL_TABLET | Freq: Every day | ORAL | 1 refills | Status: DC

## 2023-02-09 MED ORDER — PANTOPRAZOLE SODIUM 40 MG PO TBEC: 40.0000 mg | DELAYED_RELEASE_TABLET | Freq: Two times a day (BID) | ORAL | 0 refills | Status: DC

## 2023-02-09 NOTE — Telephone Encounter (Signed)
  pantoprazole (PROTONIX) 40 MG tablet sent to pharmacy.

## 2023-02-09 NOTE — Telephone Encounter (Signed)
Medication Refill -  Most Recent Primary Care Visit:  Provider: Anders Simmonds  Department: [facility]  Visit Type: HOSPITAL FU  Date: 02/05/2023  Medication: pantoprazole (PROTONIX) EC tablet 40 mg   Has the patient contacted their pharmacy? No (   Is this the correct pharmacy for this prescription? Yes If no, delete pharmacy and type the correct one.  This is the patient's preferred pharmacy:    [facility] [place], [place] - [address] [place] [address] Phone: [phone] Fax: [phone]   Has the prescription been filled recently? Yes  Is the patient out of the medication? Yes  Has the patient been seen for an appointment in the last year OR does the patient have an upcoming appointment? Yes  Can we respond through MyChart? Yes  Agent: Please be advised that Rx refills may take up to 3 business days. We ask that you follow-up with your pharmacy.

## 2023-02-09 NOTE — Telephone Encounter (Signed)
  Chief Complaint: Medication:   pantoprazole (PROTONIX) 40 MG tablet   Symptoms: Patient states she is missing this medication   Disposition: [] ED /[] Urgent Care (no appt availability in office) / [] Appointment(In office/virtual)/ []  [facility]/ [] Home Care/ [] Refused Recommended Disposition /[] [facility]/ [x]  Follow-up with PCP Additional Notes: Patient states this medication should be refilled- it is on her active medication list from discharge from hospital.    Reason for Disposition . [1] Caller has URGENT medicine question about med that PCP or specialist prescribed AND [2] triager unable to answer question  Answer Assessment - Initial Assessment Questions 1. NAME of MEDICINE: "What medicine(s) are you calling about?"      pantoprazole (PROTONIX) 40 MG tablet 2. QUESTION: "What is your question?" (e.g., double dose of medicine, side effect)     Patient states she needs RF 3. PRESCRIBER: "Who prescribed the medicine?" Reason: if prescribed by specialist, call should be referred to that group.     PCP 4. SYMPTOMS: "Do you have any symptoms?" If Yes, ask: "What symptoms are you having?"  "How bad are the symptoms (e.g., mild, moderate, severe)     Reflux medictaion  Protocols used: Medication Question Call-A-AH

## 2023-02-09 NOTE — Telephone Encounter (Signed)
Summary: would like to discuss medications   Pt. Rachel Everett would like to a call back to discuss medication she should and should not be taking according to recent hospital visit.      Called pt - left message on machine to return call.

## 2023-02-10 ENCOUNTER — Telehealth: Payer: Self-pay

## 2023-02-10 NOTE — Telephone Encounter (Signed)
-----   Message from Georgian Co sent at 02/09/2023 10:24 AM EST ----- Your thyroid medication needs adjustment.  I have sent you a new prescription for levothyroxine daily for now.  (Stop the for now).  Your lamictal is at a therapeutic dose, continue current dose.  Vitamin D is normal so you no longer need prescription strength(the once weekly).  No you can just take over the counter Vitamin D 2000 units daily.  Increase your water intake bc your sodium is a little elevated.  Schedule an appointment with your PCP(Zelda) for about 2 to 3 months from now to recheck Thyroid levels.  Melvyn Novas, PA-C

## 2023-02-10 NOTE — Telephone Encounter (Signed)
Patient viewed results and Doctor comment through [facility]

## 2023-03-04 ENCOUNTER — Telehealth: Payer: Self-pay | Admitting: *Deleted

## 2023-03-04 NOTE — Telephone Encounter (Signed)
Noted.  Patient can address request and discuss with PCP at appointment.

## 2023-03-04 NOTE — Telephone Encounter (Signed)
Copied from CRM [id number]. Topic: General - Other >> Mar 04, 2023 12:44 PM Epimenio Foot F wrote: Reason for CRM: Pt is calling in because she has an appointment in January and she wants to have labs done.

## 2023-03-31 ENCOUNTER — Ambulatory Visit: Payer: Medicare HMO

## 2023-04-20 ENCOUNTER — Other Ambulatory Visit: Payer: Self-pay | Admitting: Family Medicine

## 2023-04-20 DIAGNOSIS — E079 Disorder of thyroid, unspecified: Secondary | ICD-10-CM

## 2023-04-22 ENCOUNTER — Ambulatory Visit: Payer: Medicare HMO | Admitting: Nurse Practitioner

## 2023-05-13 ENCOUNTER — Ambulatory Visit: Payer: Medicare PPO | Attending: Nurse Practitioner | Admitting: Nurse Practitioner

## 2023-05-13 ENCOUNTER — Encounter: Payer: Self-pay | Admitting: Nurse Practitioner

## 2023-05-13 ENCOUNTER — Other Ambulatory Visit (HOSPITAL_COMMUNITY)
Admission: RE | Admit: 2023-05-13 | Discharge: 2023-05-13 | Disposition: A | Discharge status: Home / Self Care | Payer: Medicare PPO | Source: Ambulatory Visit | Attending: Nurse Practitioner | Admitting: Nurse Practitioner

## 2023-05-13 VITALS — BP 103/68 | HR 80 | Resp 18 | Ht 66.0 in | Wt 129.8 lb

## 2023-05-13 DIAGNOSIS — Z1231 Encounter for screening mammogram for malignant neoplasm of breast: Secondary | ICD-10-CM

## 2023-05-13 DIAGNOSIS — H43391 Other vitreous opacities, right eye: Secondary | ICD-10-CM

## 2023-05-13 DIAGNOSIS — E039 Hypothyroidism, unspecified: Secondary | ICD-10-CM | POA: Diagnosis not present

## 2023-05-13 DIAGNOSIS — Z113 Encounter for screening for infections with a predominantly sexual mode of transmission: Secondary | ICD-10-CM | POA: Insufficient documentation

## 2023-05-13 DIAGNOSIS — E782 Mixed hyperlipidemia: Secondary | ICD-10-CM

## 2023-05-13 DIAGNOSIS — N760 Acute vaginitis: Secondary | ICD-10-CM | POA: Insufficient documentation

## 2023-05-13 DIAGNOSIS — R7989 Other specified abnormal findings of blood chemistry: Secondary | ICD-10-CM | POA: Diagnosis not present

## 2023-05-13 DIAGNOSIS — R309 Painful micturition, unspecified: Secondary | ICD-10-CM | POA: Diagnosis not present

## 2023-05-13 MED ORDER — FLUCONAZOLE 150 MG PO TABS: 150.0000 mg | ORAL_TABLET | ORAL | 0 refills | Status: DC

## 2023-05-13 NOTE — Progress Notes (Signed)
 Possible daily medication to prevent uti or yeast infection No symptoms, oil on urine...... Unintentional weight loss Black floaters in left eye

## 2023-05-13 NOTE — Progress Notes (Signed)
 I have seen and examined this patient with the advanced practice provider STUDENT and agree with the note below

## 2023-05-13 NOTE — Progress Notes (Signed)
 Assessment & Plan:  Rachel Everett was seen today for medical management of chronic issues.  Diagnoses and all orders for this visit:  Acquired hypothyroidism -     Thyroid Panel With TSH Continue Levothyroxine 125 mcg every day Take medication as prescribed Has appt with Endo next week  Mixed hyperlipidemia -     Lipid Panel Continue Atorvastatin 20 mg every day INSTRUCTIONS: Work on a low fat, heart healthy diet and participate in regular aerobic exercise program by working out at least 150 minutes per week; 5 days a week-30 minutes per day. Avoid red meat/beef/steak,  fried foods. junk foods, sodas, sugary drinks, unhealthy snacking, alcohol and smoking.  Drink at least 80 oz of water per day and monitor your carbohydrate intake daily.    Abnormal CBC -     CBC  Vitreous floaters of right eye -     Ambulatory referral to Ophthalmology   Encounter for screening mammogram for malignant neoplasm of breast -     MS 3D SCR MAMMO BILAT BR (aka MM); Future  Painful urination -     Urinalysis, Complete -     POCT URINALYSIS DIP (CLINITEK) Drink 64 ounces of water Complete emptying of bladder when urinating   Acute vaginitis -     Cervicovaginal ancillary only -     fluconazole (DIFLUCAN) 150 MG tablet; Take 1 tablet (150 mg total) by mouth every 3 (three) days. Keep vaginal area clean and dry   Patient has been counseled on age-appropriate routine health concerns for screening and prevention. These are reviewed and up-to-date. Referrals have been placed accordingly. Immunizations are up-to-date or declined.    Subjective:   Chief Complaint  Patient presents with   Medical Management of Chronic Issues    Rachel Everett 63 y.o. female presents to office today for routine follow up on thyroid and reports having flank pain and cloudy urine. States she believes she has a UTI similar to symptoms she experienced in 01/2023 and was treated at a hospital in [place]. She has been taking OTC  remedies such as drinking cranberry juice and Monistat. Has concerns that UTI maybe a reoccurring issues with her current Hypothyroidism. She states she ran out of medication for 2 months and received refills when she was released from hospital. Denies dysuria, increased frequency or burning when urinating. She has a floater in her right eye. Denies pain, redness or discharge at eye. Has not seen ophthalmologist in a few years.  Last TSH level 3 months ago was low. Her thyroid dose was decreased at that time. This is the first time since then that we will be rechecking her thyroid level.       Review of Systems  Constitutional:  Positive for weight loss. Negative for fever.  HENT: Negative.    Eyes:  Negative for blurred vision.       Right eye floater  Respiratory: Negative.    Cardiovascular: Negative.   Gastrointestinal: Negative.   Genitourinary:  Positive for flank pain.       Urine cloudy  Musculoskeletal:  Negative for myalgias.  Neurological: Negative.   Endo/Heme/Allergies: Negative.   Psychiatric/Behavioral: Negative.      Past Medical History:  Diagnosis Date   Anxiety    Bipolar 1 disorder (HCC)    Depression    GERD (gastroesophageal reflux disease)    Hyperlipidemia    SVD (spontaneous vaginal delivery)    x 1   Thyroid disease     Past Surgical  History:  Procedure Laterality Date   ANKLE SURGERY     right    ANTERIOR CRUCIATE LIGAMENT REPAIR Left    BACK SURGERY     L5-S1   CHOLECYSTECTOMY     FRACTURE SURGERY Left    elbow   TENDON REPAIR Right    angle   TONSILLECTOMY      Family History  Problem Relation Age of Onset   Cancer Mother    Cancer Maternal Grandmother    Colon cancer Neg Hx    Esophageal cancer Neg Hx    Rectal cancer Neg Hx    Stomach cancer Neg Hx     Social History Reviewed with no changes to be made today.   Outpatient Medications Prior to Visit  Medication Sig Dispense Refill   atorvastatin (LIPITOR) 20 MG tablet Take  1 tablet (20 mg total) by mouth daily. 90 tablet 1   hydrOXYzine (ATARAX) 50 MG tablet Take 50 mg by mouth 2 (two) times daily.     lamoTRIgine (LAMICTAL) 200 MG tablet Take 1 tablet (200 mg total) by mouth 2 (two) times daily. 60 tablet 3   levothyroxine (SYNTHROID) 125 MCG tablet Take 1 tablet (125 mcg total) by mouth daily. New dose.  Stop for now 90 tablet 1   pantoprazole (PROTONIX) 40 MG tablet Take 1 tablet (40 mg total) by mouth 2 (two) times daily. 60 tablet 0   Vitamin D, Ergocalciferol, (DRISDOL) 1.25 MG (50000 UNIT) CAPS capsule Take 50,000 Units by mouth every 7 (seven) days. (Patient not taking: Reported on 05/13/2023)     No facility-administered medications prior to visit.    No Known Allergies     Objective:    BP 103/68 (BP Location: Left Arm, Patient Position: Sitting)   Pulse 80   Resp 18   Ht 5\' 6"  (1.676 m)   Wt 129 lb 12.8 oz (58.9 kg)   SpO2 96%   BMI 20.95 kg/m  Wt Readings from Last 3 Encounters:  05/13/23 129 lb 12.8 oz (58.9 kg)  02/05/23 143 lb 9.6 oz (65.1 kg)  12/25/22 161 lb 13.1 oz (73.4 kg)   . BP Readings from Last 3 Encounters:  05/13/23 103/68  02/05/23 102/71  12/27/22 118/83    Physical Exam Constitutional:      Appearance: Normal appearance.  HENT:     Head: Normocephalic.     Right Ear: External ear normal.     Left Ear: External ear normal.     Nose: Nose normal.  Eyes:     General: Lids are normal.        Right eye: No discharge.        Left eye: No discharge.  Neck:     Thyroid: No thyroid mass or thyroid tenderness.  Cardiovascular:     Rate and Rhythm: Normal rate.     Heart sounds: Normal heart sounds.  Pulmonary:     Effort: Pulmonary effort is normal.     Breath sounds: Normal breath sounds.  Musculoskeletal:        General: Normal range of motion.     Cervical back: Normal range of motion and neck supple.  Skin:    General: Skin is warm and dry.  Neurological:     General: No focal deficit present.      Mental Status: She is alert and oriented to person, place, and time.  Psychiatric:        Attention and Perception: Attention normal.  Mood and Affect: Mood normal.        Speech: Speech normal.        Behavior: Behavior normal. Behavior is cooperative.        Thought Content: Thought content normal.        Judgment: Judgment normal.        Patient has been counseled extensively about nutrition and exercise as well as the importance of adherence with medications and regular follow-up. The patient was given clear instructions to go to ER or return to medical center if symptoms don't improve, worsen or new problems develop. The patient verbalized understanding.   Follow-up: will call patient with follow up appointment  Joette Catching, BSN RN--FNP student [facility] [place], [place] [phone]   05/13/2023, 11:54 AM

## 2023-05-14 LAB — CERVICOVAGINAL ANCILLARY ONLY
Bacterial Vaginitis (gardnerella): POSITIVE — AB
Candida Glabrata: NEGATIVE
Candida Vaginitis: NEGATIVE
Chlamydia: NEGATIVE
Comment: NEGATIVE
Comment: NEGATIVE
Comment: NEGATIVE
Comment: NEGATIVE
Comment: NEGATIVE
Comment: NORMAL
Neisseria Gonorrhea: NEGATIVE
Trichomonas: NEGATIVE

## 2023-05-14 LAB — THYROID PANEL WITH TSH
Free Thyroxine Index: 3.8 (ref 1.2–4.9)
T3 Uptake Ratio: 28 % (ref 24–39)
T4, Total: 13.5 ug/dL — ABNORMAL HIGH (ref 4.5–12.0)
TSH: 0.012 u[IU]/mL — ABNORMAL LOW (ref 0.450–4.500)

## 2023-05-14 LAB — LIPID PANEL
Chol/HDL Ratio: 2.9 ratio (ref 0.0–4.4)
Cholesterol, Total: 162 mg/dL (ref 100–199)
HDL: 56 mg/dL (ref >39)
LDL Chol Calc (NIH): 86 mg/dL (ref 0–99)
Triglycerides: 113 mg/dL (ref 0–149)
VLDL Cholesterol Cal: 20 mg/dL (ref 5–40)

## 2023-05-14 LAB — CBC
Hematocrit: 42.5 % (ref 34.0–46.6)
Hemoglobin: 13.8 g/dL (ref 11.1–15.9)
MCH: 28.8 pg (ref 26.6–33.0)
MCHC: 32.5 g/dL (ref 31.5–35.7)
MCV: 89 fL (ref 79–97)
Platelets: 308 10*3/uL (ref 150–450)
RBC: 4.79 x10E6/uL (ref 3.77–5.28)
RDW: 13.1 % (ref 11.7–15.4)
WBC: 11.5 10*3/uL — ABNORMAL HIGH (ref 3.4–10.8)

## 2023-05-15 ENCOUNTER — Other Ambulatory Visit: Payer: Self-pay | Admitting: Nurse Practitioner

## 2023-05-15 ENCOUNTER — Telehealth: Payer: Self-pay | Admitting: Nurse Practitioner

## 2023-05-15 DIAGNOSIS — E079 Disorder of thyroid, unspecified: Secondary | ICD-10-CM

## 2023-05-15 DIAGNOSIS — B9689 Other specified bacterial agents as the cause of diseases classified elsewhere: Secondary | ICD-10-CM

## 2023-05-15 MED ORDER — METRONIDAZOLE 500 MG PO TABS: 500.0000 mg | ORAL_TABLET | Freq: Two times a day (BID) | ORAL | 0 refills | Status: AC

## 2023-05-15 MED ORDER — LEVOTHYROXINE SODIUM 112 MCG PO TABS: 112.0000 ug | ORAL_TABLET | Freq: Every day | ORAL | 1 refills | Status: DC

## 2023-05-15 NOTE — Telephone Encounter (Signed)
 S/W patient. She is aware she needs to return in 4 weeks to repeat TSH and levothyroxine dose is now 112mg  daily. Also sent in flagyl for BV. She will return for UA if symptoms are not improved with completing flagyl

## 2023-05-15 NOTE — Telephone Encounter (Signed)
 Copied from CRM [id number]. Topic: Clinical - Prescription Issue >> May 14, 2023  1:03 PM Elle L wrote: Reason for CRM: The patient states that she received a MyChart message from NP Bertram Denver stating that she would lower her levothyroxine (SYNTHROID) from 150 MCG to 125 MCG. However, the patient states that she is currently taking 125 MCG and is concerned with her lab levels and is requesting a lower dosage be sent in as soon as possible to [facility]. The patient's call back number is [phone]. >> May 15, 2023 10:55 AM Patsy Lager T wrote: Patient called again today to get the dosage of her Levothyroxine lowered from 125 MCG. Please f/u with patient

## 2023-05-17 ENCOUNTER — Encounter: Payer: Self-pay | Admitting: Nurse Practitioner

## 2023-05-17 ENCOUNTER — Other Ambulatory Visit: Payer: Self-pay | Admitting: Nurse Practitioner

## 2023-05-17 DIAGNOSIS — E079 Disorder of thyroid, unspecified: Secondary | ICD-10-CM

## 2023-05-17 DIAGNOSIS — R7989 Other specified abnormal findings of blood chemistry: Secondary | ICD-10-CM

## 2023-05-18 ENCOUNTER — Telehealth: Payer: Self-pay

## 2023-05-18 NOTE — Telephone Encounter (Signed)
 Patient is aware medication sent to [facility] 05/14/2022

## 2023-05-18 NOTE — Telephone Encounter (Signed)
 Copied from CRM [id number]. Topic: Clinical - Prescription Issue >> May 15, 2023  2:05 PM Izetta Dakin wrote: Reason for CRM: Patient called in requesting update on dosage change for Levothyroxine. Contacted CAL and was told to advise patient the medication is pending providers approval. Patient is requesting a callback as soon as possible.

## 2023-05-19 ENCOUNTER — Other Ambulatory Visit: Payer: Self-pay

## 2023-05-19 DIAGNOSIS — E039 Hypothyroidism, unspecified: Secondary | ICD-10-CM

## 2023-05-20 ENCOUNTER — Other Ambulatory Visit (HOSPITAL_BASED_OUTPATIENT_CLINIC_OR_DEPARTMENT_OTHER): Payer: Self-pay

## 2023-05-27 ENCOUNTER — Ambulatory Visit: Payer: Medicare HMO

## 2023-05-29 ENCOUNTER — Other Ambulatory Visit: Payer: Medicare PPO

## 2023-05-29 DIAGNOSIS — E039 Hypothyroidism, unspecified: Secondary | ICD-10-CM | POA: Diagnosis not present

## 2023-05-29 LAB — TSH: TSH: 0.02 m[IU]/L — ABNORMAL LOW (ref 0.40–4.50)

## 2023-05-29 LAB — T4, FREE: Free T4: 1.7 ng/dL (ref 0.8–1.8)

## 2023-05-29 LAB — T3, FREE: T3, Free: 3.4 pg/mL (ref 2.3–4.2)

## 2023-06-02 ENCOUNTER — Other Ambulatory Visit (HOSPITAL_BASED_OUTPATIENT_CLINIC_OR_DEPARTMENT_OTHER): Payer: Self-pay

## 2023-06-03 ENCOUNTER — Encounter: Payer: Self-pay | Admitting: "Endocrinology

## 2023-06-03 ENCOUNTER — Ambulatory Visit (INDEPENDENT_AMBULATORY_CARE_PROVIDER_SITE_OTHER): Payer: Medicare PPO | Admitting: "Endocrinology

## 2023-06-03 VITALS — BP 120/80 | HR 87 | Ht 66.0 in | Wt 126.0 lb

## 2023-06-03 DIAGNOSIS — E039 Hypothyroidism, unspecified: Secondary | ICD-10-CM

## 2023-06-03 DIAGNOSIS — R634 Abnormal weight loss: Secondary | ICD-10-CM | POA: Diagnosis not present

## 2023-06-03 MED ORDER — LEVOTHYROXINE SODIUM 100 MCG PO TABS: 100.0000 ug | ORAL_TABLET | Freq: Every day | ORAL | 1 refills | Status: DC

## 2023-06-03 NOTE — Progress Notes (Signed)
 Outpatient Endocrinology Note Altamese [place], MD  06/03/23   Rachel Everett [birth year] [id number]  Referring Provider: Willeen Niece, MD Primary Care Provider: Claiborne Rigg, NP Subjective  No chief complaint on file.   Assessment & Plan  Diagnoses and all orders for this visit:  Hypothyroidism, unspecified type -     TSH(Reflex)  Weight loss  Other orders -     levothyroxine (SYNTHROID) 100 MCG tablet; Take 1 tablet (100 mcg total) by mouth daily.   Ranetta Armacost is currently taking levothyroxine 112 mcg qd. Patient is currently biochemically euthyroid.  Educated on thyroid axis.  Recommend the following: Take levothyroxine 100 every morning.  Advised to take levothyroxine first thing in the morning on empty stomach and wait at least 30 minutes to 1 hour before eating or drinking anything or taking any other medications. Space out levothyroxine by 4 hours from any acid reflux medication/fibrate/iron/calcium/multivitamin. Advised to take nutritional supplements in the evening. Repeat lab before next visit or sooner if symptoms of hyperthyroidism or hypothyroidism develop.  Notify us immediately in case of significant weight gain or loss. Counseled on compliance and follow up needs.  C/o significant weight loss recently Currently in iatrogenic hyperthyroidism state Decreased thyroid hormone levels    I have reviewed current medications, nurse's notes, allergies, vital signs, past medical and surgical history, family medical history, and social history for this encounter. Counseled patient on symptoms, examination findings, lab findings, imaging results, treatment decisions and monitoring and prognosis. The patient understood the recommendations and agrees with the treatment plan. All questions regarding treatment plan were fully answered.   Return in about 3 months (around 09/03/2023) for visit + labs before next visit.   Altamese [place], MD  06/03/23   I have  reviewed current medications, nurse's notes, allergies, vital signs, past medical and surgical history, family medical history, and social history for this encounter. Counseled patient on symptoms, examination findings, lab findings, imaging results, treatment decisions and monitoring and prognosis. The patient understood the recommendations and agrees with the treatment plan. All questions regarding treatment plan were fully answered.   History of Present Illness Rachel Everett is a 63 y.o. year old female who presents to our clinic with hypothyroidism diagnosed during 9s.    Symptoms suggestive of HYPOTHYROIDISM:  fatigue No weight gain No cold intolerance  No, feels variable body temperature with sudden changes  constipation  No  Symptoms suggestive of HYPERTHYROIDISM:  weight loss  Yes heat intolerance No hyperdefecation  No palpitations  No  Compressive symptoms:  dysphagia  No dysphonia  No positional dyspnea (especially with simultaneous arms elevation)  No  Smokes  No On biotin  No Personal history of head/neck surgery/irradiation  No   Physical Exam  BP 120/80   Pulse 87   Ht 5\' 6"  (1.676 m)   Wt 126 lb (57.2 kg)   SpO2 99%   BMI 20.34 kg/m  Constitutional: well developed, well nourished Head: normocephalic, atraumatic, no exophthalmos Eyes: sclera anicteric, no redness Neck: no thyromegaly, no thyroid tenderness; no nodules palpated Lungs: normal respiratory effort Neurology: alert and oriented, no fine hand tremor Skin: dry, no appreciable rashes Musculoskeletal: no appreciable defects Psychiatric: normal mood and affect  Allergies No Known Allergies  Current Medications Patient's Medications  New Prescriptions   LEVOTHYROXINE (SYNTHROID) 100 MCG TABLET    Take 1 tablet (100 mcg total) by mouth daily.  Previous Medications   ATORVASTATIN (LIPITOR) 20 MG TABLET    Take 1 tablet (  20 mg total) by mouth daily.   FLUCONAZOLE (DIFLUCAN) 150 MG TABLET    Take  1 tablet (150 mg total) by mouth every 3 (three) days.   HYDROXYZINE (ATARAX) 50 MG TABLET    Take 50 mg by mouth 2 (two) times daily.   LAMOTRIGINE (LAMICTAL) 200 MG TABLET    Take 1 tablet (200 mg total) by mouth 2 (two) times daily.   PANTOPRAZOLE (PROTONIX) 40 MG TABLET    Take 1 tablet (40 mg total) by mouth 2 (two) times daily.   VITAMIN D, ERGOCALCIFEROL, (DRISDOL) 1.25 MG (50000 UNIT) CAPS CAPSULE    Take 50,000 Units by mouth every 7 (seven) days.  Modified Medications   No medications on file  Discontinued Medications   LEVOTHYROXINE (SYNTHROID) 112 MCG TABLET    Take 1 tablet (112 mcg total) by mouth daily. New dose.  Stop for now    Past Medical History Past Medical History:  Diagnosis Date   Anxiety    Bipolar 1 disorder (HCC)    Depression    GERD (gastroesophageal reflux disease)    Hyperlipidemia    SVD (spontaneous vaginal delivery)    x 1   Thyroid disease     Past Surgical History Past Surgical History:  Procedure Laterality Date   ANKLE SURGERY     right    ANTERIOR CRUCIATE LIGAMENT REPAIR Left    BACK SURGERY     L5-S1   CHOLECYSTECTOMY     FRACTURE SURGERY Left    elbow   TENDON REPAIR Right    angle   TONSILLECTOMY      Family History family history includes Cancer in her maternal grandmother and mother.  Social History Social History   Socioeconomic History   Marital status: Single    Spouse name: Not on file   Number of children: Not on file   Years of education: Not on file   Highest education level: Not on file  Occupational History   Not on file  Tobacco Use   Smoking status: Former    Current packs/day: 0.00    Average packs/day: 0.5 packs/day for 1 year (0.5 ttl pk-yrs)    Types: Cigarettes    Start date: 10/04/2014    Quit date: 10/04/2015    Years since quitting: 7.6   Smokeless tobacco: Never  Vaping Use   Vaping status: Former  Substance and Sexual Activity   Alcohol use: Yes    Alcohol/week: 0.0 standard  drinks of alcohol    Comment: rare   Drug use: No   Sexual activity: Yes    Birth control/protection: Post-menopausal  Other Topics Concern   Not on file  Social History Narrative   Not on file   Social Drivers of Health   Financial Resource Strain: High Risk (05/13/2023)   Overall Financial Resource Strain (CARDIA)    Difficulty of Paying Living Expenses: Hard  Food Insecurity: No Food Insecurity (12/24/2022)   Hunger Vital Sign    Worried About Running Out of Food in the Last Year: Never true    Ran Out of Food in the Last Year: Never true  Transportation Needs: No Transportation Needs (12/24/2022)   PRAPARE - Administrator, Civil Service (Medical): No    Lack of Transportation (Non-Medical): No  Physical Activity: Inactive (05/13/2023)   Exercise Vital Sign    Days of Exercise per Week: 0 days    Minutes of Exercise per Session: 0 min  Stress: Stress Concern  Present (05/13/2023)   [employer] of Occupational Health - Occupational Stress Questionnaire    Feeling of Stress : Rather much  Social Connections: Unknown (05/13/2023)   Social Connection and Isolation Panel [NHANES]    Frequency of Communication with Friends and Family: Not on file    Frequency of Social Gatherings with Friends and Family: Twice a week    Attends Religious Services: Never    Database administrator or Organizations: No    Attends Banker Meetings: Never    Marital Status: Never married  Intimate Partner Violence: Not At Risk (12/24/2022)   Humiliation, Afraid, Rape, and Kick questionnaire    Fear of Current or Ex-Partner: No    Emotionally Abused: No    Physically Abused: No    Sexually Abused: No    Laboratory Investigations Lab Results  Component Value Date   TSH 0.02 (L) 05/29/2023   TSH 0.012 (L) 05/13/2023   TSH 0.237 (L) 02/05/2023   FREET4 1.7 05/29/2023   FREET4 0.41 (L) 12/24/2022   FREET4 0.40 (L) 12/22/2022     No results found for: "TSI"   No  components found for: "TRAB"   Lab Results  Component Value Date   CHOL 162 05/13/2023   Lab Results  Component Value Date   HDL 56 05/13/2023   Lab Results  Component Value Date   LDLCALC 86 05/13/2023   Lab Results  Component Value Date   TRIG 113 05/13/2023   Lab Results  Component Value Date   CHOLHDL 2.9 05/13/2023   Lab Results  Component Value Date   CREATININE 0.79 02/05/2023   No results found for: "GFR"    Component Value Date/Time   NA 147 (H) 02/05/2023 1449   K 3.8 02/05/2023 1449   CL 107 (H) 02/05/2023 1449   CO2 23 02/05/2023 1449   GLUCOSE 102 (H) 02/05/2023 1449   GLUCOSE 83 12/27/2022 0520   BUN 9 02/05/2023 1449   CREATININE 0.79 02/05/2023 1449   CREATININE 1.02 05/14/2016 1036   CALCIUM 10.0 02/05/2023 1449   PROT 6.1 (L) 12/24/2022 0536   PROT 6.8 01/21/2021 1145   ALBUMIN 3.6 12/24/2022 0536   ALBUMIN 4.5 01/21/2021 1145   AST 42 (H) 12/24/2022 0536   ALT 23 12/24/2022 0536   ALKPHOS 82 12/24/2022 0536   BILITOT 0.7 12/24/2022 0536   BILITOT 0.2 01/21/2021 1145   GFRNONAA 54 (L) 12/27/2022 0520   GFRNONAA 67 09/07/2014 1135   GFRAA >60 10/04/2019 1841   GFRAA 78 09/07/2014 1135      Latest Ref Rng & Units 02/05/2023    2:49 PM 12/27/2022    5:20 AM 12/25/2022    8:12 AM  BMP  Glucose 70 - 99 mg/dL 161  83  82   BUN 8 - 27 mg/dL 9  12  7    Creatinine 0.57 - 1.00 mg/dL 0.96  0.45  4.09   BUN/Creat Ratio 12 - 28 11     Sodium 134 - 144 mmol/L 147  140  141   Potassium 3.5 - 5.2 mmol/L 3.8  2.9  3.0   Chloride 96 - 106 mmol/L 107  108  110   CO2 20 - 29 mmol/L 23  20  22    Calcium 8.7 - 10.3 mg/dL 81.1  8.9  8.7        Component Value Date/Time   WBC 11.5 (H) 05/13/2023 1233   WBC 11.4 (H) 12/27/2022 0520   RBC 4.79 05/13/2023  1233   RBC 4.12 12/27/2022 0520   HGB 13.8 05/13/2023 1233   HCT 42.5 05/13/2023 1233   PLT 308 05/13/2023 1233   MCV 89 05/13/2023 1233   MCH 28.8 05/13/2023 1233   MCH 30.3 12/27/2022 0520    MCHC 32.5 05/13/2023 1233   MCHC 32.9 12/27/2022 0520   RDW 13.1 05/13/2023 1233   LYMPHSABS 1.7 02/05/2023 1449   MONOABS 0.4 12/24/2022 0536   EOSABS 0.0 02/05/2023 1449   BASOSABS 0.1 02/05/2023 1449      Parts of this note may have been dictated using voice recognition software. There may be variances in spelling and vocabulary which are unintentional. Not all errors are proofread. Please notify the Thereasa Parkin if any discrepancies are noted or if the meaning of any statement is not clear.

## 2023-06-04 ENCOUNTER — Ambulatory Visit
Admission: RE | Admit: 2023-06-04 | Discharge: 2023-06-04 | Disposition: A | Discharge status: Home / Self Care | Payer: Medicare PPO | Source: Ambulatory Visit | Attending: Nurse Practitioner

## 2023-06-04 DIAGNOSIS — Z1231 Encounter for screening mammogram for malignant neoplasm of breast: Secondary | ICD-10-CM | POA: Diagnosis not present

## 2023-06-09 ENCOUNTER — Other Ambulatory Visit: Payer: Self-pay | Admitting: Nurse Practitioner

## 2023-06-09 DIAGNOSIS — R928 Other abnormal and inconclusive findings on diagnostic imaging of breast: Secondary | ICD-10-CM

## 2023-06-16 ENCOUNTER — Other Ambulatory Visit (HOSPITAL_BASED_OUTPATIENT_CLINIC_OR_DEPARTMENT_OTHER): Payer: Self-pay

## 2023-06-16 ENCOUNTER — Ambulatory Visit: Payer: Medicare HMO

## 2023-06-17 ENCOUNTER — Other Ambulatory Visit: Payer: Self-pay | Admitting: Physician Assistant

## 2023-06-17 DIAGNOSIS — F3162 Bipolar disorder, current episode mixed, moderate: Secondary | ICD-10-CM

## 2023-06-24 ENCOUNTER — Other Ambulatory Visit: Payer: Self-pay | Admitting: Nurse Practitioner

## 2023-06-24 ENCOUNTER — Encounter: Payer: Self-pay | Admitting: Nurse Practitioner

## 2023-06-24 ENCOUNTER — Ambulatory Visit
Admission: RE | Admit: 2023-06-24 | Discharge: 2023-06-24 | Disposition: A | Discharge status: Home / Self Care | Source: Ambulatory Visit | Attending: Nurse Practitioner | Admitting: Nurse Practitioner

## 2023-06-24 DIAGNOSIS — R599 Enlarged lymph nodes, unspecified: Secondary | ICD-10-CM

## 2023-06-24 DIAGNOSIS — R928 Other abnormal and inconclusive findings on diagnostic imaging of breast: Secondary | ICD-10-CM

## 2023-06-24 DIAGNOSIS — R590 Localized enlarged lymph nodes: Secondary | ICD-10-CM | POA: Diagnosis not present

## 2023-06-25 ENCOUNTER — Other Ambulatory Visit: Payer: Self-pay | Admitting: Nurse Practitioner

## 2023-06-25 ENCOUNTER — Ambulatory Visit
Admission: RE | Admit: 2023-06-25 | Discharge: 2023-06-25 | Discharge status: Home / Self Care | Source: Ambulatory Visit | Attending: Nurse Practitioner | Admitting: Nurse Practitioner

## 2023-06-25 ENCOUNTER — Ambulatory Visit
Admission: RE | Admit: 2023-06-25 | Discharge: 2023-06-25 | Disposition: A | Discharge status: Home / Self Care | Source: Ambulatory Visit | Attending: Nurse Practitioner | Admitting: Nurse Practitioner

## 2023-06-25 DIAGNOSIS — R599 Enlarged lymph nodes, unspecified: Secondary | ICD-10-CM

## 2023-06-25 DIAGNOSIS — R590 Localized enlarged lymph nodes: Secondary | ICD-10-CM | POA: Diagnosis not present

## 2023-06-25 DIAGNOSIS — F3162 Bipolar disorder, current episode mixed, moderate: Secondary | ICD-10-CM

## 2023-06-25 MED ORDER — LAMOTRIGINE 200 MG PO TABS: 200.0000 mg | ORAL_TABLET | Freq: Two times a day (BID) | ORAL | 2 refills | Status: DC

## 2023-06-25 NOTE — Telephone Encounter (Signed)
 Copied from CRM [id number]. Topic: Clinical - Medication Refill >> Jun 25, 2023  9:16 AM Elle L wrote: Most Recent Primary Care Visit:  Provider: Bertram Denver W  Department: [facility]  Visit Type: OFFICE VISIT  Date: 05/13/2023  Medication: hydrOXYzine (ATARAX) 50 MG tablet AND lamoTRIgine (LAMICTAL) 200 MG tablet    Has the patient contacted their pharmacy? Yes  Is this the correct pharmacy for this prescription? Yes  This is the patient's preferred pharmacy:  [facility], [place] - [address] [place] [address] Phone: [phone] Fax: [phone]  Has the prescription been filled recently? No  Is the patient out of the medication? Yes, 1 day left.   Has the patient been seen for an appointment in the last year OR does the patient have an upcoming appointment? Yes  Can we respond through MyChart? Yes  Agent: Please be advised that Rx refills may take up to 3 business days. We ask that you follow-up with your pharmacy.

## 2023-06-25 NOTE — Telephone Encounter (Signed)
 Requested medications are due for refill today.  unsure  Requested medications are on the active medications list.  Yes as historical  Last refill. 12/15/2022   Future visit scheduled.   no  Notes to clinic.  Medication is historical.    Requested Prescriptions  Pending Prescriptions Disp Refills   hydrOXYzine (ATARAX) 50 MG tablet 60 tablet 2    Sig: Take 1 tablet (50 mg total) by mouth 2 (two) times daily.     Ear, Nose, and Throat:  Antihistamines 2 Passed - 06/25/2023  4:31 PM      Passed - Cr in normal range and within 360 days    Creat  Date Value Ref Range Status  05/14/2016 1.02 0.50 - 1.05 mg/dL Final    Comment:      For patients > or = 64 years of age: The upper reference limit for Creatinine is approximately 13% higher for people identified as African-American.      Creatinine, Ser  Date Value Ref Range Status  02/05/2023 0.79 0.57 - 1.00 mg/dL Final         Passed - Valid encounter within last 12 months    Recent Outpatient Visits           1 month ago Acquired hypothyroidism   [place] [facility] [place] - [facility] Dept Of [place]. [facility] Claiborne Rigg, NP   4 months ago Age spots   [place] [facility] [place] - A Dept Of [place]. [facility] [place], Marzella Schlein, [place]   2 years ago Acquired hypothyroidism   [place] [facility] [place] - A Dept Of [place]. [facility] Claiborne Rigg, NP   4 years ago Acquired hypothyroidism   [place] [facility] [place] - A Dept Of [place]. [facility] Claiborne Rigg, NP   5 years ago Acquired hypothyroidism   [place] [facility] [place] - A Dept Of [place]. [facility] [place], [place], [place]              Signed Prescriptions Disp Refills   lamoTRIgine (LAMICTAL) 200 MG tablet 60 tablet 2    Sig: Take 1 tablet (200 mg total) by mouth 2 (two) times daily.     Neurology:  Anticonvulsants - lamotrigine Failed - 06/25/2023  4:31 PM       Failed - AST in normal range and within 360 days    AST  Date Value Ref Range Status  12/24/2022 42 (H) 15 - 41 U/L Final         Passed - Cr in normal range and within 360 days    Creat  Date Value Ref Range Status  05/14/2016 1.02 0.50 - 1.05 mg/dL Final    Comment:      For patients > or = 64 years of age: The upper reference limit for Creatinine is approximately 13% higher for people identified as African-American.      Creatinine, Ser  Date Value Ref Range Status  02/05/2023 0.79 0.57 - 1.00 mg/dL Final         Passed - ALT in normal range and within 360 days    ALT  Date Value Ref Range Status  12/24/2022 23 0 - 44 U/L Final         Passed - Completed PHQ-2 or PHQ-9 in the last 360 days      Passed - Valid encounter within last 12 months  Recent Outpatient Visits           1 month ago Acquired hypothyroidism   [place] [facility] [place] - [facility] Dept Of [place]. [facility] Claiborne Rigg, NP   4 months ago Age spots   [place] [facility] [place] - A Dept Of [place]. [facility] [place], Marzella Schlein, [place]   2 years ago Acquired hypothyroidism   [place] [facility] [place] - A Dept Of [place]. [facility] Claiborne Rigg, NP   4 years ago Acquired hypothyroidism   [place] [facility] [place] - A Dept Of [place]. [facility] Claiborne Rigg, NP   5 years ago Acquired hypothyroidism   [place] [facility] [place] - A Dept Of [place]. [facility] [place], [place], [place]

## 2023-06-25 NOTE — Telephone Encounter (Signed)
 Requested Prescriptions  Pending Prescriptions Disp Refills   hydrOXYzine (ATARAX) 50 MG tablet 60 tablet 2    Sig: Take 1 tablet (50 mg total) by mouth 2 (two) times daily.     Ear, Nose, and Throat:  Antihistamines 2 Passed - 06/25/2023  4:30 PM      Passed - Cr in normal range and within 360 days    Creat  Date Value Ref Range Status  05/14/2016 1.02 0.50 - 1.05 mg/dL Final    Comment:      For patients > or = 64 years of age: The upper reference limit for Creatinine is approximately 13% higher for people identified as African-American.      Creatinine, Ser  Date Value Ref Range Status  02/05/2023 0.79 0.57 - 1.00 mg/dL Final         Passed - Valid encounter within last 12 months    Recent Outpatient Visits           1 month ago Acquired hypothyroidism   [place] [facility] [place] - A Dept Of [place]. [facility] Claiborne Rigg, NP   4 months ago Age spots   [place] [facility] [place] - A Dept Of [place]. [facility] [place], Marzella Schlein, [place]   2 years ago Acquired hypothyroidism   [place] [facility] [place] - A Dept Of [place]. [facility] Claiborne Rigg, NP   4 years ago Acquired hypothyroidism   [place] [facility] [place] - A Dept Of [place]. [facility] Claiborne Rigg, NP   5 years ago Acquired hypothyroidism   [place] [facility] [place] - A Dept Of [place]. [facility], Marylene [place], [place]               lamoTRIgine (LAMICTAL) 200 MG tablet 60 tablet 3    Sig: Take 1 tablet (200 mg total) by mouth 2 (two) times daily.     Neurology:  Anticonvulsants - lamotrigine Failed - 06/25/2023  4:30 PM      Failed - AST in normal range and within 360 days    AST  Date Value Ref Range Status  12/24/2022 42 (H) 15 - 41 U/L Final         Passed - Cr in normal range and within 360 days    Creat  Date Value Ref Range Status  05/14/2016 1.02 0.50 - 1.05 mg/dL Final    Comment:       For patients > or = 64 years of age: The upper reference limit for Creatinine is approximately 13% higher for people identified as African-American.      Creatinine, Ser  Date Value Ref Range Status  02/05/2023 0.79 0.57 - 1.00 mg/dL Final         Passed - ALT in normal range and within 360 days    ALT  Date Value Ref Range Status  12/24/2022 23 0 - 44 U/L Final         Passed - Completed PHQ-2 or PHQ-9 in the last 360 days      Passed - Valid encounter within last 12 months    Recent Outpatient Visits           1 month ago Acquired hypothyroidism   [place] [facility] [place] [facility] - A Dept Of [place]. [facility] Claiborne Rigg, NP   4 months ago Age spots   [place]  [facility] [place] - A Dept Of [place]. [facility] [place], Marzella Schlein, [place]   2 years ago Acquired hypothyroidism   [place] [facility] [place] - A Dept Of [place]. [facility] Claiborne Rigg, NP   4 years ago Acquired hypothyroidism   [place] [facility] [place] - A Dept Of [place]. [facility] Claiborne Rigg, NP   5 years ago Acquired hypothyroidism   [place] [facility] [place] - A Dept Of [place]. [facility] [place], [place], [place]

## 2023-06-26 ENCOUNTER — Other Ambulatory Visit (HOSPITAL_BASED_OUTPATIENT_CLINIC_OR_DEPARTMENT_OTHER): Payer: Self-pay

## 2023-06-26 ENCOUNTER — Ambulatory Visit: Admitting: Family Medicine

## 2023-06-26 ENCOUNTER — Other Ambulatory Visit (HOSPITAL_COMMUNITY)
Admission: RE | Admit: 2023-06-26 | Discharge: 2023-06-26 | Disposition: A | Discharge status: Home / Self Care | Source: Ambulatory Visit | Attending: Family Medicine | Admitting: Family Medicine

## 2023-06-26 ENCOUNTER — Ambulatory Visit: Payer: Self-pay

## 2023-06-26 VITALS — BP 102/66 | HR 61 | Temp 97.9°F | Resp 16 | Ht 66.0 in | Wt 132.4 lb

## 2023-06-26 DIAGNOSIS — R8281 Pyuria: Secondary | ICD-10-CM

## 2023-06-26 DIAGNOSIS — R7989 Other specified abnormal findings of blood chemistry: Secondary | ICD-10-CM

## 2023-06-26 DIAGNOSIS — N898 Other specified noninflammatory disorders of vagina: Secondary | ICD-10-CM | POA: Diagnosis not present

## 2023-06-26 DIAGNOSIS — E079 Disorder of thyroid, unspecified: Secondary | ICD-10-CM | POA: Diagnosis not present

## 2023-06-26 LAB — POCT URINALYSIS DIP (MANUAL ENTRY)
Bilirubin, UA: NEGATIVE
Blood, UA: NEGATIVE
Glucose, UA: NEGATIVE mg/dL
Ketones, POC UA: NEGATIVE mg/dL
Nitrite, UA: NEGATIVE
Protein Ur, POC: NEGATIVE mg/dL
Spec Grav, UA: <=1.005 — AB (ref 1.010–1.025)
Urobilinogen, UA: 0.2 U/dL
pH, UA: 5 (ref 5.0–8.0)

## 2023-06-26 LAB — SURGICAL PATHOLOGY

## 2023-06-26 MED ORDER — CEPHALEXIN 500 MG PO CAPS: 500.0000 mg | ORAL_CAPSULE | Freq: Two times a day (BID) | ORAL | 0 refills | Status: DC

## 2023-06-26 MED ORDER — FLUCONAZOLE 150 MG PO TABS: 150.0000 mg | ORAL_TABLET | Freq: Every day | ORAL | 0 refills | Status: DC

## 2023-06-26 MED ORDER — FLUCONAZOLE 150 MG PO TABS
150.0000 mg | ORAL_TABLET | Freq: Every day | ORAL | 0 refills | Status: DC
  Filled 2023-06-26: qty 2, 2d supply, fill #0

## 2023-06-26 MED ORDER — CEPHALEXIN 500 MG PO CAPS
500.0000 mg | ORAL_CAPSULE | Freq: Two times a day (BID) | ORAL | 0 refills | Status: DC
  Filled 2023-06-26: qty 20, 10d supply, fill #0

## 2023-06-26 NOTE — Patient Instructions (Signed)

## 2023-06-26 NOTE — Progress Notes (Signed)
 Established Patient Office Visit  Subjective   Patient ID: Rachel Everett, female    DOB: [birth year]  Age: 63 y.o. MRN: [id number]  Chief Complaint  Patient presents with   Vaginal Discharge   cloudy urine    Bladder feels like its pinching    blood work    HPI Discussed the use of AI scribe software for clinical note transcription with the patient, who gave verbal consent to proceed.  History of Present Illness Rachel Everett is a 63 year old female with a history of myxedema coma who presents with symptoms suggestive of a urinary tract infection. She was referred by [facility] for evaluation due to unavailability of her regular provider.  She experiences a 'pinching' sensation in her bladder, which she associates with a urinary tract infection (UTI). 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 She has been treated for UTIs since her hospitalization, which lasted about 14 days, primarily due to thyroid issues. Her mother had similar problems and was on medication to prevent UTIs. She has not seen a urologist but underwent extensive testing during her hospital stay. No itching or burning is reported.  Regarding yeast infections, she notes a coating on the toilet water after urination, which she associates with yeast infections. She recalls being prescribed Diflucan and another medication in February for her yeast infections. She is unsure of the exact medications but remembers one was rapid-acting and the other was taken every three days.  She is under the care of an endocrinologist who has advised her to have her thyroid levels checked three weeks after her last visit, which aligns  with the current timeline. She is awaiting lab work to ensure her thyroid is stable.   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   Bipolar disorder, current episode mixed, moderate (HCC) 08/28/2017   Mixed hyperlipidemia 11/24/2016   Diabetes mellitus screening 05/14/2016   Vitamin D deficiency 05/14/2016   Hypothyroidism 09/11/2015   Thyroid disease 09/11/2014   Past Medical History:  Diagnosis Date   Anxiety    Bipolar 1 disorder (HCC)    Depression    GERD (gastroesophageal reflux disease)    Hyperlipidemia    SVD (spontaneous vaginal delivery)    x 1   Thyroid disease    Past Surgical History:  Procedure Laterality Date  ANKLE SURGERY     right    ANTERIOR CRUCIATE LIGAMENT REPAIR Left    BACK SURGERY     L5-S1   CHOLECYSTECTOMY     FRACTURE SURGERY Left    elbow   TENDON REPAIR Right    angle   TONSILLECTOMY     Social History   Tobacco Use   Smoking status: Former    Current packs/day: 0.00    Average packs/day: 0.5 packs/day for 1 year (0.5 ttl pk-yrs)    Types: Cigarettes    Start date: 10/04/2014    Quit date: 10/04/2015    Years since quitting: 7.7   Smokeless tobacco: Never  Vaping Use   Vaping status: Former  Substance Use Topics   Alcohol use: Yes    Alcohol/week: 0.0 standard drinks of alcohol    Comment: rare   Drug use: No   Social History   Socioeconomic History   Marital status: Single    Spouse name: Not on file   Number of children: Not on file   Years of education: Not on file   Highest education level: Bachelor's degree (e.g., BA, AB, BS)  Occupational History   Not on file  Tobacco Use   Smoking status: Former    Current packs/day: 0.00    Average packs/day: 0.5 packs/day for 1 year (0.5 ttl pk-yrs)    Types: Cigarettes    Start date: 10/04/2014    Quit date: 10/04/2015    Years since quitting: 7.7   Smokeless tobacco: Never  Vaping Use   Vaping status: Former  Substance and  Sexual Activity   Alcohol use: Yes    Alcohol/week: 0.0 standard drinks of alcohol    Comment: rare   Drug use: No   Sexual activity: Yes    Birth control/protection: Post-menopausal  Other Topics Concern   Not on file  Social History Narrative   Not on file   Social Drivers of Health   Financial Resource Strain: High Risk (06/26/2023)   Overall Financial Resource Strain (CARDIA)    Difficulty of Paying Living Expenses: Very hard  Food Insecurity: Food Insecurity Present (06/26/2023)   Hunger Vital Sign    Worried About Running Out of Food in the Last Year: Often true    Ran Out of Food in the Last Year: Sometimes true  Transportation Needs: Unmet Transportation Needs (06/26/2023)   PRAPARE - Transportation    Lack of Transportation (Medical): Yes    Lack of Transportation (Non-Medical): Yes  Physical Activity: Inactive (06/26/2023)   Exercise Vital Sign    Days of Exercise per Week: 0 days    Minutes of Exercise per Session: 0 min  Stress: Stress Concern Present (06/26/2023)   [employer] of Occupational Health - Occupational Stress Questionnaire    Feeling of Stress : Rather much  Social Connections: Socially Isolated (06/26/2023)   Social Connection and Isolation Panel [NHANES]    Frequency of Communication with Friends and Family: Never    Frequency of Social Gatherings with Friends and Family: Never    Attends Religious Services: Never    Database administrator or Organizations: No    Attends Banker Meetings: Never    Marital Status: Never married  Intimate Partner Violence: Not At Risk (12/24/2022)   Humiliation, Afraid, Rape, and Kick questionnaire    Fear of Current or Ex-Partner: No    Emotionally Abused: No    Physically Abused: No    Sexually Abused: No   Family  Status  Relation Name Status   Mother  Alive   MGM  Deceased   Neg Hx  (Not Specified)  No partnership data on file   Family History  Problem Relation Age of Onset   Breast  cancer Mother 72 - 17       recur 21s   Cancer Mother    Breast cancer Maternal Grandmother 26 - 62   Cancer Maternal Grandmother    Colon cancer Neg Hx    Esophageal cancer Neg Hx    Rectal cancer Neg Hx    Stomach cancer Neg Hx    BRCA 1/2 Neg Hx    No Known Allergies    Review of Systems  Constitutional:  Negative for fever.  HENT:  Negative for congestion.   Eyes:  Negative for blurred vision.  Respiratory:  Negative for cough.   Cardiovascular:  Negative for chest pain and palpitations.  Gastrointestinal:  Negative for vomiting.  Genitourinary:  Positive for dysuria and frequency.       Vaginal itching / discharge   Musculoskeletal:  Negative for back pain.  Skin:  Negative for rash.  Neurological:  Negative for loss of consciousness and headaches.      Objective:     BP 102/66 (BP Location: Right Arm, Patient Position: Sitting, Cuff Size: Normal)   Pulse 61   Temp 97.9 F (36.6 C) (Oral)   Resp 16   Ht 5\' 6"  (1.676 m)   Wt 132 lb 6.4 oz (60.1 kg)   SpO2 99%   BMI 21.37 kg/m  BP Readings from Last 3 Encounters:  06/26/23 102/66  06/03/23 120/80  05/13/23 103/68   Wt Readings from Last 3 Encounters:  06/26/23 132 lb 6.4 oz (60.1 kg)  06/03/23 126 lb (57.2 kg)  05/13/23 129 lb 12.8 oz (58.9 kg)   SpO2 Readings from Last 3 Encounters:  06/26/23 99%  06/03/23 99%  05/13/23 96%      Physical Exam   Results for orders placed or performed in visit on 06/26/23  Urine Culture   Specimen: Urine  Result Value Ref Range   MICRO NUMBER: [id number]    SPECIMEN QUALITY: Adequate    Sample Source URINE    STATUS: FINAL    Result: No Growth   Thyroid Panel With TSH  Result Value Ref Range   TSH 0.025 (L) 0.450 - 4.500 uIU/mL   T4, Total 10.3 4.5 - 12.0 ug/dL   T3 Uptake Ratio 28 24 - 39 %   Free Thyroxine Index 2.9 1.2 - 4.9  CBC with Differential  Result Value Ref Range   WBC 6.7 3.4 - 10.8 x10E3/uL   RBC 4.25 3.77 - 5.28 x10E6/uL   Hemoglobin 12.3  11.1 - 15.9 g/dL   Hematocrit 13.0 86.5 - 46.6 %   MCV 92 79 - 97 fL   MCH 28.9 26.6 - 33.0 pg   MCHC 31.5 31.5 - 35.7 g/dL   RDW 78.4 69.6 - 29.5 %   Platelets 302 150 - 450 x10E3/uL   Neutrophils 47 Not Estab. %   Lymphs 46 Not Estab. %   Monocytes 6 Not Estab. %   Eos 1 Not Estab. %   Basos 0 Not Estab. %   Neutrophils Absolute 3.1 1.4 - 7.0 x10E3/uL   Lymphocytes Absolute 3.1 0.7 - 3.1 x10E3/uL   Monocytes Absolute 0.4 0.1 - 0.9 x10E3/uL   EOS (ABSOLUTE) 0.1 0.0 - 0.4 x10E3/uL   Basophils Absolute 0.0 0.0 - 0.2 x10E3/uL  Immature Granulocytes 0 Not Estab. %   Immature Grans (Abs) 0.0 0.0 - 0.1 x10E3/uL  POCT urinalysis dipstick  Result Value Ref Range   Color, UA yellow yellow   Clarity, UA cloudy (A) clear   Glucose, UA negative negative mg/dL   Bilirubin, UA negative negative   Ketones, POC UA negative negative mg/dL   Spec Grav, UA <=1.610 (A) 1.010 - 1.025   Blood, UA negative negative   pH, UA 5.0 5.0 - 8.0   Protein Ur, POC negative negative mg/dL   Urobilinogen, UA 0.2 0.2 or 1.0 E.U./dL   Nitrite, UA Negative Negative   Leukocytes, UA Trace (A) Negative    Last CBC Lab Results  Component Value Date   WBC 6.7 06/26/2023   HGB 12.3 06/26/2023   HCT 39.0 06/26/2023   MCV 92 06/26/2023   MCH 28.9 06/26/2023   RDW 13.9 06/26/2023   PLT 302 06/26/2023   Last metabolic panel Lab Results  Component Value Date   GLUCOSE 102 (H) 02/05/2023   NA 147 (H) 02/05/2023   K 3.8 02/05/2023   CL 107 (H) 02/05/2023   CO2 23 02/05/2023   BUN 9 02/05/2023   CREATININE 0.79 02/05/2023   EGFR 84 02/05/2023   CALCIUM 10.0 02/05/2023   PHOS 2.9 12/24/2022   PROT 6.1 (L) 12/24/2022   ALBUMIN 3.6 12/24/2022   LABGLOB 2.3 01/21/2021   AGRATIO 2.0 01/21/2021   BILITOT 0.7 12/24/2022   ALKPHOS 82 12/24/2022   AST 42 (H) 12/24/2022   ALT 23 12/24/2022   ANIONGAP 12 12/27/2022   Last lipids Lab Results  Component Value Date   CHOL 162 05/13/2023   HDL 56  05/13/2023   LDLCALC 86 05/13/2023   TRIG 113 05/13/2023   CHOLHDL 2.9 05/13/2023   Last hemoglobin A1c Lab Results  Component Value Date   HGBA1C 5.0 05/14/2016   Last thyroid functions Lab Results  Component Value Date   TSH 0.025 (L) 06/26/2023   T4TOTAL 10.3 06/26/2023   Last vitamin D Lab Results  Component Value Date   VD25OH 41.3 02/05/2023   Last vitamin B12 and Folate No results found for: "VITAMINB12", "FOLATE"    The ASCVD Risk score (Arnett DK, et al., 2019) failed to calculate for the following reasons:   Unable to determine if patient is Non-Hispanic African American    Assessment & Plan:   Problem List Items Addressed This Visit       Unprioritized   Thyroid disease   Other Visit Diagnoses       Vaginal discharge    -  Primary   Relevant Medications   cephALEXin (KEFLEX) 500 MG capsule   fluconazole (DIFLUCAN) 150 MG tablet   Other Relevant Orders   POCT urinalysis dipstick (Completed)   Cervicovaginal ancillary only( [place])     Pyuria       Relevant Medications   cephALEXin (KEFLEX) 500 MG capsule   Other Relevant Orders   Urine Culture (Completed)     Abnormal CBC         Assessment and Plan Assessment & Plan Urinary Tract Infection (UTI)   She presents with possible early-stage UTI, experiencing bladder discomfort and a history of UTIs, which have previously triggered a myxedema coma. A urine dip confirms early UTI, necessitating immediate treatment to prevent weekend progression. Send urine culture, prescribe an antibiotic, and advise follow-up with her primary care provider for ongoing management.  Yeast Infection   Symptoms indicate a yeast infection,  with a history of recurrence often leading to UTIs. Immediate treatment is required to prevent progression. Prescribe Diflucan.  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 She remains under  endocrinologist care for thyroid management.  Thyroid Function Monitoring   The endocrinologist recommended repeating thyroid function tests (TSH) three weeks after the last visit. Coordinate with her primary care provider for timely testing. Perform the thyroid function test using the existing order and advise follow-up with the endocrinologist for thyroid management.    Return in about 2 weeks (around 07/10/2023), or if symptoms worsen or fail to improve, for with her regular pcp.    Rachel Everett R Lowne Chase, DO

## 2023-06-26 NOTE — Telephone Encounter (Signed)
 Chief Complaint: Vaginal discharge Symptoms: odor, lower abd pain 4/10, low grade temp 100.8 Frequency: Onset a couple days ago Pertinent Negatives: Patient denies other symptoms, no vaginal itching Disposition: [] ED /[] Urgent Care (no appt availability in office) / [x] Appointment(In office/virtual)/ []  [facility]/ [] Home Care/ [] Refused Recommended Disposition /[] [facility]/ []  Follow-up with PCP Additional Notes: Patient says she thinks she has a bladder infection starting as well as she knows she has a yeast infection, because this has happened in the past and she got really sick. Advised no availability with PCP or any provider at [facility], she agreed to see any provider at another location, scheduled today with Dr. Zola Button.   Copied from CRM [id number]. Topic: Clinical - Red Word Triage >> Jun 26, 2023  9:13 AM Pascal Lux wrote: Red Word that prompted transfer to Nurse Triage: Patient stated she has a yeast infection and had a one in the past where she ended up in a coma. Reason for Disposition  [1] Mild lower abdominal pain comes and goes (cramps) AND [2] lasts > 24 hours  Answer Assessment - Initial Assessment Questions 1. DISCHARGE: "Describe the discharge." (e.g., white, yellow, green, gray, foamy, cottage cheese-like)     Not sure, not seen until urinate 2. ODOR: "Is there a bad odor?"     Yes 3. ONSET: "When did the discharge begin?"     A couple of days 4. RASH: "Is there a rash in the genital area?" If Yes, ask: "Describe it." (e.g., redness, blisters, sores, bumps)     No 5. ABDOMEN PAIN: "Are you having any abdomen pain?" If Yes, ask: "What does it feel like? " (e.g., crampy, dull, intermittent, constant)      Yes 6. ABDOMEN PAIN SEVERITY: If present, ask: "How bad is it?" (e.g., Scale 1-10; mild, moderate, or severe)   - MILD (1-3): Doesn't interfere with normal activities, abdomen soft and not tender to touch.    - MODERATE (4-7): Interferes with normal  activities or awakens from sleep, abdomen tender to touch.    - SEVERE (8-10): Excruciating pain, doubled over, unable to do any normal activities. (R/O peritonitis)      4 7. CAUSE: "What do you think is causing the discharge?" "Have you had the same problem before? What happened then?"     Yeast infection and UTI 8. OTHER SYMPTOMS: "Do you have any other symptoms?" (e.g., fever, itching, vaginal bleeding, pain with urination, injury to genital area, vaginal foreign body)     Lower abdominal pain, pain with urination, low grade fever 100.8  Protocols used: Vaginal Discharge-A-AH

## 2023-06-26 NOTE — Telephone Encounter (Signed)
 Noted patient has appointment at another office.

## 2023-06-27 LAB — URINE CULTURE
MICRO NUMBER:: 16319512
Result:: NO GROWTH
SPECIMEN QUALITY:: ADEQUATE

## 2023-06-27 MED ORDER — HYDROXYZINE HCL 50 MG PO TABS
50.0000 mg | ORAL_TABLET | Freq: Two times a day (BID) | ORAL | 2 refills | Status: DC
Start: 2023-06-27 — End: 2023-09-26

## 2023-06-28 ENCOUNTER — Encounter: Payer: Self-pay | Admitting: Nurse Practitioner

## 2023-06-28 ENCOUNTER — Other Ambulatory Visit: Payer: Self-pay | Admitting: Nurse Practitioner

## 2023-06-28 DIAGNOSIS — E039 Hypothyroidism, unspecified: Secondary | ICD-10-CM

## 2023-06-28 LAB — THYROID PANEL WITH TSH
Free Thyroxine Index: 2.9 (ref 1.2–4.9)
T3 Uptake Ratio: 28 % (ref 24–39)
T4, Total: 10.3 ug/dL (ref 4.5–12.0)
TSH: 0.025 u[IU]/mL — ABNORMAL LOW (ref 0.450–4.500)

## 2023-06-28 LAB — CBC WITH DIFFERENTIAL/PLATELET
Basophils Absolute: 0 10*3/uL (ref 0.0–0.2)
Basos: 0 %
EOS (ABSOLUTE): 0.1 10*3/uL (ref 0.0–0.4)
Eos: 1 %
Hematocrit: 39 % (ref 34.0–46.6)
Hemoglobin: 12.3 g/dL (ref 11.1–15.9)
Immature Grans (Abs): 0 10*3/uL (ref 0.0–0.1)
Immature Granulocytes: 0 %
Lymphocytes Absolute: 3.1 10*3/uL (ref 0.7–3.1)
Lymphs: 46 %
MCH: 28.9 pg (ref 26.6–33.0)
MCHC: 31.5 g/dL (ref 31.5–35.7)
MCV: 92 fL (ref 79–97)
Monocytes Absolute: 0.4 10*3/uL (ref 0.1–0.9)
Monocytes: 6 %
Neutrophils Absolute: 3.1 10*3/uL (ref 1.4–7.0)
Neutrophils: 47 %
Platelets: 302 10*3/uL (ref 150–450)
RBC: 4.25 x10E6/uL (ref 3.77–5.28)
RDW: 13.9 % (ref 11.7–15.4)
WBC: 6.7 10*3/uL (ref 3.4–10.8)

## 2023-06-28 MED ORDER — LEVOTHYROXINE SODIUM 88 MCG PO TABS
88.0000 ug | ORAL_TABLET | Freq: Every day | ORAL | 1 refills | Status: DC
Start: 2023-06-28 — End: 2024-02-11

## 2023-06-29 ENCOUNTER — Encounter: Payer: Self-pay | Admitting: *Deleted

## 2023-06-29 DIAGNOSIS — H524 Presbyopia: Secondary | ICD-10-CM | POA: Diagnosis not present

## 2023-06-29 DIAGNOSIS — H2513 Age-related nuclear cataract, bilateral: Secondary | ICD-10-CM | POA: Diagnosis not present

## 2023-06-29 DIAGNOSIS — H18513 Endothelial corneal dystrophy, bilateral: Secondary | ICD-10-CM | POA: Diagnosis not present

## 2023-06-29 DIAGNOSIS — H43393 Other vitreous opacities, bilateral: Secondary | ICD-10-CM | POA: Diagnosis not present

## 2023-07-01 ENCOUNTER — Other Ambulatory Visit (HOSPITAL_BASED_OUTPATIENT_CLINIC_OR_DEPARTMENT_OTHER): Payer: Self-pay

## 2023-07-01 LAB — CERVICOVAGINAL ANCILLARY ONLY
Bacterial Vaginitis (gardnerella): NEGATIVE
Candida Glabrata: NEGATIVE
Candida Vaginitis: NEGATIVE
Comment: NEGATIVE
Comment: NEGATIVE
Comment: NEGATIVE

## 2023-07-03 ENCOUNTER — Encounter: Payer: Self-pay | Admitting: Family Medicine

## 2023-07-21 ENCOUNTER — Other Ambulatory Visit (HOSPITAL_BASED_OUTPATIENT_CLINIC_OR_DEPARTMENT_OTHER): Payer: Self-pay

## 2023-08-06 ENCOUNTER — Telehealth: Payer: Self-pay | Admitting: Nurse Practitioner

## 2023-08-06 NOTE — Telephone Encounter (Signed)
 Can the lab orders be put in for her, if not let me know and I can schedule an appointment for her.   Copied from CRM [id number]. Topic: Clinical - Request for Lab/Test Order >> Aug 06, 2023  2:31 PM Rachel Everett wrote:  Reason for CRM: Patient would like a lab order to check her thyroid . She stated that she has to get that done every 6 weeks. Once completed, please call back patient.  >> Aug 06, 2023  2:34 PM CMA Rocky Cipro C wrote: Wrong office

## 2023-08-11 NOTE — Telephone Encounter (Signed)
 Unable to reach patient by phone.  Unable leave voicemail.

## 2023-08-20 ENCOUNTER — Ambulatory Visit: Payer: Self-pay

## 2023-08-20 NOTE — Telephone Encounter (Signed)
 Patient has appointment on 08/31/23 at 4:10

## 2023-08-20 NOTE — Telephone Encounter (Signed)
 FYI Only or Action Required?: Action required by provider  Patient was last seen in primary care on 06/26/2023 by Rachel Everett, Candida Chalk, DO. Called Nurse Triage reporting Nausea, Dizziness, Excessive Sweating, and Weakness. Symptoms began a week ago. Interventions attempted: Prescription medications: synthroid  recent dose change to 88 mcg. Symptoms are: rapidly worsening.  Triage Disposition: Call EMS 911 Now  Patient/caregiver understands and will follow disposition?: No, refuses disposition - Pt stating "not urgent, have some time" since had these symptoms for 6 months before coma last time, requesting appt and close follow-up with labs.        Copied from CRM [id number]. Topic: General - Other >> Aug 20, 2023  2:12 PM Howard Macho wrote: Reason for CRM: patient called stating she saw an endocrinology and they did labs on her and based off of their recommendations she saw MD Rachel Everett and the patient was suppose to get labs done every six weeks to check her thyroid . The patient states she is having symptoms or nausea and off balance because she had a coma in the past and her symptoms are coming back  CB [phone] Reason for Disposition  Sounds like a life-threatening emergency to the triager  Answer Assessment - Initial Assessment Questions 1. DESCRIPTION: "Describe your dizziness."     Don't walk straight, I'm just leaning intermittently when I'm walking, catch the wall to keep from falling 2. LIGHTHEADED: "Do you feel lightheaded?" (e.g., somewhat faint, woozy, weak upon standing)     A little bit woozy or weak with standing 3. VERTIGO: "Do you feel like either you or the room is spinning or tilting?" (i.e. vertigo)     no 4. SEVERITY: "How bad is it?"  "Do you feel like you are going to faint?" "Can you stand and walk?"   - MILD: Feels slightly dizzy, but walking normally.   - MODERATE: Feels unsteady when walking, but not falling; interferes with normal activities (e.g., school,  work).   - SEVERE: Unable to walk without falling, or requires assistance to walk without falling; feels like passing out now.      Able to walk without assistance but affecting walk, have had daughter help me up and down the stairs 5. ONSET:  "When did the dizziness begin?"     Within the past week 6. AGGRAVATING FACTORS: "Does anything make it worse?" (e.g., standing, change in head position)     Nausea is worse when get up in the morning 7. HEART RATE: "Can you tell me your heart rate?" "How many beats in 15 seconds?"  (Note: not all patients can do this)       States heart beat's normal 9. RECURRENT SYMPTOM: "Have you had dizziness before?" If Yes, ask: "When was the last time?" "What happened that time?"     Had these symptoms for a good 6 months that preceded a coma, not urgent but want to keep checking levels 10. OTHER SYMPTOMS: "Do you have any other symptoms?" (e.g., fever, chest pain, vomiting, diarrhea, bleeding)       Nausea, Been eating and drinking okay, no chest pain or SOB, did throw up x1 a few days ago, no fever, nausea past few days more consistent through morning, nausea and dizziness intermittent, feel clammy episodes get into a sweat, running hot and cold I guess Recent dosage to synthroid  88 mcg, need to make sure working for me and do labs  Advised call 911 or get to hospital asap if any worsening  or new symptoms  Protocols used: Dizziness - Lightheadedness-A-AH

## 2023-08-26 ENCOUNTER — Other Ambulatory Visit (HOSPITAL_BASED_OUTPATIENT_CLINIC_OR_DEPARTMENT_OTHER): Admitting: Pharmacist

## 2023-08-26 ENCOUNTER — Encounter: Payer: Self-pay | Admitting: Pharmacist

## 2023-08-26 DIAGNOSIS — E785 Hyperlipidemia, unspecified: Secondary | ICD-10-CM

## 2023-08-26 MED ORDER — ATORVASTATIN CALCIUM 20 MG PO TABS: 20.0000 mg | ORAL_TABLET | Freq: Every day | ORAL | 1 refills | Status: DC

## 2023-08-26 NOTE — Progress Notes (Signed)
 Pharmacy Quality Measure Review  This patient is appearing on a report for being at risk of failing the adherence measure for cholesterol (statin) medications this calendar year.   Medication: atorvastatin  Last fill date: 07/27/2023 for 30 day supply  Insurance report was not up to date. No action needed at this time.  and Contacted pharmacy to facilitate refills. Of note, Lonna Roam will fill 90-day supplies if the patient is okay with this. Atorvastatin  rxn resent for a 90-day supply.   Marene Shape, PharmD, Becky Bowels, CPP Clinical Pharmacist [facility] & [facility] [phone]

## 2023-08-31 ENCOUNTER — Ambulatory Visit: Admitting: Nurse Practitioner

## 2023-09-02 ENCOUNTER — Other Ambulatory Visit: Payer: Self-pay | Admitting: Pharmacist

## 2023-09-02 NOTE — Progress Notes (Signed)
 Pharmacy Quality Measure Review  This patient is appearing on a report for being at risk of failing the adherence measure for cholesterol (statin) medications this calendar year.   Medication: atorvastatin  Last fill date: 08/26/2023 for 90 day supply  Insurance report was not up to date. No action needed at this time.  and Contacted pharmacy to facilitate refills. Of note, we sent atorvastatin  for 90 days last week. Called and confirmed patient picked this up 08/26/2023 for a 90-day. Reminder set for next fill.   Marene Shape, PharmD, Becky Bowels, CPP Clinical Pharmacist [facility] & [facility] [phone]

## 2023-09-03 ENCOUNTER — Telehealth: Payer: Self-pay | Admitting: Nurse Practitioner

## 2023-09-03 NOTE — Telephone Encounter (Signed)
 Contacted pt confirm appt!

## 2023-09-07 ENCOUNTER — Ambulatory Visit: Admitting: Family Medicine

## 2023-09-07 NOTE — Telephone Encounter (Signed)
 Copied from CRM [id number]. Topic: Appointments - Appointment Cancel/Reschedule >> Sep 07, 2023  9:41 AM Donna BRAVO wrote:  Patient/patient representative is calling to cancel or reschedule an appointment. Refer to attachments for appointment information.  Patient calling to cancel appt on 09/07/23 patient will call back to reschedule  Patient has an emergency

## 2023-09-07 NOTE — Telephone Encounter (Signed)
 Noted
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
## 2023-09-26 ENCOUNTER — Ambulatory Visit (HOSPITAL_COMMUNITY): Payer: Self-pay | Admitting: Psychiatry

## 2023-09-26 ENCOUNTER — Encounter (HOSPITAL_COMMUNITY): Payer: Self-pay | Admitting: Psychiatry

## 2023-09-26 DIAGNOSIS — F1210 Cannabis abuse, uncomplicated: Secondary | ICD-10-CM | POA: Diagnosis not present

## 2023-09-26 DIAGNOSIS — F4310 Post-traumatic stress disorder, unspecified: Secondary | ICD-10-CM

## 2023-09-26 DIAGNOSIS — F3162 Bipolar disorder, current episode mixed, moderate: Secondary | ICD-10-CM

## 2023-09-26 MED ORDER — HYDROXYZINE HCL 50 MG PO TABS: 50.0000 mg | ORAL_TABLET | Freq: Two times a day (BID) | ORAL | 2 refills | Status: DC

## 2023-09-26 MED ORDER — QUETIAPINE FUMARATE 100 MG PO TABS: 100.0000 mg | ORAL_TABLET | Freq: Every day | ORAL | 2 refills | Status: DC

## 2023-09-26 MED ORDER — LAMOTRIGINE 200 MG PO TABS: 200.0000 mg | ORAL_TABLET | Freq: Two times a day (BID) | ORAL | 2 refills | Status: DC

## 2023-10-05 ENCOUNTER — Telehealth: Payer: Self-pay | Admitting: Nurse Practitioner

## 2023-10-05 NOTE — Telephone Encounter (Signed)
Contacted pt confirmed appt

## 2023-10-06 ENCOUNTER — Ambulatory Visit: Admitting: Nurse Practitioner

## 2023-10-09 ENCOUNTER — Telehealth: Payer: Self-pay | Admitting: Nurse Practitioner

## 2023-10-09 NOTE — Telephone Encounter (Signed)
 Copied from CRM #[id number]. Topic: Clinical - Request for Lab/Test Order >> Oct 09, 2023  9:03 AM Avram MATSU wrote:  Reason for CRM: PT Is requesting orders for labs to get her thyroid  checked

## 2023-10-13 NOTE — Telephone Encounter (Signed)
Unable to reach patient by phone to relay results.   Voicemail left to return call.

## 2023-10-20 ENCOUNTER — Telehealth (HOSPITAL_COMMUNITY): Payer: Self-pay

## 2023-10-20 NOTE — Telephone Encounter (Signed)
 Patient os calling to let you know that she can not take the Seroquel , she was unaware when you prescribed it that it was Quetiapine  and she cannot take this as it causes her arms to go numb. Please review and advise, thank you

## 2023-10-21 ENCOUNTER — Other Ambulatory Visit (HOSPITAL_COMMUNITY): Payer: Self-pay

## 2023-10-21 MED ORDER — RISPERIDONE 0.5 MG PO TABS: 0.5000 mg | ORAL_TABLET | Freq: Every day | ORAL | 0 refills | Status: DC

## 2023-10-21 NOTE — Telephone Encounter (Signed)
 I called the patient and she agreed with the medication change. Seroquel  was discontinued and Risperdal  0.5mg  1 po at bedtime was sent to the pharmacy

## 2023-11-06 ENCOUNTER — Telehealth: Payer: Self-pay | Admitting: Nurse Practitioner

## 2023-11-06 NOTE — Telephone Encounter (Signed)
 Pt confirmed appt 8/22

## 2023-11-09 ENCOUNTER — Ambulatory Visit: Admitting: Nurse Practitioner

## 2023-11-09 ENCOUNTER — Telehealth: Payer: Self-pay

## 2023-11-09 NOTE — Telephone Encounter (Signed)
 Copied from CRM #[id number]. Topic: Clinical - Request for Lab/Test Order >> Nov 09, 2023 12:31 PM Zy'onna H wrote: Reason for CRM:  Patient called in stating, she has been authorized for a Walk in for her Thyroid  testing & patient has requested a Urinalysis order to be place she it can also be completed during that walk in visit.   She stated she is suspecting a UTI and would like to complete both tests during the same visit.    PCP/PCP Team Please Advise.

## 2023-11-09 NOTE — Telephone Encounter (Signed)
 Noted

## 2023-11-17 ENCOUNTER — Ambulatory Visit (HOSPITAL_COMMUNITY): Admitting: Psychiatry

## 2023-11-17 ENCOUNTER — Encounter (HOSPITAL_COMMUNITY): Payer: Self-pay
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
## 2023-11-30 ENCOUNTER — Other Ambulatory Visit: Payer: Self-pay | Admitting: Pharmacist

## 2023-11-30 NOTE — Progress Notes (Signed)
 Pharmacy Quality Measure Review  This patient is appearing on a report for being at risk of failing the adherence measure for cholesterol (statin) medications this calendar year.   Medication: atorvastatin  Last fill date: 08/26/2023 for 90 day supply  Contacted pharmacy to facilitate refills. Had to leave a message. Reminder set to check later this week if she was able to get this.   Herlene Fleeta Morris, PharmD, JAQUELINE, CPP Clinical Pharmacist [facility] & [facility] [phone]

## 2023-12-02 ENCOUNTER — Ambulatory Visit: Admitting: Nurse Practitioner

## 2023-12-09 ENCOUNTER — Ambulatory Visit: Admitting: Nurse Practitioner

## 2023-12-15 ENCOUNTER — Ambulatory Visit: Payer: Self-pay | Admitting: *Deleted

## 2023-12-15 NOTE — Telephone Encounter (Signed)
 FYI Only or Action Required?: FYI only for provider.  Patient was last seen in primary care on 06/26/2023 by Antonio Meth, Jamee SAUNDERS, DO.  Called Nurse Triage reporting Knee Injury.  Symptoms began several days ago.  Interventions attempted: Nothing.  Symptoms are: unchanged.  Triage Disposition: See Physician Within 24 Hours  Patient/caregiver understands and will follow disposition?: yes- patient is going to call Ortho [facility] to be seen- no open appointment in office.  Reason for Disposition  [1] MODERATE pain (e.g., interferes with normal activities, limping) AND [2] high-risk adult (e.g., age > 60 years, osteoporosis, chronic steroid use)  Answer Assessment - Initial Assessment Questions Patient has knee injury and is concerned about the amount of pain she is having. No open appointment at PCP- will go to Ortho UC for evaluation  1. MECHANISM: How did the injury happen? (e.g., twisting injury, direct blow)      Stepped wrong and twisted the knee- then fell on it several days later 2. ONSET: When did the injury happen? (e.g., minutes, hours ago)      4 days-twisted, 2 days later fell 3. LOCATION: Where is the injury located?      Right knee 4. APPEARANCE of INJURY: What does the injury look like?      swelling 5. SEVERITY: Can you put weight on that leg? Can you walk?      Tenderly walking on it- putting pressure it- getting up/down 6. SIZE: For cuts, bruises, or swelling, ask: How large is it? (e.g., inches or centimeters;  entire joint)      Swelling- right side, small area outer side- mostly pain 7. PAIN: Is there pain? If Yes, ask: How bad is the pain?   What does it keep you from doing? (Scale 0-10; or none, mild, moderate, severe)     7/10 8. TETANUS: For any breaks in the skin, ask: When was your last tetanus booster?     na 9. OTHER SYMPTOMS: Do you have any other symptoms?  (e.g., pop when knee injured, swelling, locking, buckling)       no  Protocols used: Knee Injury-A-AH   Copied from CRM #[id number]. Topic: Clinical - Red Word Triage >> Dec 15, 2023  3:50 PM [place] J wrote: [facility] that prompted transfer to Nurse Triage: Patient is having a lot of right knee pain and swelling. The pain seems to be worsening. She said that the pain started when she twisted it and then 2 days later she fell on it.

## 2023-12-15 NOTE — Telephone Encounter (Signed)
 Noted

## 2023-12-22 ENCOUNTER — Ambulatory Visit: Admitting: Nurse Practitioner

## 2023-12-30 ENCOUNTER — Other Ambulatory Visit: Payer: Self-pay | Admitting: Pharmacist

## 2023-12-30 NOTE — Progress Notes (Signed)
 Pharmacy Quality Measure Review  This patient is appearing on a report for the adherence measure for cholesterol (statin) medications this calendar year.   Medication: atorvastatin  Last fill date: 12/28/2023 for 30 day supply  Reminder set for next month's fill.   Herlene Fleeta Morris, PharmD, JAQUELINE, CPP Clinical Pharmacist [facility] & [facility] [phone]

## 2024-01-20 ENCOUNTER — Other Ambulatory Visit: Payer: Self-pay

## 2024-01-25 ENCOUNTER — Other Ambulatory Visit (HOSPITAL_COMMUNITY): Payer: Self-pay | Admitting: Psychiatry

## 2024-01-25 DIAGNOSIS — F3162 Bipolar disorder, current episode mixed, moderate: Secondary | ICD-10-CM

## 2024-01-26 ENCOUNTER — Other Ambulatory Visit (HOSPITAL_COMMUNITY): Payer: Self-pay | Admitting: Psychiatry

## 2024-01-26 DIAGNOSIS — F3162 Bipolar disorder, current episode mixed, moderate: Secondary | ICD-10-CM

## 2024-02-03 ENCOUNTER — Other Ambulatory Visit: Payer: Self-pay | Admitting: Pharmacist

## 2024-02-03 NOTE — Progress Notes (Signed)
 Pharmacy Quality Measure Review  This patient is appearing on a report for the adherence measure for cholesterol (statin) medications this calendar year.   Medication: atorvastatin  Last fill date: 12/28/2023 for 30 day supply  Call placed to [facility]. Had to leave VM requesting refill. Reminder set to make sure she got this.   Herlene Fleeta Morris, PharmD, JAQUELINE, CPP Clinical Pharmacist [facility] & [facility] [phone]

## 2024-02-04 ENCOUNTER — Other Ambulatory Visit: Payer: Self-pay | Admitting: Nurse Practitioner

## 2024-02-04 DIAGNOSIS — E039 Hypothyroidism, unspecified: Secondary | ICD-10-CM

## 2024-02-08 ENCOUNTER — Other Ambulatory Visit (HOSPITAL_COMMUNITY): Payer: Self-pay | Admitting: Psychiatry

## 2024-02-08 ENCOUNTER — Other Ambulatory Visit: Payer: Self-pay | Admitting: Nurse Practitioner

## 2024-02-08 DIAGNOSIS — F3162 Bipolar disorder, current episode mixed, moderate: Secondary | ICD-10-CM

## 2024-02-08 DIAGNOSIS — E039 Hypothyroidism, unspecified: Secondary | ICD-10-CM

## 2024-02-10 ENCOUNTER — Other Ambulatory Visit: Payer: Self-pay | Admitting: Pharmacist

## 2024-02-10 NOTE — Progress Notes (Signed)
 Pharmacy Quality Measure Review  This patient is appearing on a report for the adherence measure for cholesterol (statin) medications this calendar year.   Medication: atorvastatin  Last fill date: 02/04/2024 for 30 day supply  Fills are up to date. Reminder set for next fill.   Herlene Fleeta Morris, PharmD, JAQUELINE, CPP Clinical Pharmacist [facility] & [facility] [phone]

## 2024-02-11 ENCOUNTER — Inpatient Hospital Stay
Admission: AD | Admit: 2024-02-11 | Discharge: 2024-02-18 | DRG: 885 | Disposition: A | Discharge status: Home / Self Care | Source: Intra-hospital | Attending: Psychiatry | Admitting: Psychiatry

## 2024-02-11 ENCOUNTER — Ambulatory Visit (HOSPITAL_COMMUNITY)
Admission: EM | Admit: 2024-02-11 | Discharge: 2024-02-11 | Disposition: A | Discharge status: Other Acute Inpatient Hospital

## 2024-02-11 ENCOUNTER — Other Ambulatory Visit: Payer: Self-pay

## 2024-02-11 DIAGNOSIS — E785 Hyperlipidemia, unspecified: Secondary | ICD-10-CM | POA: Insufficient documentation

## 2024-02-11 DIAGNOSIS — R45850 Homicidal ideations: Secondary | ICD-10-CM | POA: Diagnosis present

## 2024-02-11 DIAGNOSIS — F315 Bipolar disorder, current episode depressed, severe, with psychotic features: Principal | ICD-10-CM | POA: Diagnosis present

## 2024-02-11 DIAGNOSIS — Z23 Encounter for immunization: Secondary | ICD-10-CM | POA: Diagnosis not present

## 2024-02-11 DIAGNOSIS — R45851 Suicidal ideations: Secondary | ICD-10-CM | POA: Insufficient documentation

## 2024-02-11 DIAGNOSIS — F603 Borderline personality disorder: Secondary | ICD-10-CM | POA: Diagnosis present

## 2024-02-11 DIAGNOSIS — F314 Bipolar disorder, current episode depressed, severe, without psychotic features: Secondary | ICD-10-CM | POA: Diagnosis not present

## 2024-02-11 DIAGNOSIS — E559 Vitamin D deficiency, unspecified: Secondary | ICD-10-CM | POA: Insufficient documentation

## 2024-02-11 DIAGNOSIS — F1290 Cannabis use, unspecified, uncomplicated: Secondary | ICD-10-CM | POA: Diagnosis not present

## 2024-02-11 DIAGNOSIS — E035 Myxedema coma: Secondary | ICD-10-CM | POA: Insufficient documentation

## 2024-02-11 DIAGNOSIS — Z79890 Hormone replacement therapy (postmenopausal): Secondary | ICD-10-CM | POA: Diagnosis not present

## 2024-02-11 DIAGNOSIS — Z5941 Food insecurity: Secondary | ICD-10-CM | POA: Diagnosis not present

## 2024-02-11 DIAGNOSIS — E039 Hypothyroidism, unspecified: Secondary | ICD-10-CM | POA: Insufficient documentation

## 2024-02-11 DIAGNOSIS — F1210 Cannabis abuse, uncomplicated: Secondary | ICD-10-CM | POA: Insufficient documentation

## 2024-02-11 DIAGNOSIS — F411 Generalized anxiety disorder: Secondary | ICD-10-CM | POA: Diagnosis present

## 2024-02-11 LAB — COMPREHENSIVE METABOLIC PANEL WITH GFR
ALT: 10 U/L (ref 0–44)
AST: 13 U/L — ABNORMAL LOW (ref 15–41)
Albumin: 3.4 g/dL — ABNORMAL LOW (ref 3.5–5.0)
Alkaline Phosphatase: 110 U/L (ref 38–126)
Anion gap: 12 (ref 5–15)
BUN: 7 mg/dL — ABNORMAL LOW (ref 8–23)
CO2: 25 mmol/L (ref 22–32)
Calcium: 9.1 mg/dL (ref 8.9–10.3)
Chloride: 105 mmol/L (ref 98–111)
Creatinine, Ser: 0.69 mg/dL (ref 0.44–1.00)
GFR, Estimated: >60 mL/min (ref >60)
Glucose, Bld: 100 mg/dL — ABNORMAL HIGH (ref 70–99)
Potassium: 3.8 mmol/L (ref 3.5–5.1)
Sodium: 142 mmol/L (ref 135–145)
Total Bilirubin: 0.3 mg/dL (ref 0.0–1.2)
Total Protein: 7.1 g/dL (ref 6.5–8.1)

## 2024-02-11 LAB — URINALYSIS, ROUTINE W REFLEX MICROSCOPIC
Bacteria, UA: NONE SEEN
Bilirubin Urine: NEGATIVE
Glucose, UA: NEGATIVE mg/dL
Ketones, ur: NEGATIVE mg/dL
Leukocytes,Ua: NEGATIVE
Nitrite: NEGATIVE
Protein, ur: 30 mg/dL — AB
Specific Gravity, Urine: 1.008 (ref 1.005–1.030)
pH: 5 (ref 5.0–8.0)

## 2024-02-11 LAB — CBC WITH DIFFERENTIAL/PLATELET
Abs Immature Granulocytes: 0.04 K/uL (ref 0.00–0.07)
Basophils Absolute: 0.1 K/uL (ref 0.0–0.1)
Basophils Relative: 1 %
Eosinophils Absolute: 0.2 K/uL (ref 0.0–0.5)
Eosinophils Relative: 2 %
HCT: 37.2 % (ref 36.0–46.0)
Hemoglobin: 11.8 g/dL — ABNORMAL LOW (ref 12.0–15.0)
Immature Granulocytes: 0 %
Lymphocytes Relative: 25 %
Lymphs Abs: 2.5 K/uL (ref 0.7–4.0)
MCH: 29.5 pg (ref 26.0–34.0)
MCHC: 31.7 g/dL (ref 30.0–36.0)
MCV: 93 fL (ref 80.0–100.0)
Monocytes Absolute: 0.7 K/uL (ref 0.1–1.0)
Monocytes Relative: 7 %
Neutro Abs: 6.3 K/uL (ref 1.7–7.7)
Neutrophils Relative %: 65 %
Platelets: 411 K/uL — ABNORMAL HIGH (ref 150–400)
RBC: 4 MIL/uL (ref 3.87–5.11)
RDW: 14.9 % (ref 11.5–15.5)
WBC: 9.9 K/uL (ref 4.0–10.5)
nRBC: 0 % (ref 0.0–0.2)

## 2024-02-11 LAB — LIPID PANEL
Cholesterol: 145 mg/dL (ref 0–200)
HDL: 59 mg/dL (ref >40)
LDL Cholesterol: 70 mg/dL (ref 0–99)
Total CHOL/HDL Ratio: 2.5 ratio
Triglycerides: 78 mg/dL (ref <150)
VLDL: 16 mg/dL (ref 0–40)

## 2024-02-11 LAB — SARS CORONAVIRUS 2 BY RT PCR: SARS Coronavirus 2 by RT PCR: NEGATIVE

## 2024-02-11 LAB — POCT URINE DRUG SCREEN - MANUAL ENTRY (I-SCREEN)
POC Amphetamine UR: NOT DETECTED
POC Buprenorphine (BUP): NOT DETECTED
POC Cocaine UR: NOT DETECTED
POC Marijuana UR: POSITIVE — AB
POC Methadone UR: NOT DETECTED
POC Methamphetamine UR: NOT DETECTED
POC Morphine: NOT DETECTED
POC Oxazepam (BZO): NOT DETECTED
POC Oxycodone UR: NOT DETECTED
POC Secobarbital (BAR): NOT DETECTED

## 2024-02-11 LAB — T4, FREE: Free T4: 1.06 ng/dL (ref 0.61–1.12)

## 2024-02-11 LAB — TSH: TSH: 1.606 u[IU]/mL (ref 0.350–4.500)

## 2024-02-11 LAB — ETHANOL: Alcohol, Ethyl (B): <15 mg/dL (ref <15)

## 2024-02-11 MED ORDER — HYDROXYZINE HCL 25 MG PO TABS
25.0000 mg | ORAL_TABLET | Freq: Three times a day (TID) | ORAL | Status: DC | PRN
  Administered 2024-02-11: 25 mg via ORAL
  Filled 2024-02-11: qty 1

## 2024-02-11 MED ORDER — HALOPERIDOL 5 MG PO TABS: 5.0000 mg | ORAL_TABLET | Freq: Three times a day (TID) | ORAL | Status: DC | PRN

## 2024-02-11 MED ORDER — DIPHENHYDRAMINE HCL 25 MG PO CAPS: 50.0000 mg | ORAL_CAPSULE | Freq: Three times a day (TID) | ORAL | Status: DC | PRN

## 2024-02-11 MED ORDER — ALUM & MAG HYDROXIDE-SIMETH 200-200-20 MG/5ML PO SUSP: 30.0000 mL | ORAL | Status: DC | PRN

## 2024-02-11 MED ORDER — HALOPERIDOL LACTATE 5 MG/ML IJ SOLN: 5.0000 mg | Freq: Three times a day (TID) | INTRAMUSCULAR | Status: DC | PRN

## 2024-02-11 MED ORDER — LEVOTHYROXINE SODIUM 100 MCG PO TABS: 100.0000 ug | ORAL_TABLET | Freq: Every day | ORAL | Status: DC

## 2024-02-11 MED ORDER — DIPHENHYDRAMINE HCL 50 MG/ML IJ SOLN: 50.0000 mg | Freq: Three times a day (TID) | INTRAMUSCULAR | Status: DC | PRN

## 2024-02-11 MED ORDER — INFLUENZA VIRUS VACC SPLIT PF (FLUZONE) 0.5 ML IM SUSY
0.5000 mL | PREFILLED_SYRINGE | INTRAMUSCULAR | Status: AC
  Administered 2024-02-12: 0.5 mL via INTRAMUSCULAR
  Filled 2024-02-11: qty 0.5

## 2024-02-11 MED ORDER — LORAZEPAM 2 MG/ML IJ SOLN: 2.0000 mg | Freq: Three times a day (TID) | INTRAMUSCULAR | Status: DC | PRN

## 2024-02-11 MED ORDER — ATORVASTATIN CALCIUM 10 MG PO TABS
20.0000 mg | ORAL_TABLET | Freq: Every day | ORAL | Status: DC
  Administered 2024-02-12 – 2024-02-18 (×7): 20 mg via ORAL
  Filled 2024-02-11 (×7): qty 2

## 2024-02-11 MED ORDER — ACETAMINOPHEN 325 MG PO TABS
650.0000 mg | ORAL_TABLET | Freq: Four times a day (QID) | ORAL | Status: DC | PRN
  Administered 2024-02-11 – 2024-02-16 (×6): 650 mg via ORAL
  Filled 2024-02-11 (×7): qty 2

## 2024-02-11 MED ORDER — ATORVASTATIN CALCIUM 10 MG PO TABS: 20.0000 mg | ORAL_TABLET | Freq: Every day | ORAL | Status: DC

## 2024-02-11 MED ORDER — HALOPERIDOL LACTATE 5 MG/ML IJ SOLN: 10.0000 mg | Freq: Three times a day (TID) | INTRAMUSCULAR | Status: DC | PRN

## 2024-02-11 MED ORDER — TRAZODONE HCL 50 MG PO TABS
50.0000 mg | ORAL_TABLET | Freq: Every evening | ORAL | Status: DC | PRN
  Administered 2024-02-12 – 2024-02-17 (×6): 50 mg via ORAL
  Filled 2024-02-11 (×6): qty 1

## 2024-02-11 MED ORDER — PNEUMOCOCCAL 20-VAL CONJ VACC 0.5 ML IM SUSY
0.5000 mL | PREFILLED_SYRINGE | INTRAMUSCULAR | Status: AC
  Administered 2024-02-12: 0.5 mL via INTRAMUSCULAR
  Filled 2024-02-11: qty 0.5

## 2024-02-11 MED ORDER — LEVOTHYROXINE SODIUM 100 MCG PO TABS
100.0000 ug | ORAL_TABLET | Freq: Once | ORAL | Status: AC
Start: 2024-02-11 — End: 2024-02-11
  Administered 2024-02-11: 100 ug via ORAL
  Filled 2024-02-11: qty 1

## 2024-02-11 MED ORDER — MAGNESIUM HYDROXIDE 400 MG/5ML PO SUSP: 30.0000 mL | Freq: Every day | ORAL | Status: DC | PRN

## 2024-02-11 MED ORDER — HYDROXYZINE HCL 25 MG PO TABS
25.0000 mg | ORAL_TABLET | Freq: Three times a day (TID) | ORAL | Status: DC | PRN
  Administered 2024-02-11 – 2024-02-12 (×2): 25 mg via ORAL
  Filled 2024-02-11 (×2): qty 1

## 2024-02-11 MED ORDER — DIPHENHYDRAMINE HCL 50 MG PO CAPS: 50.0000 mg | ORAL_CAPSULE | Freq: Three times a day (TID) | ORAL | Status: DC | PRN

## 2024-02-11 MED ORDER — ACETAMINOPHEN 325 MG PO TABS: 650.0000 mg | ORAL_TABLET | Freq: Four times a day (QID) | ORAL | Status: DC | PRN

## 2024-02-11 MED ORDER — TRAZODONE HCL 50 MG PO TABS: 50.0000 mg | ORAL_TABLET | Freq: Every evening | ORAL | Status: DC | PRN

## 2024-02-11 MED ORDER — LEVOTHYROXINE SODIUM 100 MCG PO TABS
100.0000 ug | ORAL_TABLET | Freq: Every day | ORAL | Status: DC
  Administered 2024-02-12 – 2024-02-18 (×7): 100 ug via ORAL
  Filled 2024-02-11 (×7): qty 1

## 2024-02-11 NOTE — Tx Team (Signed)
 Initial Treatment Plan 02/11/2024 5:40 PM Charlett Merkle FMW:[id number]    PATIENT STRESSORS: Health problems   Marital or family conflict     PATIENT STRENGTHS: Ability for insight  Communication skills    PATIENT IDENTIFIED PROBLEMS:   I have no support    I am disabled    I am autistic           DISCHARGE CRITERIA:  Ability to meet basic life and health needs Adequate post-discharge living arrangements Improved stabilization in mood, thinking, and/or behavior Safe-care adequate arrangements made  PRELIMINARY DISCHARGE PLAN: Attend aftercare/continuing care group Outpatient therapy Return to previous living arrangement  PATIENT/FAMILY INVOLVEMENT: This treatment plan has been presented to and reviewed with the patient, Rachel Everett, The patient has been given the opportunity to ask questions and make suggestions.   Rachel CINDERELLA Daring, RN 02/11/2024, 5:40 PM
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
## 2024-02-11 NOTE — ED Notes (Signed)
 Pt transferred to [facility] via safe transport. All belonging and paperwork given to safe transport.

## 2024-02-11 NOTE — Discharge Instructions (Addendum)
 Patient accepted to [facility].

## 2024-02-11 NOTE — ED Notes (Signed)
 Pt sleeping at this time. Rise and fall of chest noted. Pt in NAD at this time. Will continue to monitor.

## 2024-02-11 NOTE — ED Provider Notes (Addendum)
 [facility] Continuous Assessment Admission H&P  Date: 02/11/24 Patient Name: Rachel Everett MRN: [id number] Chief Complaint: Per triage note: PT endorses SI w/ plan; pt stated it was either the pills or call for help. Per pt, if she leaves here today I will end it. PT states she is misdiagnosed w/ bipolar; believes she is autistic and has borderline personality disorder. PT shares that she lives w/ her transgendered alcoholic daughter and 5 dogs; they face eviction. PT vaguely shares that she has past trauma involving her mother and brother; pt endorses HI towards them and admits to thinking about how she would end their lives. PT denies AVH and alcohol use, admits to smoking a lot of weed within the past 24hrs.  Diagnoses:  Final diagnoses:  Bipolar disorder, current episode depressed, severe, without psychotic features (HCC)  Homicidal ideation  Suicidal ideation  Cannabis use disorder  Acquired hypothyroidism    HPI: [name], 63 y.o., female with a history of bipolar disorder, PTSD, and hypothyroidism presented to [facility] via [facility] with complaints of suicidal ideation with a plan to overdose on medications. Patient seen face to face by this provider and chart reviewed on 02/11/24. Assessment was complicated due to patient's presentation as she was tearful and unwilling to fully participate. Patient provided minimal responses to questions asked and required multiple prompts to engage. Patient reports constant suicidal thoughts that have been present her whole life, they are always in the back of my head. Reports recent exacerbation due to life stressors including difficulty caring for herself, facing eviction, child moved in with her last year, difficulty managing medical co-morbidities and financial stressors. Reports feelings of hopelessness and overwhelm as she has been trying to manage all responsibilities, but has been  struggling to do so. Reports these feelings culminated in suicidal thoughts with plan to overdose of her medications. Reports she sent goodbye texts to the main people in her life this morning. Per triage note, patient also reported homicidal ideation towards her mother and brother and has thought of plans to end their lives. Unable to further clarify given patient's presentation. Patient requests to end assessment due to overwhelming feelings of anxiety, concern for physical aggression (throwing things, hitting people) and asks for medication.  Upon initial encounter, patient was observed yelling in the hallway, continuously asking for assistance, but refusing assistance offered. She reported feeling overstimulated due to noises and lights and was observed standing with her face in the corner. With much prompting, she walked to the assessment room and opted to complete assessment with the lights off. She is alert/oriented x 4; anxious, and restless. Her speech appears pressured and she presents with poor to avoidant eye contact. Her thought process is tangential. She denies auditory and/or visual hallucinations. There is no objective indication that she is currently responding to external/internal stimuli or experiencing delusional thought content.   Patient amenable to further conversation once on unit: Patient reports poor sleep, poor appetite (2 snacks per  day), hopelessness, low energy, psychomotor retardation, sensitivity to lights and sounds and crying spells. She reports homicidal ideation towards mother and brother. Reports plan to sneak into brother's house in the middle of the night, tie up his arms and legs and beat him senseless with a baseball bat. Denies plan for mother, but reports she will think of one. Reports homicidal ideation towards staff member with thoughts to punch her in the face and make her Thanksgiving miserable. Denies intent to  act on these thoughts at this time. Reports  daily use of marijuana (all day every day). Reports first use at age 76, last use yesterday and daily use of about 3-5 blunts per day. Denies use fo alcohol of other substances. Psychiatrically, she reports previous psychiatric diagnoses of bipolar disorder and PTSD, reports psychiatrist has raised concern for BPD. Reports multiple psychiatric hospitalizations (reports most recent about 10 years ago at [facility]) and multiple suicide attempts (reports most recent about 15 years ago). Patient reports she discontinued her psychotropic medication due to wanting to focus on stabilization of her thyroid . Per chart review, patient is currently prescribed lamotrigine  200 mg BID and risperidone  0.5 mg nightly at bedtime.   Total Time spent with patient: 1.5 hours  Musculoskeletal  Strength & Muscle Tone: within normal limits Gait & Station: normal Patient leans: N/A  Psychiatric Specialty Exam  Presentation General Appearance: Disheveled  Eye Contact:Minimal; Poor  Speech:Pressured  Speech Volume:Normal  Handedness:Right   Mood and Affect  Mood:Anxious; Hopeless; Irritable  Affect:Labile; Tearful   Thought Process  Thought Processes:Goal Directed  Descriptions of Associations:Tangential  Orientation:Full (Time, Place and Person)  Thought Content:Perseveration    Hallucinations:Hallucinations: None  Ideas of Reference:None  Suicidal Thoughts:Suicidal Thoughts: Yes, Active SI Active Intent and/or Plan: With Intent; With Plan; With Means to Carry Out; With Access to Means  Homicidal Thoughts:Homicidal Thoughts: Yes, Active HI Active Intent and/or Plan: With Intent; With Plan   Sensorium  Memory:Immediate Good; Recent Good  Judgment:Poor  Insight:Poor   Executive Functions  Concentration:Poor  Attention Span:Poor  Recall:Fair  Fund of Knowledge:Fair  Language:Fair   Psychomotor Activity  Psychomotor Activity:Psychomotor Activity: Restlessness   Assets   Assets:Desire for Improvement   Sleep  Sleep:Sleep: Poor Number of Hours of Sleep: 5   Nutritional Assessment (For OBS and FBC admissions only) Has the patient had a weight loss or gain of 10 pounds or more in the last 3 months?: No Has the patient had a decrease in food intake/or appetite?: Yes Does the patient have dental problems?: Yes Does the patient have eating habits or behaviors that may be indicators of an eating disorder including binging or inducing vomiting?: No Has the patient recently lost weight without trying?: 0 Has the patient been eating poorly because of a decreased appetite?: 1 Malnutrition Screening Tool Score: 1    Physical Exam Pulmonary:     Effort: Pulmonary effort is normal.  Musculoskeletal:        General: Normal range of motion.  Skin:    General: Skin is warm and dry.  Neurological:     Mental Status: She is alert and oriented to person, place, and time.  Psychiatric:        Mood and Affect: Mood is anxious and depressed. Affect is tearful.        Speech: Speech is rapid and pressured.        Behavior: Behavior is hyperactive.        Thought Content: Thought content includes homicidal and suicidal ideation. Thought content includes homicidal and suicidal plan.    Review of Systems  Constitutional: Negative.   Respiratory: Negative.    Skin: Negative.   Psychiatric/Behavioral:  Positive for depression, substance abuse and suicidal ideas. The patient is nervous/anxious.     Blood pressure 112/64, pulse 66, temperature 97.6 F (36.4 C), temperature source Oral, resp. rate 16, SpO2 97%. There is no height or weight on file to calculate BMI.  Past Psychiatric History: PTSD, bipolar disorder,  and concerns for autism and borderline personality disorder. Patient reports a history of multiple psychiatric hospitalizations. Per chart review, patient receives outpatient treatment with [facility] (Dr. Arvella Finder).  Reports history of multiple psychotropic medication trials, unable to recall names and doses. Reports hx of multiple previous suicide attempts.   Is the patient at risk to self? Yes  Has the patient been a risk to self in the past 6 months? Yes .    Has the patient been a risk to self within the distant past? No   Is the patient a risk to others? Yes   Has the patient been a risk to others in the past 6 months? No   Has the patient been a risk to others within the distant past? No   Past Medical History: Hypothyrodism (hx of myxedema coma in 2024), vitamin D  deficiency, hyperlipidemia   Family History: Mother (bipolar disorder), brothers (borderline personality disorder, OCD), father (autism)  Social History: Reports living with her child and 5 dogs. Unemployed and receiving disability. Reports financial difficulties. Denies access to firearms.   Last Labs:  Admission on 02/11/2024  Component Date Value Ref Range Status   WBC 02/11/2024 9.9  4.0 - 10.5 K/uL Final   RBC 02/11/2024 4.00  3.87 - 5.11 MIL/uL Final   Hemoglobin 02/11/2024 11.8 (L)  12.0 - 15.0 g/dL Final   HCT 88/72/7974 37.2  36.0 - 46.0 % Final   MCV 02/11/2024 93.0  80.0 - 100.0 fL Final   MCH 02/11/2024 29.5  26.0 - 34.0 pg Final   MCHC 02/11/2024 31.7  30.0 - 36.0 g/dL Final   RDW 88/72/7974 14.9  11.5 - 15.5 % Final   Platelets 02/11/2024 411 (H)  150 - 400 K/uL Final   nRBC 02/11/2024 0.0  0.0 - 0.2 % Final   Neutrophils Relative % 02/11/2024 65  % Final   Neutro Abs 02/11/2024 6.3  1.7 - 7.7 K/uL Final   Lymphocytes Relative 02/11/2024 25  % Final   Lymphs Abs 02/11/2024 2.5  0.7 - 4.0 K/uL Final   Monocytes Relative 02/11/2024 7  % Final   Monocytes Absolute 02/11/2024 0.7  0.1 - 1.0 K/uL Final   Eosinophils Relative 02/11/2024 2  % Final   Eosinophils Absolute 02/11/2024 0.2  0.0 - 0.5 K/uL Final   Basophils Relative 02/11/2024 1  % Final   Basophils Absolute 02/11/2024 0.1  0.0 - 0.1 K/uL Final   Immature  Granulocytes 02/11/2024 0  % Final   Abs Immature Granulocytes 02/11/2024 0.04  0.00 - 0.07 K/uL Final   Performed at [facility], [address], [place], [place] [address]   Sodium 02/11/2024 142  135 - 145 mmol/L Final   Potassium 02/11/2024 3.8  3.5 - 5.1 mmol/L Final   Chloride 02/11/2024 105  98 - 111 mmol/L Final   CO2 02/11/2024 25  22 - 32 mmol/L Final   Glucose, Bld 02/11/2024 100 (H)  70 - 99 mg/dL Final   Glucose reference range applies only to samples taken after fasting for at least 8 hours.   BUN 02/11/2024 7 (L)  8 - 23 mg/dL Final   Creatinine, Ser 02/11/2024 0.69  0.44 - 1.00 mg/dL Final   Calcium  02/11/2024 9.1  8.9 - 10.3 mg/dL Final   Total Protein 88/72/7974 7.1  6.5 - 8.1 g/dL Final   Albumin 88/72/7974 3.4 (L)  3.5 - 5.0 g/dL Final   AST 88/72/7974 13 (L)  15 -  41 U/L Final   ALT 02/11/2024 10  0 - 44 U/L Final   Alkaline Phosphatase 02/11/2024 110  38 - 126 U/L Final   Total Bilirubin 02/11/2024 0.3  0.0 - 1.2 mg/dL Final   GFR, Estimated 02/11/2024 >60  >60 mL/min Final   Comment: (NOTE) Calculated using the CKD-EPI Creatinine Equation (2021)    Anion gap 02/11/2024 12  5 - 15 Final   Performed at [facility], [address] [place], [place] [address]   Alcohol, Ethyl (B) 02/11/2024 <15  <15 mg/dL Final   Comment: (NOTE) For medical purposes only. Performed at [facility], [address], [place], [place] [address]    Cholesterol 02/11/2024 145  0 - 200 mg/dL Final   Triglycerides 88/72/7974 78  <150 mg/dL Final   HDL 88/72/7974 59  >40 mg/dL Final   Total CHOL/HDL Ratio 02/11/2024 2.5  RATIO Final   VLDL 02/11/2024 16  0 - 40 mg/dL Final   LDL Cholesterol 02/11/2024 70  0 - 99 mg/dL Final   Comment:        Total Cholesterol/HDL:CHD Risk Coronary Heart Disease Risk Table                     Men   Women  1/2 Average Risk   3.4   3.3  Average Risk       5.0   4.4  2 X Average Risk   9.6   7.1  3 X Average Risk  23.4   11.0         Use the calculated Patient Ratio above and the CHD Risk Table to determine the patient's CHD Risk.        ATP III CLASSIFICATION (LDL):  <100     mg/dL   Optimal  899-870  mg/dL   Near or Above                    Optimal  130-159  mg/dL   Borderline  839-810  mg/dL   High  >809     mg/dL   Very High Performed at [facility], [address], [place], [place] [address]    TSH 02/11/2024 1.606  0.350 - 4.500 uIU/mL Final   Comment: Performed by a 3rd Generation assay with a functional sensitivity of <=0.01 uIU/mL. Performed at [facility], [address], [place], [place] [address]    POC Amphetamine UR 02/11/2024 None Detected  NONE DETECTED (Cut Off Level 1000 ng/mL) Final   POC Secobarbital (BAR) 02/11/2024 None Detected  NONE DETECTED (Cut Off Level 300 ng/mL) Final   POC Buprenorphine (BUP) 02/11/2024 None Detected  NONE DETECTED (Cut Off Level 10 ng/mL) Final   POC Oxazepam (BZO) 02/11/2024 None Detected  NONE DETECTED (Cut Off Level 300 ng/mL) Final   POC Cocaine UR 02/11/2024 None Detected  NONE DETECTED (Cut Off Level 300 ng/mL) Final   POC Methamphetamine UR 02/11/2024 None Detected  NONE DETECTED (Cut Off Level 1000 ng/mL) Final   POC Morphine 02/11/2024 None Detected  NONE DETECTED (Cut Off Level 300 ng/mL) Final   POC Methadone UR 02/11/2024 None Detected  NONE DETECTED (Cut Off Level 300 ng/mL) Final   POC Oxycodone  UR 02/11/2024 None Detected  NONE DETECTED (Cut Off Level 100 ng/mL) Final   POC Marijuana UR 02/11/2024 Positive (A)  NONE DETECTED (Cut Off Level 50 ng/mL) Final   Color, Urine 02/11/2024 YELLOW  YELLOW Final  APPearance 02/11/2024 CLEAR  CLEAR Final   Specific Gravity, Urine 02/11/2024 1.008  1.005 - 1.030 Final   pH 02/11/2024 5.0  5.0 - 8.0 Final   Glucose, UA 02/11/2024 NEGATIVE  NEGATIVE mg/dL Final   Hgb urine dipstick 02/11/2024 SMALL (A)  NEGATIVE Final   Bilirubin Urine 02/11/2024 NEGATIVE  NEGATIVE Final   Ketones, ur 02/11/2024  NEGATIVE  NEGATIVE mg/dL Final   Protein, ur 88/72/7974 30 (A)  NEGATIVE mg/dL Final   Nitrite 88/72/7974 NEGATIVE  NEGATIVE Final   Leukocytes,Ua 02/11/2024 NEGATIVE  NEGATIVE Final   RBC / HPF 02/11/2024 0-5  0 - 5 RBC/hpf Final   WBC, UA 02/11/2024 0-5  0 - 5 WBC/hpf Final   Bacteria, UA 02/11/2024 NONE SEEN  NONE SEEN Final   Squamous Epithelial / HPF 02/11/2024 0-5  0 - 5 /HPF Final   Performed at [facility], [address] [place], [place] [address]    Allergies: Patient has no known allergies.  Medications:  Facility Ordered Medications  Medication   acetaminophen  (TYLENOL ) tablet 650 mg   alum & mag hydroxide-simeth (MAALOX/MYLANTA) 200-200-20 MG/5ML suspension 30 mL   magnesium  hydroxide (MILK OF MAGNESIA) suspension 30 mL   haloperidol  (HALDOL ) tablet 5 mg   And   diphenhydrAMINE  (BENADRYL ) capsule 50 mg   haloperidol  lactate (HALDOL ) injection 5 mg   And   diphenhydrAMINE  (BENADRYL ) injection 50 mg   And   LORazepam  (ATIVAN ) injection 2 mg   haloperidol  lactate (HALDOL ) injection 10 mg   And   diphenhydrAMINE  (BENADRYL ) injection 50 mg   And   LORazepam  (ATIVAN ) injection 2 mg   hydrOXYzine  (ATARAX ) tablet 25 mg   traZODone  (DESYREL ) tablet 50 mg   [COMPLETED] levothyroxine  (SYNTHROID ) tablet 100 mcg   [START ON 02/12/2024] levothyroxine  (SYNTHROID ) tablet 100 mcg   [START ON 02/12/2024] atorvastatin  (LIPITOR) tablet 20 mg   PTA Medications  Medication Sig   levothyroxine  (SYNTHROID ) 100 MCG tablet Take 100 mcg by mouth daily.   atorvastatin  (LIPITOR) 20 MG tablet Take 1 tablet (20 mg total) by mouth daily.   lamoTRIgine  (LAMICTAL ) 200 MG tablet Take 1 tablet (200 mg total) by mouth 2 (two) times daily.   risperiDONE  (RISPERDAL ) 0.5 MG tablet Take 1 tablet (0.5 mg total) by mouth at bedtime.     Medical Decision Making  Patient is a 63 year old female with a history of BPAD and PTSD who presents with worsening depression, suicidal ideation with plan  and homicidal ideation with plan in the context of multiple psychosocial stressors. Current presentation appears most consistent with a depressive episode related to her historical BPAD diagnosis. Cannot rule out substance-induced mood disorder due to daily cannabis use, but less likely given timing of symptoms. Consider cluster B personality traits/borderline personality disorder due to impulsivity, affective instability, and difficulty controlling anger. Also, consider impact of unmanaged hypothyroidism related to patient's presentation (depressive symptomology). Will order TSH, free T4 and free T3. Patient is at elevated risk of suicide/dangerousness to others and further worsening of psychiatric conditions. Risk factors for suicide for this patient include: no history of significant relationship, current substance use , current treatment non-adherence , hopelessness, suicide plan, and chronic mood lability. Protective factors for this patient include: no known access to weapons or firearms and motivation for treatment. Due to active suicidal and homicidal ideation with plan, lack of support system, treatment non-adherence and lack of adequate outpatient psychiatric follow up, recommend inpatient hospitalization for safety, stabilization, and further evaluation. The  treatment plan, including the need for inpatient admission, was discussed with the patient, who verbalized understanding and agreement with plan. The patient will remain under supervision on the observation unit while awaiting bed placement at an inpatient facility.   PLAN:  --Admission: Observation unit, pending acceptance to inpatient facility  --Disposition: Voluntary --Medical Interventions:     -Labs: CBC, CMP, Lipid panel, HbA1c, TSH, free T3, free T4, Urine drug screen, Urine pregnancy test , Ethanol, COVID PCR and EKG.     -Medications:      -acetaminophen  650 mg Q6H PRN for mild pain (pain score 1-3)       -Maalox suspension 30 mLs  Q4H PRN for indigestion       -hydroxyzine  25 mg TID PRN for anxiety       -magnesium  hydroxide suspension 30 mLs daily PRN for mild constipation       -trazodone  50 mg daily at bedtime PRN for sleep     -Home medications:        -Continue atorvastatin  20 mg every morning for hyperlipidemia        -Continue Synthroid  100 mcg every morning for hypothyroidism         -Discontinue lamotrigine  200 mg BID (will not restart due to current non-adherence, need to restart titration schedule and delayed onset of therapeutic effect)      -Discontinue risperidone  0.5 mg nightly at bedtime (will not restart due to patient's preference)  --Psychiatric Interventions:     -Agitation Protocol    -Defer initiation of psychotropic medication to accepting facility   Recommendations  Based on my evaluation the patient does not appear to have an emergency medical condition.  Rolin DELENA Lipps, NP 02/11/24  1:18 PM

## 2024-02-11 NOTE — ED Notes (Signed)
 Pt w/ hx of bipolar. Admitted to continuous assessment unit voluntarily. Pt endorses SI w/ plan to OD if she leaves here today and HI towards her brother and mother and 1 MHT staff member here. Pt c/o bright lights and being overstimulated. Skin check remarkable for scratch on L shin.

## 2024-02-11 NOTE — Progress Notes (Addendum)
 Patient is a 63 year old female admitted voluntarily to the [facility] floor from [facility] with SI thoughts to overdose on pills, HI against brother and mother, complaints of worsening depression, anxiety, and emotional distress. Patient presents to assessment via transport chair but is ambulatory. She is A+O x 4. She currently endorses passive SI without a plan and still endorses HI. It is hard to navigate through life. I lost my sh*t. It is hard adjusting to life. She does agree to contract for safety on the unit however. Patient's affect is apprehensive and speech is logical and coherent. Patient endorses depression and anxiety 10/10. She states that her main stressor is not being able to afford medical care. Abbreviated medical history includes: hypothyroidism and GERD. She currently endorses chronic right knee pain of 4/10. Patient denies the use of a mobility aid at home. Reports the use of eyeglasses but does not own a pair at all. Reports last BM yesterday February 10, 2024. Patient denies smoking cigs and drinking alcohol. Endorses marijuana use and UDS was positive for the substance. Patient says that she does not have a support system. Her goal while she is here is "to start feeling better."   Skin assessment and body search completed with Leita, MHT. Skin: warm/dry. Scratches bilat legs, mid abd scar, lower back scar. No contrabands found.   Emotional support and reassurance provided throughout admission intake. Consents signed. Afterwards, oriented patient to unit, room and call light, reviewed POC with all questions answered and concerns voiced. Patient verbalized understanding. Denies any needs at this time.  Will continue to monitor with ongoing Q 15 minute safety checks.

## 2024-02-11 NOTE — Plan of Care (Signed)
  Problem: Safety: Goal: Ability to disclose and discuss suicidal ideas will improve Outcome: Progressing   Problem: Self-Concept: Goal: Will verbalize positive feelings about self Outcome: Progressing   Problem: Role Relationship: Goal: Will demonstrate positive changes in social behaviors and relationships Outcome: Not Progressing   Problem: Safety: Goal: Ability to identify and utilize support systems that promote safety will improve Outcome: Not Progressing   Problem: Self-Concept: Goal: Level of anxiety will decrease Outcome: Not Progressing

## 2024-02-12 LAB — T3, FREE: T3, Free: 2.2 pg/mL (ref 2.0–4.4)

## 2024-02-12 LAB — HEMOGLOBIN A1C
Hgb A1c MFr Bld: 5.4 % (ref 4.8–5.6)
Mean Plasma Glucose: 108 mg/dL

## 2024-02-12 MED ORDER — LAMOTRIGINE 25 MG PO TABS
25.0000 mg | ORAL_TABLET | Freq: Two times a day (BID) | ORAL | Status: DC
  Administered 2024-02-12 – 2024-02-14 (×5): 25 mg via ORAL
  Filled 2024-02-12 (×5): qty 1

## 2024-02-12 MED ORDER — VENLAFAXINE HCL ER 37.5 MG PO CP24
37.5000 mg | ORAL_CAPSULE | Freq: Every day | ORAL | Status: DC
  Administered 2024-02-13 – 2024-02-14 (×2): 37.5 mg via ORAL
  Filled 2024-02-12 (×2): qty 1

## 2024-02-12 MED ORDER — HYDROXYZINE HCL 25 MG PO TABS
25.0000 mg | ORAL_TABLET | Freq: Four times a day (QID) | ORAL | Status: DC | PRN
  Administered 2024-02-12 – 2024-02-18 (×8): 25 mg via ORAL
  Filled 2024-02-12 (×8): qty 1
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
## 2024-02-12 NOTE — Progress Notes (Signed)
 SI/HI: denies  Behavior/Mood:alert and oriented. Flat affect. Endorses anxiety dt life stressors.  Intervention/Group: isolative  Medications/PRNs: PRNs given for anxiety and insomnia.  Pain: denies  Other: slept 11 hours   02/11/24 2200  Psych Admission Type (Psych Patients Only)  Admission Status Voluntary  Psychosocial Assessment  Patient Complaints Anxiety;Depression  Eye Contact Fair  Facial Expression Sullen  Affect Apprehensive  Speech Logical/coherent  Interaction Cautious  Motor Activity Slow  Appearance/Hygiene In scrubs  Behavior Characteristics Anxious  Mood Anxious  Thought Process  Coherency WDL  Content Blaming others  Delusions None reported or observed  Perception WDL  Hallucination None reported or observed  Judgment Impaired  Confusion None  Danger to Self  Current suicidal ideation? Passive  Description of Suicide Plan no plan  Agreement Not to Harm Self Yes  Description of Agreement verbal

## 2024-02-12 NOTE — Group Note (Signed)
 Date:  02/12/2024 Time:  10:25 AM  Group Topic/Focus:  Dimensions of Wellness:   The focus of this group is to introduce the topic of wellness and discuss the role each dimension of wellness plays in total health. Self Care:   The focus of this group is to help patients understand the importance of self-care in order to improve or restore emotional, physical, spiritual, interpersonal, and financial health.    Participation Level:  Did Not Attend  Participation Quality:  Pt did not attend afternoon group.  Affect:  Appropriate  Cognitive:  Appropriate  Insight: Appropriate  Engagement in Group:  Pt did not attend afternoon group.  Modes of Intervention:  Pt did not attend afternoon group.  Additional Comments:  Pt did not attend afternoon group.  [name] Ryder 02/12/2024, 10:25 AM

## 2024-02-12 NOTE — Group Note (Signed)
 Recreation Therapy Group Note   Group Topic:Leisure Education  Group Date: 02/12/2024 Start Time: 1215 End Time: 1300 Facilitators: Celestia Jeoffrey BRAVO, LRT, CTRS Location: Dayroom  Group Description: Bingo. Patients played multiple rounds of bingo. LRT and pts discussed the definition of leisure, things they do in their free time outside of the hospital, and how bingo is also a leisure activity. Pts received a journal or coloring book as their bingo prize.   Goal Area(s) Addressed:  Patient will identify a current leisure interest.  Patient will learn the definition of "leisure". Patient will have the opportunity to try a new leisure activity. Patient will communicate with peers and LRT.   Affect/Mood: N/A   Participation Level: Did not attend    Clinical Observations/Individualized Feedback: Patient did not attend group.   Plan: Continue to engage patient in RT group sessions 2-3x/week.   Jeoffrey BRAVO Celestia, LRT, CTRS 02/12/2024 1:53 PM

## 2024-02-12 NOTE — Plan of Care (Signed)

## 2024-02-12 NOTE — Progress Notes (Signed)
 Behavior: Episodes of tearfulness but redirectable during shift.   Psych assessment:  Denies SI/HI/AVH. Flat but responds appropriately  Interaction / Group attendance:   Attended group and present  during meals  Medication/ PRNs:  PRN acetaminophen  for chronic arthritic pain in knees and shoulders. PNA and Flu shot given. All other medications given as prescribed  Pain:  Chronic pain from arthritis in knees and shoulder reported this shift.   15 min checks in place for safety.   Ongoing monitoring continues

## 2024-02-12 NOTE — Plan of Care (Signed)
  Problem: Self-Concept: Goal: Will verbalize positive feelings about self Outcome: Progressing   Problem: Self-Concept: Goal: Level of anxiety will decrease Outcome: Not Progressing

## 2024-02-12 NOTE — Group Note (Signed)
 Date:  02/12/2024 Time:  9:33 PM  Group Topic/Focus:  Coping With Mental Health Crisis:   The purpose of this group is to help patients identify strategies for coping with mental health crisis.  Group discusses possible causes of crisis and ways to manage them effectively.    Participation Level:  Active  Participation Quality:  Appropriate  Affect:  Appropriate  Cognitive:  Appropriate  Insight: Appropriate  Engagement in Group:  Engaged  Modes of Intervention:  Discussion  Additional Comments:    Leigh VEAR Pais 02/12/2024, 9:33 PM

## 2024-02-12 NOTE — BH IP Treatment Plan (Signed)
 Interdisciplinary Treatment and Diagnostic Plan Update  02/12/2024 Time of Session: 10:37 AM  Hollyn Stucky MRN: [id number]  Principal Diagnosis: Bipolar disorder, current episode depressed, severe, with psychotic features (HCC)  Secondary Diagnoses: Principal Problem:   Bipolar disorder, current episode depressed, severe, with psychotic features (HCC)   Current Medications:  Current Facility-Administered Medications  Medication Dose Route Frequency Provider Last Rate Last Admin   acetaminophen  (TYLENOL ) tablet 650 mg  650 mg Oral Q6H PRN Levy, Jasmine A, NP   650 mg at 02/11/24 2143   alum & mag hydroxide-simeth (MAALOX/MYLANTA) 200-200-20 MG/5ML suspension 30 mL  30 mL Oral Q4H PRN Levy, Jasmine A, NP       atorvastatin  (LIPITOR) tablet 20 mg  20 mg Oral Daily Levy, Jasmine A, NP   20 mg at 02/12/24 9091   haloperidol  (HALDOL ) tablet 5 mg  5 mg Oral TID PRN Levy, Jasmine A, NP       And   diphenhydrAMINE  (BENADRYL ) capsule 50 mg  50 mg Oral TID PRN Levy, Jasmine A, NP       haloperidol  lactate (HALDOL ) injection 5 mg  5 mg Intramuscular TID PRN Levy, Jasmine A, NP       And   diphenhydrAMINE  (BENADRYL ) injection 50 mg  50 mg Intramuscular TID PRN Levy, Jasmine A, NP       And   LORazepam  (ATIVAN ) injection 2 mg  2 mg Intramuscular TID PRN Levy, Jasmine A, NP       haloperidol  lactate (HALDOL ) injection 10 mg  10 mg Intramuscular TID PRN Levy, Jasmine A, NP       And   diphenhydrAMINE  (BENADRYL ) injection 50 mg  50 mg Intramuscular TID PRN Levy, Jasmine A, NP       And   LORazepam  (ATIVAN ) injection 2 mg  2 mg Intramuscular TID PRN Levy, Jasmine A, NP       hydrOXYzine  (ATARAX ) tablet 25 mg  25 mg Oral TID PRN Levy, Jasmine A, NP   25 mg at 02/12/24 0908   influenza vac split trivalent PF (FLUZONE ) injection 0.5 mL  0.5 mL Intramuscular Tomorrow-1000 Donnelly Mellow, MD       levothyroxine  (SYNTHROID ) tablet 100 mcg  100 mcg Oral Q0600 Levy, Jasmine A, NP   100 mcg at 02/12/24 9356    magnesium  hydroxide (MILK OF MAGNESIA) suspension 30 mL  30 mL Oral Daily PRN Levy, Jasmine A, NP       pneumococcal 20-valent conjugate vaccine (PREVNAR 20 ) injection 0.5 mL  0.5 mL Intramuscular Tomorrow-1000 Donnelly Mellow, MD       traZODone  (DESYREL ) tablet 50 mg  50 mg Oral QHS PRN Levy, Jasmine A, NP       PTA Medications: Medications Prior to Admission  Medication Sig Dispense Refill Last Dose/Taking   atorvastatin  (LIPITOR) 20 MG tablet Take 1 tablet (20 mg total) by mouth daily. 90 tablet 1    levothyroxine  (SYNTHROID ) 100 MCG tablet Take 100 mcg by mouth daily.       Patient Stressors: Health problems   Marital or family conflict    Patient Strengths: Ability for insight  Communication skills   Treatment Modalities: Medication Management, Group therapy, Case management,  1 to 1 session with clinician, Psychoeducation, Recreational therapy.   Physician Treatment Plan for Primary Diagnosis: Bipolar disorder, current episode depressed, severe, with psychotic features (HCC) Long Term Goal(s):     Short Term Goals:    Medication Management: Evaluate patient's response, side effects, and tolerance of medication  regimen.  Therapeutic Interventions: 1 to 1 sessions, Unit Group sessions and Medication administration.  Evaluation of Outcomes: Not Progressing  Physician Treatment Plan for Secondary Diagnosis: Principal Problem:   Bipolar disorder, current episode depressed, severe, with psychotic features (HCC)  Long Term Goal(s):     Short Term Goals:       Medication Management: Evaluate patient's response, side effects, and tolerance of medication regimen.  Therapeutic Interventions: 1 to 1 sessions, Unit Group sessions and Medication administration.  Evaluation of Outcomes: Not Progressing   RN Treatment Plan for Primary Diagnosis: Bipolar disorder, current episode depressed, severe, with psychotic features (HCC) Long Term Goal(s): Knowledge of disease and  therapeutic regimen to maintain health will improve  Short Term Goals: Ability to remain free from injury will improve, Ability to verbalize frustration and anger appropriately will improve, Ability to demonstrate self-control, Ability to participate in decision making will improve, Ability to verbalize feelings will improve, Ability to disclose and discuss suicidal ideas, Ability to identify and develop effective coping behaviors will improve, and Compliance with prescribed medications will improve  Medication Management: RN will administer medications as ordered by provider, will assess and evaluate patient's response and provide education to patient for prescribed medication. RN will report any adverse and/or side effects to prescribing provider.  Therapeutic Interventions: 1 on 1 counseling sessions, Psychoeducation, Medication administration, Evaluate responses to treatment, Monitor vital signs and CBGs as ordered, Perform/monitor CIWA, COWS, AIMS and Fall Risk screenings as ordered, Perform wound care treatments as ordered.  Evaluation of Outcomes: Not Progressing   LCSW Treatment Plan for Primary Diagnosis: Bipolar disorder, current episode depressed, severe, with psychotic features (HCC) Long Term Goal(s): Safe transition to appropriate next level of care at discharge, Engage patient in therapeutic group addressing interpersonal concerns.  Short Term Goals: Engage patient in aftercare planning with referrals and resources, Increase social support, Increase ability to appropriately verbalize feelings, Increase emotional regulation, Facilitate acceptance of mental health diagnosis and concerns, Facilitate patient progression through stages of change regarding substance use diagnoses and concerns, Identify triggers associated with mental health/substance abuse issues, and Increase skills for wellness and recovery  Therapeutic Interventions: Assess for all discharge needs, 1 to 1 time with Social  worker, Explore available resources and support systems, Assess for adequacy in community support network, Educate family and significant other(s) on suicide prevention, Complete Psychosocial Assessment, Interpersonal group therapy.  Evaluation of Outcomes: Not Progressing   Progress in Treatment: Attending groups: Yes. and No. Participating in groups: Yes. and No. Taking medication as prescribed: Yes. Toleration medication: Yes. Family/Significant other contact made: No, will contact:  CSW will contact if given permission  Patient understands diagnosis: Yes. Discussing patient identified problems/goals with staff: Yes. Medical problems stabilized or resolved: Yes. Denies suicidal/homicidal ideation: Yes. Issues/concerns per patient self-inventory: No. Other: None   New problem(s) identified: No, Describe:  None identified   New Short Term/Long Term Goal(s): elimination of symptoms of psychosis, medication management for mood stabilization; elimination of SI thoughts; development of comprehensive mental wellness plan.   Patient Goals:  I want to be able to function, I can't function   Discharge Plan or Barriers: CSW will assist with appropriate discharge planning   Reason for Continuation of Hospitalization: Anxiety Medication stabilization   Estimated Length of Stay: 1 to 7 days  Last 3 [place] Suicide Severity Risk Score: Flowsheet Row Admission (Current) from 02/11/2024 in [facility] Most recent reading at 02/11/2024  6:00 PM ED from 02/11/2024 in [facility] Most recent reading at 02/11/2024 11:21 AM ED to Hosp-Admission (Discharged) from 12/20/2022 in [place] [facility] Medical Unit Most recent reading at 12/20/2022  7:03 AM  C-SSRS RISK CATEGORY High Risk High Risk No Risk    Last PHQ 2/9 Scores:    05/13/2023   11:30 AM 01/21/2021   11:31 AM 08/28/2017    4:31 PM  Depression screen PHQ 2/9  Decreased Interest 1 1 1    Down, Depressed, Hopeless 1 1 1   PHQ - 2 Score 2 2 2   Altered sleeping 2 2 3   Tired, decreased energy 3 1 3   Change in appetite 3 1 2   Feeling bad or failure about yourself  3 2 1   Trouble concentrating 3 0 2  Moving slowly or fidgety/restless 2 0 1  Suicidal thoughts 0 0 0  PHQ-9 Score 18  8  14    Difficult doing work/chores Very difficult Not difficult at all      Data saved with a previous flowsheet row definition    Scribe for Treatment Team: Lum [name], ISRA[place] 02/12/2024 12:10 PM

## 2024-02-12 NOTE — Group Note (Deleted)
 Recreation Therapy Group Note   Group Topic:Leisure Education  Group Date: 02/12/2024 Start Time: 1120 End Time: 1200 Facilitators: Celestia Jeoffrey BRAVO, LRT Location: Craft Room  Group Description: Leisure. Patients were given the option to choose from journaling, coloring, drawing, making origami, playing with playdoh, listening to music or singing karaoke. LRT and pts discussed the meaning of leisure, the importance of participating in leisure during their free time/when they're outside of the hospital, as well as how our leisure interests can also serve as coping skills.   Goal Area(s) Addressed:  Patient will identify a current leisure interest.  Patient will learn the definition of "leisure". Patient will practice making a positive decision. Patient will have the opportunity to try a new leisure activity. Patient will communicate with peers and LRT.      Affect/Mood: {RT BHH Affect/Mood:26271}   Participation Level: {RT BHH Participation Level:26267}   Participation Quality: {RT BHH Participation Quality:26268}   Behavior: {RT BHH Group Behavior:26269}   Speech/Thought Process: {RT BHH Speech/Thought:26276}   Insight: {RT BHH Insight:26272}   Judgement: {RT BHH Judgement:26278}   Modes of Intervention: {RT BHH Modes of Intervention:26277}   Patient Response to Interventions:  {RT BHH Patient Response to Intervention:26274}   Education Outcome:  {RT BHH Education Outcome:26279}   Clinical Observations/Individualized Feedback: *** was *** in their participation of session activities and group discussion. Pt identified ***   Plan: {RT BHH Tx Eojw:73719}   Jeoffrey BRAVO Celestia, LRT,  02/12/2024 1:30 PM

## 2024-02-12 NOTE — BHH Suicide Risk Assessment (Signed)
 [facility] Admission Suicide Risk Assessment   Nursing information obtained from:    Demographic factors:  Caucasian, Low socioeconomic status, Unemployed Current Mental Status:  Thoughts of violence towards others, Self-harm thoughts Loss Factors:  Decline in physical health, Financial problems / change in socioeconomic status Historical Factors:  Family history of mental illness or substance abuse Risk Reduction Factors:  Living with another person, especially a relative  Total Time spent with patient: 30 minutes Principal Problem: Bipolar disorder, current episode depressed, severe, with psychotic features (HCC) Diagnosis:  Principal Problem:   Bipolar disorder, current episode depressed, severe, with psychotic features (HCC)  Subjective Data: Patient is a 63 year old female admitted voluntarily to the [facility] Psych floor from [facility] with SI thoughts to overdose on pills, HI against brother and mother, complaints of worsening depression, anxiety, and emotional distress .Patient is admitted to [facility] unit with Q15 min safety monitoring. Multidisciplinary team approach is offered. Medication management; group/milieu therapy is offered.   Continued Clinical Symptoms:  Alcohol Use Disorder Identification Test Final Score (AUDIT): 0 The Alcohol Use Disorders Identification Test, Guidelines for Use in Primary Care, Second Edition.  World Science Writer [facility]). Score between 0-7:  no or low risk or alcohol related problems. Score between 8-15:  moderate risk of alcohol related problems. Score between 16-19:  high risk of alcohol related problems. Score 20 or above:  warrants further diagnostic evaluation for alcohol dependence and treatment.   CLINICAL FACTORS:   Depression:   Impulsivity   Musculoskeletal: Strength & Muscle Tone: within normal limits Gait & Station: normal Patient leans: N/A  Psychiatric Specialty Exam:  Presentation  General Appearance:  Appropriate for  Environment  Eye Contact: Fleeting  Speech: Pressured  Speech Volume: Increased  Handedness: Right   Mood and Affect  Mood: Anxious; Depressed; Irritable  Affect: Labile; Depressed   Thought Process  Thought Processes: Irrevelant  Descriptions of Associations:Circumstantial  Orientation:Full (Time, Place and Person)  Thought Content:Illogical  History of Schizophrenia/Schizoaffective disorder:No  Duration of Psychotic Symptoms:No data recorded Hallucinations:Hallucinations: None  Ideas of Reference:None  Suicidal Thoughts:Suicidal Thoughts: Yes, Active SI Active Intent and/or Plan: With Intent; With Plan  Homicidal Thoughts:Homicidal Thoughts: No HI Active Intent and/or Plan: With Intent; With Plan   Sensorium  Memory: Immediate Fair; Recent Fair  Judgment: Impaired  Insight: Shallow   Executive Functions  Concentration: Poor  Attention Span: Poor  Recall: [employer] of Knowledge: Fair  Language: Fair   Psychomotor Activity  Psychomotor Activity: Psychomotor Activity: Restlessness   Assets  Assets: Manufacturing Systems Engineer; Social Support   Sleep  Sleep: Sleep: Poor Number of Hours of Sleep: 5    Physical Exam: Physical Exam ROS Blood pressure (!) 108/49, pulse 81, temperature 98.2 F (36.8 C), resp. rate 15, height 5' 6 (1.676 m), weight 55.1 kg, SpO2 99%. Body mass index is 19.61 kg/m.   COGNITIVE FEATURES THAT CONTRIBUTE TO RISK:  None    SUICIDE RISK:   Minimal: No identifiable suicidal ideation.  Patients presenting with no risk factors but with morbid ruminations; may be classified as minimal risk based on the severity of the depressive symptoms  PLAN OF CARE: Patient is admitted to [facility] psych unit with Q15 min safety monitoring. Multidisciplinary team approach is offered. Medication management; group/milieu therapy is offered.   I certify that inpatient services furnished can reasonably be expected to  improve the patient's condition.   Allyn Foil, MD 02/12/2024, 12:57 PM

## 2024-02-13 MED ORDER — OLANZAPINE 5 MG PO TABS
2.5000 mg | ORAL_TABLET | Freq: Two times a day (BID) | ORAL | Status: DC
  Administered 2024-02-13 – 2024-02-18 (×11): 2.5 mg via ORAL
  Filled 2024-02-13 (×11): qty 1

## 2024-02-13 NOTE — Progress Notes (Signed)
 [facility] MD Progress Note  02/13/2024 2:08 PM Rachel Everett  MRN:  [id number] Patient is a 63 year old female admitted voluntarily to the [facility] Psych floor from [facility] with SI thoughts to overdose on pills, HI against brother and mother, complaints of worsening depression, anxiety, and emotional distress.Patient is admitted to [facility] unit with Q15 min safety monitoring. Multidisciplinary team approach is offered. Medication management; group/milieu therapy is offered.   Subjective:  Chart reviewed, case discussed in multidisciplinary meeting, patient seen during rounds.   Patient is noted to be resting in bed.  She reports ongoing depression and anxiety.  She informed the provider that last night she became very agitated and had thoughts of harming self.  She informed the nurses and was able to sit in the day area with plan of sight.  She reports having ongoing homicidal thoughts regarding her brother and mother who took the property from her.  She reports that she needs to be stable before she gets discharged or makes statements of hurting somebody when she gets enraged.  Patient denies hallucinations.  She reports fair appetite and sleep.  She is taking medications and is tolerating them well  Past Psychiatric History: see h&P Family History:  Family History  Problem Relation Age of Onset   Breast cancer Mother 40 - 60       recur 28s   Cancer Mother    Breast cancer Maternal Grandmother 38 - 27   Cancer Maternal Grandmother    Colon cancer Neg Hx    Esophageal cancer Neg Hx    Rectal cancer Neg Hx    Stomach cancer Neg Hx    BRCA 1/2 Neg Hx    Social History:  Social History   Substance and Sexual Activity  Alcohol Use Yes   Alcohol/week: 0.0 standard drinks of alcohol   Comment: rare     Social History   Substance and Sexual Activity  Drug Use No    Social History   Socioeconomic History   Marital status: Single    Spouse name: Not on file   Number of children: Not on file    Years of education: Not on file   Highest education level: Bachelor's degree (e.g., BA, AB, BS)  Occupational History   Not on file  Tobacco Use   Smoking status: Former    Current packs/day: 0.00    Average packs/day: 0.5 packs/day for 1 year (0.5 ttl pk-yrs)    Types: Cigarettes    Start date: 10/04/2014    Quit date: 10/04/2015    Years since quitting: 8.3   Smokeless tobacco: Never  Vaping Use   Vaping status: Former  Substance and Sexual Activity   Alcohol use: Yes    Alcohol/week: 0.0 standard drinks of alcohol    Comment: rare   Drug use: No   Sexual activity: Yes    Birth control/protection: Post-menopausal  Other Topics Concern   Not on file  Social History Narrative   Not on file   Social Drivers of Health   Financial Resource Strain: High Risk (06/26/2023)   Overall Financial Resource Strain (CARDIA)    Difficulty of Paying Living Expenses: Very hard  Food Insecurity: Food Insecurity Present (02/11/2024)   Hunger Vital Sign    Worried About Running Out of Food in the Last Year: Often true    Ran Out of Food in the Last Year: Often true  Transportation Needs: Unmet Transportation Needs (02/11/2024)   PRAPARE - Transportation  Lack of Transportation (Medical): Yes    Lack of Transportation (Non-Medical): Yes  Physical Activity: Inactive (06/26/2023)   Exercise Vital Sign    Days of Exercise per Week: 0 days    Minutes of Exercise per Session: 0 min  Stress: Stress Concern Present (06/26/2023)   [employer] of Occupational Health - Occupational Stress Questionnaire    Feeling of Stress : Rather much  Social Connections: Socially Isolated (02/11/2024)   Social Connection and Isolation Panel    Frequency of Communication with Friends and Family: Never    Frequency of Social Gatherings with Friends and Family: Never    Attends Religious Services: Never    Database Administrator or Organizations: Yes    Attends Engineer, Structural: Never     Marital Status: Never married   Past Medical History:  Past Medical History:  Diagnosis Date   Anxiety    Bipolar 1 disorder (HCC)    Depression    GERD (gastroesophageal reflux disease)    Hyperlipidemia    SVD (spontaneous vaginal delivery)    x 1   Thyroid  disease     Past Surgical History:  Procedure Laterality Date   ANKLE SURGERY     right    ANTERIOR CRUCIATE LIGAMENT REPAIR Left    BACK SURGERY     L5-S1   CHOLECYSTECTOMY     FRACTURE SURGERY Left    elbow   TENDON REPAIR Right    angle   TONSILLECTOMY      Current Medications: Current Facility-Administered Medications  Medication Dose Route Frequency Provider Last Rate Last Admin   acetaminophen  (TYLENOL ) tablet 650 mg  650 mg Oral Q6H PRN Levy, Jasmine A, NP   650 mg at 02/13/24 1013   alum & mag hydroxide-simeth (MAALOX/MYLANTA) 200-200-20 MG/5ML suspension 30 mL  30 mL Oral Q4H PRN Levy, Jasmine A, NP       atorvastatin  (LIPITOR) tablet 20 mg  20 mg Oral Daily Levy, Jasmine A, NP   20 mg at 02/13/24 9173   haloperidol  (HALDOL ) tablet 5 mg  5 mg Oral TID PRN Levy, Jasmine A, NP       And   diphenhydrAMINE  (BENADRYL ) capsule 50 mg  50 mg Oral TID PRN Levy, Jasmine A, NP       haloperidol  lactate (HALDOL ) injection 5 mg  5 mg Intramuscular TID PRN Levy, Jasmine A, NP       And   diphenhydrAMINE  (BENADRYL ) injection 50 mg  50 mg Intramuscular TID PRN Levy, Jasmine A, NP       And   LORazepam  (ATIVAN ) injection 2 mg  2 mg Intramuscular TID PRN Levy, Jasmine A, NP       haloperidol  lactate (HALDOL ) injection 10 mg  10 mg Intramuscular TID PRN Levy, Jasmine A, NP       And   diphenhydrAMINE  (BENADRYL ) injection 50 mg  50 mg Intramuscular TID PRN Levy, Jasmine A, NP       And   LORazepam  (ATIVAN ) injection 2 mg  2 mg Intramuscular TID PRN Levy, Jasmine A, NP       hydrOXYzine  (ATARAX ) tablet 25 mg  25 mg Oral Q6H PRN Tejas Seawood, MD   25 mg at 02/13/24 1013   lamoTRIgine  (LAMICTAL ) tablet 25 mg  25 mg Oral BID  Knox Cervi, MD   25 mg at 02/13/24 0826   levothyroxine  (SYNTHROID ) tablet 100 mcg  100 mcg Oral Q0600 Levy, Jasmine A, NP   100 mcg  at 02/13/24 0505   magnesium  hydroxide (MILK OF MAGNESIA) suspension 30 mL  30 mL Oral Daily PRN Levy, Jasmine A, NP       OLANZapine (ZYPREXA) tablet 2.5 mg  2.5 mg Oral BID Swayze Kozuch, MD   2.5 mg at 02/13/24 1304   traZODone  (DESYREL ) tablet 50 mg  50 mg Oral QHS PRN Levy, Jasmine A, NP   50 mg at 02/12/24 2113   venlafaxine XR (EFFEXOR-XR) 24 hr capsule 37.5 mg  37.5 mg Oral Q breakfast Glen Kesinger, MD   37.5 mg at 02/13/24 9173    Lab Results: No results found for this or any previous visit (from the past 48 hours).  Blood Alcohol level:  Lab Results  Component Value Date   [facility] <15 02/11/2024    Metabolic Disorder Labs: Lab Results  Component Value Date   HGBA1C 5.4 02/11/2024   MPG 108 02/11/2024   No results found for: PROLACTIN Lab Results  Component Value Date   CHOL 145 02/11/2024   TRIG 78 02/11/2024   HDL 59 02/11/2024   CHOLHDL 2.5 02/11/2024   VLDL 16 02/11/2024   LDLCALC 70 02/11/2024   LDLCALC 86 05/13/2023    Physical Findings: AIMS:  , ,  ,  ,    CIWA:    COWS:      Psychiatric Specialty Exam:  Presentation  General Appearance:  Appropriate for Environment  Eye Contact: Fleeting  Speech: Pressured  Speech Volume: Increased    Mood and Affect  Mood: Anxious; Depressed; Irritable  Affect: Labile; Depressed   Thought Process  Thought Processes: Irrevelant  Orientation:Full (Time, Place and Person)  Thought Content:Illogical  Hallucinations:Hallucinations: None  Ideas of Reference:None  Suicidal Thoughts:Suicidal Thoughts: Yes, Active SI Active Intent and/or Plan: With Intent; With Plan  Homicidal Thoughts:Homicidal Thoughts: No   Sensorium  Memory: Immediate Fair; Recent Fair  Judgment: Impaired  Insight: Shallow   Executive Functions   Concentration: Poor  Attention Span: Poor  Recall: [employer] of Knowledge: Fair  Language: Fair   Psychomotor Activity  Psychomotor Activity: Psychomotor Activity: Restlessness  Musculoskeletal: Strength & Muscle Tone: within normal limits Gait & Station: normal Assets  Assets: Manufacturing Systems Engineer; Social Support    Physical Exam: Physical Exam ROS Blood pressure 117/65, pulse 94, temperature 98.1 F (36.7 C), resp. rate 16, height 5' 6 (1.676 m), weight 55.1 kg, SpO2 98%. Body mass index is 19.61 kg/m.  Diagnosis: Principal Problem:   Bipolar disorder, current episode depressed, severe, with psychotic features [facility])   Clinical Decision Making: Patient currently admitted for worsening depression, suicidal ideation with plan to overdose on pills and HI towards family members in the context of property conflicts.  Patient will be monitored closely for safety  Treatment Plan Summary:  Safety and Monitoring:             -- Voluntary admission to inpatient psychiatric unit for safety, stabilization and treatment             -- Daily contact with patient to assess and evaluate symptoms and progress in treatment             -- Patient's case to be discussed in multi-disciplinary team meeting             -- Observation Level: q15 minute checks             -- Vital signs:  q12 hours             --  Precautions: suicide, elopement, and assault   2. Psychiatric Diagnoses and Treatment:               Effexor XR 37.5 mg daily will be started for depression and anxiety, Lamictal  25 mg twice daily was started to help with mood stabilization.  Hydroxyzine  as needed was increased to 50 mg every 6 hours to help with anxiety   -- The risks/benefits/side-effects/alternatives to this medication were discussed in detail with the patient and time was given for questions. The patient consents to medication trial.                -- Metabolic profile and EKG monitoring obtained  while on an atypical antipsychotic (BMI: Lipid Panel: HbgA1c: QTc:)              -- Encouraged patient to participate in unit milieu and in scheduled group therapies                            3. Medical Issues Being Addressed:    4. Discharge Planning:   -- Social work and case management to assist with discharge planning and identification of hospital follow-up needs prior to discharge  -- Estimated LOS: 3-4 days  Allyn Foil, MD 02/13/2024, 2:08 PM

## 2024-02-13 NOTE — Group Note (Signed)
 Date:  02/13/2024 Time:  11:07 PM  Group Topic/Focus:  Healthy Communication:   The focus of this group is to discuss communication, barriers to communication, as well as healthy ways to communicate with others.    Participation Level:  Active  Participation Quality:  Appropriate  Affect:  Appropriate  Cognitive:  Appropriate  Insight: Appropriate  Engagement in Group:  Engaged  Modes of Intervention:  Discussion  Additional Comments:    Rachel Everett 02/13/2024, 11:07 PM

## 2024-02-13 NOTE — Plan of Care (Signed)

## 2024-02-13 NOTE — Progress Notes (Signed)
 Pt is alert and oriented. Calm and cooperative. Flat affect. Pt found OOB in hallway on telephone. Pt endorses ongoing SI with plan to overdose on pill kept at home. Pt states I have a lot stored up at home and when she goes home she knows exactly what to do, referring to her plan to overdose. Pt reports wanting to remain in the hospital until she no longer experiences these thoughts. States she feels safe in the hospital and is able to contract for safety. Pt also expresses HI toward family members, stating thoughts of driving her car into them. Pt encouraged to inform staff of and problems and concerns or unsafe thoughts arise. Verbalized understanding. Education provided on Lamictal , including purpose and side effects. Q 15 min checks maintained for safety. No behavior issues noted. Attends group. Po med compliant. PRNs given for anxiety and insomnia. No c/o pain/discomfort noted.    02/12/24 2200  Psych Admission Type (Psych Patients Only)  Admission Status Voluntary  Psychosocial Assessment  Patient Complaints Anxiety;Depression  Eye Contact Fair  Facial Expression Sullen  Affect Anxious  Speech Logical/coherent  Interaction Cautious  Motor Activity Slow  Appearance/Hygiene In scrubs  Behavior Characteristics Anxious  Mood Pleasant  Thought Process  Coherency WDL  Content Blaming others  Delusions None reported or observed  Perception WDL  Hallucination None reported or observed  Judgment Impaired  Confusion None  Danger to Self  Current suicidal ideation? Passive  Agreement Not to Harm Self Yes  Description of Agreement verbal

## 2024-02-13 NOTE — Group Note (Signed)
 Date:  02/13/2024 Time:  11:29 AM  Group Topic/Focus:  Coping With Mental Health Crisis:   The purpose of this group is to help patients identify strategies for coping with mental health crisis.  Group discusses possible causes of crisis and ways to manage them effectively. Conflict Resolution:   The focus of this group is to discuss the conflict resolution process and how it may be used upon discharge. Healthy Communication:   The focus of this group is to discuss communication, barriers to communication, as well as healthy ways to communicate with others.    Participation Level:  Active  Participation Quality:  Appropriate  Affect:  Appropriate  Cognitive:  Appropriate  Insight: Appropriate  Engagement in Group:  Engaged  Modes of Intervention:  Activity and Discussion  Additional Comments:    Maryfer Tauzin L Francene Mcerlean 02/13/2024, 11:29 AM

## 2024-02-13 NOTE — Progress Notes (Signed)
 Patient is a voluntary admission to [facility] with bipolar disorder and c/o SI. During this shift patient expressed that she was worried about being suicidal and we came up with a safety plan that when she feels this way she can stay in the dayroom. Patient medicated for anxiety with atarax  and requested tylenol  for her chronic joint pain.  Patient interacts well with staff and peers, enjoying puzzles and coloring books. Will continue to monitor.

## 2024-02-14 MED ORDER — LAMOTRIGINE 25 MG PO TABS
50.0000 mg | ORAL_TABLET | Freq: Two times a day (BID) | ORAL | Status: DC
  Administered 2024-02-14 – 2024-02-18 (×8): 50 mg via ORAL
  Filled 2024-02-14 (×10): qty 2

## 2024-02-14 MED ORDER — OLANZAPINE 5 MG PO TABS
5.0000 mg | ORAL_TABLET | Freq: Every day | ORAL | Status: DC
  Administered 2024-02-14 – 2024-02-17 (×4): 5 mg via ORAL
  Filled 2024-02-14 (×4): qty 1

## 2024-02-14 MED ORDER — VENLAFAXINE HCL ER 75 MG PO CP24
75.0000 mg | ORAL_CAPSULE | Freq: Every day | ORAL | Status: DC
  Administered 2024-02-15: 75 mg via ORAL
  Filled 2024-02-14: qty 1

## 2024-02-14 NOTE — Progress Notes (Signed)
 Pt received in the dayroom interacting with peers, working on puzzle, and watching TV. Upon approach, pt stated she feeling better, though she endorses anxiety 8/10. Pt reports increased anxiety dt her daughter being hospitalized for detox and seizures, noting her daughter is autistic. Pt continues to have violent thoughts towards her mother, but other than that,I'm okay. Denies AVH. Denies thoughts of self-harm. Pt encouraged to verbalize feelings and utilize coping skills. Po med compliant. PRNs given for anxiety and insomnia.  Q15 min checks maintained for safety. No c/o pain/discomfort noted.    02/13/24 2200  Psych Admission Type (Psych Patients Only)  Admission Status Voluntary  Psychosocial Assessment  Patient Complaints Anxiety  Eye Contact Fair  Facial Expression Anxious  Affect Anxious  Speech Logical/coherent  Interaction Assertive  Motor Activity Slow  Appearance/Hygiene In scrubs  Behavior Characteristics Cooperative  Mood Pleasant  Thought Process  Coherency WDL  Content Blaming others  Delusions None reported or observed  Perception WDL  Hallucination None reported or observed  Judgment Impaired  Confusion None  Danger to Self  Current suicidal ideation? Denies  Danger to Others  Danger to Others Reported or observed (HI towards mother)

## 2024-02-14 NOTE — Group Note (Signed)
 LCSW Group Therapy Note  Group Date: 02/13/2024 Start Time: 1330 End Time: 1400   Type of Therapy and Topic:  Group Therapy - How To Cope with Nervousness about Discharge   Participation Level:  Minimal   Description of Group This process group involved identification of patients' feelings about discharge. Some of them are scheduled to be discharged soon, while others are new admissions, but each of them was asked to share thoughts and feelings surrounding discharge from the hospital. One common theme was that they are excited at the prospect of going home, while another was that many of them are apprehensive about sharing why they were hospitalized. Patients were given the opportunity to discuss these feelings with their peers in preparation for discharge.  Therapeutic Goals  Patient will identify their overall feelings about pending discharge. Patient will think about how they might proactively address issues that they believe will once again arise once they get home (i.e. with parents). Patients will participate in discussion about having hope for change.   Summary of Patient Progress:  Rachel Everett was very active throughout the session. Rachel Everett demonstrated fair insight into the subject matter, and proved open to input from peers and feedback from CSW. She was respectful of peers and participated throughout the entire session.   Therapeutic Modalities Cognitive Behavioral Therapy   Rachel Everett D Lecretia Buczek, LCSWA 02/14/2024  6:35 AM

## 2024-02-14 NOTE — Plan of Care (Signed)

## 2024-02-14 NOTE — Group Note (Signed)
 Date:  02/14/2024 Time:  10:53 AM  Group Topic/Focus:  Goals/Boundaries Group:   The focus of this group is to help patients establish daily goals to achieve during treatment and discuss how the patient can incorporate goal setting into their daily lives to aide in recovery. Patients also learned the different boundaries types and discussed which categories that applied to them.     Participation Level:  Active  Participation Quality:  Appropriate  Affect:  Appropriate  Cognitive:  Appropriate  Insight: Appropriate  Engagement in Group:  Engaged  Modes of Intervention:  Activity and Discussion  Additional Comments:    Beatris ONEIDA Hasten 02/14/2024, 10:53 AM

## 2024-02-14 NOTE — Group Note (Signed)
 Date:  02/14/2024 Time:  9:10 PM  Group Topic/Focus:  Wrap-Up Group:   The focus of this group is to help patients review their daily goal of treatment and discuss progress on daily workbooks.    Participation Level:  Active  Participation Quality:  Appropriate  Affect:  Appropriate  Cognitive:  Alert  Insight: Appropriate  Engagement in Group:  Engaged  Modes of Intervention:  Discussion  Additional Comments:    Rachel Everett CHRISTELLA Bunker 02/14/2024, 9:10 PM

## 2024-02-14 NOTE — Progress Notes (Signed)
 [facility] MD Progress Note  02/14/2024 7:21 PM Rachel Everett  MRN:  [id number] Patient is a 63 year old female admitted voluntarily to the [facility] Psych floor from [facility] with SI thoughts to overdose on pills, HI against brother and mother, complaints of worsening depression, anxiety, and emotional distress.Patient is admitted to [facility] unit with Q15 min safety monitoring. Multidisciplinary team approach is offered. Medication management; group/milieu therapy is offered.   Subjective:  Chart reviewed, case discussed in multidisciplinary meeting, patient seen during rounds.   Patient is noted to be sitting in the day area and doing the puzzles.  She reports ongoing anxiety, irritability and thoughts of hurting other people.  She denies SI.  She denies any problems with medications.  She is participating in groups.  She reports fair appetite and sleep.  She talks about her daughter having some medical problems last night and ending up in the emergency room.  She reports that her daughter is going to detox and is getting help.  Past Psychiatric History: see h&P Family History:  Family History  Problem Relation Age of Onset   Breast cancer Mother 33 - 79       recur 58s   Cancer Mother    Breast cancer Maternal Grandmother 56 - 50   Cancer Maternal Grandmother    Colon cancer Neg Hx    Esophageal cancer Neg Hx    Rectal cancer Neg Hx    Stomach cancer Neg Hx    BRCA 1/2 Neg Hx    Social History:  Social History   Substance and Sexual Activity  Alcohol Use Yes   Alcohol/week: 0.0 standard drinks of alcohol   Comment: rare     Social History   Substance and Sexual Activity  Drug Use No    Social History   Socioeconomic History   Marital status: Single    Spouse name: Not on file   Number of children: Not on file   Years of education: Not on file   Highest education level: Bachelor's degree (e.g., BA, AB, BS)  Occupational History   Not on file  Tobacco Use   Smoking status: Former     Current packs/day: 0.00    Average packs/day: 0.5 packs/day for 1 year (0.5 ttl pk-yrs)    Types: Cigarettes    Start date: 10/04/2014    Quit date: 10/04/2015    Years since quitting: 8.3   Smokeless tobacco: Never  Vaping Use   Vaping status: Former  Substance and Sexual Activity   Alcohol use: Yes    Alcohol/week: 0.0 standard drinks of alcohol    Comment: rare   Drug use: No   Sexual activity: Yes    Birth control/protection: Post-menopausal  Other Topics Concern   Not on file  Social History Narrative   Not on file   Social Drivers of Health   Financial Resource Strain: High Risk (06/26/2023)   Overall Financial Resource Strain (CARDIA)    Difficulty of Paying Living Expenses: Very hard  Food Insecurity: Food Insecurity Present (02/11/2024)   Hunger Vital Sign    Worried About Running Out of Food in the Last Year: Often true    Ran Out of Food in the Last Year: Often true  Transportation Needs: Unmet Transportation Needs (02/11/2024)   PRAPARE - Transportation    Lack of Transportation (Medical): Yes    Lack of Transportation (Non-Medical): Yes  Physical Activity: Inactive (06/26/2023)   Exercise Vital Sign    Days of Exercise  per Week: 0 days    Minutes of Exercise per Session: 0 min  Stress: Stress Concern Present (06/26/2023)   [employer] of Occupational Health - Occupational Stress Questionnaire    Feeling of Stress : Rather much  Social Connections: Socially Isolated (02/11/2024)   Social Connection and Isolation Panel    Frequency of Communication with Friends and Family: Never    Frequency of Social Gatherings with Friends and Family: Never    Attends Religious Services: Never    Database Administrator or Organizations: Yes    Attends Engineer, Structural: Never    Marital Status: Never married   Past Medical History:  Past Medical History:  Diagnosis Date   Anxiety    Bipolar 1 disorder (HCC)    Depression    GERD (gastroesophageal  reflux disease)    Hyperlipidemia    SVD (spontaneous vaginal delivery)    x 1   Thyroid  disease     Past Surgical History:  Procedure Laterality Date   ANKLE SURGERY     right    ANTERIOR CRUCIATE LIGAMENT REPAIR Left    BACK SURGERY     L5-S1   CHOLECYSTECTOMY     FRACTURE SURGERY Left    elbow   TENDON REPAIR Right    angle   TONSILLECTOMY      Current Medications: Current Facility-Administered Medications  Medication Dose Route Frequency Provider Last Rate Last Admin   acetaminophen  (TYLENOL ) tablet 650 mg  650 mg Oral Q6H PRN Levy, Jasmine A, NP   650 mg at 02/14/24 0816   alum & mag hydroxide-simeth (MAALOX/MYLANTA) 200-200-20 MG/5ML suspension 30 mL  30 mL Oral Q4H PRN Levy, Jasmine A, NP       atorvastatin  (LIPITOR) tablet 20 mg  20 mg Oral Daily Levy, Jasmine A, NP   20 mg at 02/14/24 0858   haloperidol  (HALDOL ) tablet 5 mg  5 mg Oral TID PRN Levy, Jasmine A, NP       And   diphenhydrAMINE  (BENADRYL ) capsule 50 mg  50 mg Oral TID PRN Levy, Jasmine A, NP       haloperidol  lactate (HALDOL ) injection 5 mg  5 mg Intramuscular TID PRN Levy, Jasmine A, NP       And   diphenhydrAMINE  (BENADRYL ) injection 50 mg  50 mg Intramuscular TID PRN Levy, Jasmine A, NP       And   LORazepam  (ATIVAN ) injection 2 mg  2 mg Intramuscular TID PRN Levy, Jasmine A, NP       haloperidol  lactate (HALDOL ) injection 10 mg  10 mg Intramuscular TID PRN Levy, Jasmine A, NP       And   diphenhydrAMINE  (BENADRYL ) injection 50 mg  50 mg Intramuscular TID PRN Levy, Jasmine A, NP       And   LORazepam  (ATIVAN ) injection 2 mg  2 mg Intramuscular TID PRN Levy, Jasmine A, NP       hydrOXYzine  (ATARAX ) tablet 25 mg  25 mg Oral Q6H PRN Shruti Arrey, MD   25 mg at 02/13/24 2131   lamoTRIgine  (LAMICTAL ) tablet 25 mg  25 mg Oral BID Emerald Shor, MD   25 mg at 02/14/24 0857   levothyroxine  (SYNTHROID ) tablet 100 mcg  100 mcg Oral Q0600 Levy, Jasmine A, NP   100 mcg at 02/14/24 0613   magnesium  hydroxide  (MILK OF MAGNESIA) suspension 30 mL  30 mL Oral Daily PRN Levy, Jasmine A, NP  OLANZapine (ZYPREXA) tablet 2.5 mg  2.5 mg Oral BID Briel Gallicchio, MD   2.5 mg at 02/14/24 0857   traZODone  (DESYREL ) tablet 50 mg  50 mg Oral QHS PRN Levy, Jasmine A, NP   50 mg at 02/13/24 2130   venlafaxine XR (EFFEXOR-XR) 24 hr capsule 37.5 mg  37.5 mg Oral Q breakfast Quindell Shere, MD   37.5 mg at 02/14/24 9183    Lab Results: No results found for this or any previous visit (from the past 48 hours).  Blood Alcohol level:  Lab Results  Component Value Date   [facility] <15 02/11/2024    Metabolic Disorder Labs: Lab Results  Component Value Date   HGBA1C 5.4 02/11/2024   MPG 108 02/11/2024   No results found for: PROLACTIN Lab Results  Component Value Date   CHOL 145 02/11/2024   TRIG 78 02/11/2024   HDL 59 02/11/2024   CHOLHDL 2.5 02/11/2024   VLDL 16 02/11/2024   LDLCALC 70 02/11/2024   LDLCALC 86 05/13/2023    Physical Findings: AIMS:  , ,  ,  ,    CIWA:    COWS:      Psychiatric Specialty Exam:  Presentation  General Appearance:  Appropriate for Environment  Eye Contact: Fleeting  Speech: Pressured  Speech Volume: Increased    Mood and Affect  Mood: Anxious; Depressed; Irritable  Affect: Labile; Depressed   Thought Process  Thought Processes: Irrevelant  Orientation:Full (Time, Place and Person)  Thought Content:Illogical  Hallucinations: Denies  Ideas of Reference:None  Suicidal Thoughts: Denies  Homicidal Thoughts: Passive HI towards brother and mother   Sensorium  Memory: Immediate Fair; Recent Fair  Judgment: Impaired  Insight: Shallow   Executive Functions  Concentration: Poor  Attention Span: Poor  Recall: [employer] of Knowledge: Fair  Language: Fair   Psychomotor Activity  Psychomotor Activity: No data recorded  Musculoskeletal: Strength & Muscle Tone: within normal limits Gait & Station: normal Assets   Assets: Manufacturing Systems Engineer; Social Support    Physical Exam: Physical Exam ROS Blood pressure 115/63, pulse 70, temperature 98.2 F (36.8 C), resp. rate 14, height 5' 6 (1.676 m), weight 55.1 kg, SpO2 99%. Body mass index is 19.61 kg/m.  Diagnosis: Principal Problem:   Bipolar disorder, current episode depressed, severe, with psychotic features [facility])   Clinical Decision Making: Patient currently admitted for worsening depression, suicidal ideation with plan to overdose on pills and HI towards family members in the context of property conflicts.  Patient will be monitored closely for safety  Treatment Plan Summary:  Safety and Monitoring:             -- Voluntary admission to inpatient psychiatric unit for safety, stabilization and treatment             -- Daily contact with patient to assess and evaluate symptoms and progress in treatment             -- Patient's case to be discussed in multi-disciplinary team meeting             -- Observation Level: q15 minute checks             -- Vital signs:  q12 hours             -- Precautions: suicide, elopement, and assault   2. Psychiatric Diagnoses and Treatment:               Increased Effexor XR 75 mg daily will be started for  depression and anxiety, Lamictal  25 mg twice daily was started to help with mood stabilization.  Hydroxyzine  as needed was increased to 50 mg every 6 hours to help with anxiety   -- The risks/benefits/side-effects/alternatives to this medication were discussed in detail with the patient and time was given for questions. The patient consents to medication trial.                -- Metabolic profile and EKG monitoring obtained while on an atypical antipsychotic (BMI: Lipid Panel: HbgA1c: QTc:)              -- Encouraged patient to participate in unit milieu and in scheduled group therapies                            3. Medical Issues Being Addressed:    4. Discharge Planning:   -- Social work and case  management to assist with discharge planning and identification of hospital follow-up needs prior to discharge  -- Estimated LOS: 3-4 days  Allyn Foil, MD 02/14/2024, 7:21 PM

## 2024-02-14 NOTE — Plan of Care (Signed)
   Problem: Education: Goal: Knowledge of the prescribed therapeutic regimen will improve Outcome: Progressing

## 2024-02-15 MED ORDER — VENLAFAXINE HCL ER 75 MG PO CP24
150.0000 mg | ORAL_CAPSULE | Freq: Every day | ORAL | Status: DC
  Administered 2024-02-16 – 2024-02-18 (×3): 150 mg via ORAL
  Filled 2024-02-15 (×3): qty 2

## 2024-02-15 NOTE — Plan of Care (Signed)
   Problem: Education: Goal: Ability to state activities that reduce stress will improve Outcome: Progressing   Problem: Coping: Goal: Ability to identify and develop effective coping behavior will improve Outcome: Progressing   Problem: Self-Concept: Goal: Ability to identify factors that promote anxiety will improve Outcome: Progressing

## 2024-02-15 NOTE — Plan of Care (Signed)
  Problem: Coping: Goal: Ability to identify and develop effective coping behavior will improve Outcome: Progressing   Problem: Self-Concept: Goal: Ability to identify factors that promote anxiety will improve Outcome: Not Progressing Goal: Ability to modify response to factors that promote anxiety will improve Outcome: Not Progressing

## 2024-02-15 NOTE — [facility] Suicide Risk Assessment (Signed)
 [facility] INPATIENT:  Family/Significant Other Suicide Prevention Education  Suicide Prevention Education:  Education Completed; Ludmilla Mcgillis, daughter, [phone],  (name of family member/significant other) has been identified by the patient as the family member/significant other with whom the patient will be residing, and identified as the person(s) who will aid the patient in the event of a mental health crisis (suicidal ideations/suicide attempt).  With written consent from the patient, the family member/significant other has been provided the following suicide prevention education, prior to the and/or following the discharge of the patient.  The suicide prevention education provided includes the following: Suicide risk factors Suicide prevention and interventions National Suicide Hotline telephone number [facility] assessment telephone number [facility] Emergency Assistance 911 [facility] and/or Residential Mobile Crisis Unit telephone number  Request made of family/significant other to: Remove weapons (e.g., guns, rifles, knives), all items previously/currently identified as safety concern.   Remove drugs/medications (over-the-counter, prescriptions, illicit drugs), all items previously/currently identified as a safety concern.  The family member/significant other verbalizes understanding of the suicide prevention education information provided.  The family member/significant other agrees to remove the items of safety concern listed above.  CSW spoke to pt's daughter [name]. [name] reports that pt's medication needs to be regulated, and that pt needs to talk through some issues and be diagnosed with autism.   CSW informed [name] that autism testing is done on an outpatient basis.   [name] reports pt has no access to guns or weapons but reports they do have kitchen knives.   [name] reports she would like to be informed when pt is ready for discharge and when her follow-up appointments  are so that [name] can assist with getting pt to her appointments.    Lum [name] 02/15/2024, 1:23 PM

## 2024-02-15 NOTE — Group Note (Signed)
 Date:  02/15/2024 Time:  4:25 PM  Group Topic/Focus:  Coping With Mental Health Crisis:   The purpose of this group is to help patients identify strategies for coping with mental health crisis.  Group discusses possible causes of crisis and ways to manage them effectively.    Participation Level:  Active  Participation Quality:  Appropriate  Affect:  Appropriate  Cognitive:  Appropriate  Insight: Appropriate  Engagement in Group:  Engaged  Modes of Intervention:  Activity  Additional Comments:    Rachel Everett 02/15/2024, 4:25 PM

## 2024-02-15 NOTE — Progress Notes (Signed)
   02/14/24 2151  Psych Admission Type (Psych Patients Only)  Admission Status Voluntary  Psychosocial Assessment  Patient Complaints None  Eye Contact Fair  Facial Expression Flat  Affect Flat  Speech Logical/coherent  Interaction Minimal  Motor Activity Slow  Appearance/Hygiene Unremarkable  Behavior Characteristics Appropriate to situation  Mood Pleasant  Thought Process  Coherency WDL  Content WDL  Delusions None reported or observed  Perception WDL  Hallucination None reported or observed  Judgment Impaired  Confusion None  Danger to Self  Current suicidal ideation? Denies  Danger to Others  Danger to Others None reported or observed

## 2024-02-15 NOTE — Group Note (Signed)
 Recreation Therapy Group Note   Group Topic:Other  Group Date: 02/15/2024 Start Time: 1400 End Time: 1445 Facilitators: Celestia Jeoffrey BRAVO, LRT, CTRS Location: Dayroom  Activity Description/Intervention: Therapeutic Drumming. Patients with peers and staff were given the opportunity to engage in a leader facilitated [facility] Group Empowerment Drumming Circle with staff from the [employer], in partnership with [employer]. Teaching laboratory technician and trained [place], Norleen Mon leading with LRT observing and documenting intervention and pt response. This evidenced-based practice targets 7 areas of health and wellbeing in the human experience including: stress-reduction, exercise, self-expression, camaraderie/support, nurturing, spirituality, and music-making (leisure).    Goal Area(s) Addresses:  Patient will engage in pro-social way in music group.  Patient will follow directions of drum leader on the first prompt. Patient will demonstrate no behavioral issues during group.  Patient will identify if a reduction in stress level occurs as a result of participation in therapeutic drum circle.   Affect/Mood: Appropriate   Participation Level: Active and Engaged   Participation Quality: Independent   Behavior: Calm and Cooperative   Speech/Thought Process: Coherent   Insight: Good   Judgement: Good   Modes of Intervention: Music   Patient Response to Interventions:  Attentive, Engaged, Interested , and Receptive   Education Outcome:  Acknowledges education   Clinical Observations/Individualized Feedback: Rachel Everett was active in their participation of session activities and group discussion. Pt interacted well with LRT and peers duration of session.    Plan: Continue to engage patient in RT group sessions 2-3x/week.   Jeoffrey BRAVO Celestia, LRT, CTRS 02/15/2024 4:37 PM

## 2024-02-15 NOTE — [facility] Group Notes (Signed)
 Date:  02/15/2024 Time:  6:03 PM  Group Topic/Focus:   Christmas coloring  Coloring Relieves Stress Coloring is a stimulating activity that provides many health benefits similar to meditation. It relaxes the mind and focuses attention on the present moment, which may help get rid of negative thoughts and ease the stress of daily life Coloring improves fine motor skills and hand-eye coordination, which are crucial for maintaining dexterity in older adults. Engaging in creative activities like coloring stimulates cognitive function, aiding in memory retention and problem-solving skills.  Participation Level:  Active  Participation Quality:  Appropriate  Affect:  Appropriate  Cognitive:  Appropriate  Insight: Appropriate  Engagement in Group:  Engaged  Modes of Intervention:  Activity  Additional Comments:  N/A  Rachel Everett Gavel 02/15/2024, 6:03 PM

## 2024-02-15 NOTE — Group Note (Signed)
 Date:  02/15/2024 Time:  9:48 PM  Group Topic/Focus:  Wrap-Up Group:   The focus of this group is to help patients review their daily goal of treatment and discuss progress on daily workbooks.    Participation Level:  Active  Participation Quality:  Appropriate  Affect:  Appropriate  Cognitive:  Alert  Insight: Appropriate  Engagement in Group:  Engaged  Modes of Intervention:  Discussion  Additional Comments:    Rachel Everett CHRISTELLA Bunker 02/15/2024, 9:48 PM

## 2024-02-15 NOTE — Progress Notes (Signed)
 Behavior: Patient tearful several times during this shift. Able to be redirected.    Psych assessment:  Denies SI/AVH.   Interaction / Group attendance:   Patient attending group. Became visible upset with holiday music. Reports 'it is a difficult time for me.'  Medication/ PRNs:  Acetaminophen  and Atarax  PRNs this shift.  Pain:  Acetaminophen  for knee pain with some effect for chronic pain  15 min checks in place for safety.   Ongoing monitoring continues.

## 2024-02-15 NOTE — Group Note (Deleted)
 Physical/Occupational Therapy Group Note  Group Topic: UE [place]x   Group Date: 02/15/2024 Start Time: 1300 End Time: 1330 Facilitators: [name] Josette DEL, PT   Group Description: Group discussed impact of chronic/acute pain on safety and independence with functional tasks and impact on mental health.  Identified and discussed any previously learned or implemented strategies used.  Discussed and reviewed cognitive behavioral pain coping strategies to address/improve overall management of pain. Discussed relaxation, distraction techniques, cognitive restructuring, activity pacing/energy conservation, environment/home safety modifications, and role of sleep and sleep hygiene. Allowed time for questions and further discussion.  Therapeutic Goal(s):  Identify and discuss previously utilized pain coping strategies and implications of pain on function/well-being Identify and discuss implementing new cognitive behavioral pain coping strategies into daily routines Demonstrate understanding and performance of learned cognitive behavioral pain coping strategies  Individual Participation:  Participation Level: {OT [facility] Participation Ozczo:73732}   Participation Quality: {OT [facility] Participation Quality:26268}   Behavior: {B[facility] OT Group Behavior:26269}   Speech/Thought Process: {B[facility] OT Speech/Thought Process:26270}   Affect/Mood: {OT [facility] Affect/Mood:26271}   Insight: {OT [facility] Insight:26272}   Judgement: {OT B[facility] Judgement:26272}   Modes of Intervention: {B[facility] MODES OF INTERVENTION:26273}  Patient Response to Interventions:  {B[facility] OT Patient Response to Interventions:26274}   Plan: Continue to engage patient in PT/OT groups 1 - 2x/week.  02/15/2024  Josette DEL [name], PT

## 2024-02-15 NOTE — Progress Notes (Signed)
 [facility] MD Progress Note  02/15/2024 9:17 PM Rachel Everett  MRN:  [id number] Patient is a 63 year old female admitted voluntarily to the [facility] Psych floor from [facility] with SI thoughts to overdose on pills, HI against brother and mother, complaints of worsening depression, anxiety, and emotional distress.Patient is admitted to [facility] unit with Q15 min safety monitoring. Multidisciplinary team approach is offered. Medication management; group/milieu therapy is offered.   Subjective:  Chart reviewed, case discussed in multidisciplinary meeting, patient seen during rounds.   Patient is noted to be sitting in the day area and doing the puzzles.  She reports ongoing anxiety, irritability and thoughts of hurting other people.  She denies SI.  She denies any problems with medications.  She is participating in groups.  She reports fair appetite and sleep.  She talks about her daughter having some medical problems last night and ending up in the emergency room.  She reports that her daughter is going to detox and is getting help.  Past Psychiatric History: see h&P Family History:  Family History  Problem Relation Age of Onset   Breast cancer Mother 36 - 24       recur 23s   Cancer Mother    Breast cancer Maternal Grandmother 37 - 41   Cancer Maternal Grandmother    Colon cancer Neg Hx    Esophageal cancer Neg Hx    Rectal cancer Neg Hx    Stomach cancer Neg Hx    BRCA 1/2 Neg Hx    Social History:  Social History   Substance and Sexual Activity  Alcohol Use Yes   Alcohol/week: 0.0 standard drinks of alcohol   Comment: rare     Social History   Substance and Sexual Activity  Drug Use No    Social History   Socioeconomic History   Marital status: Single    Spouse name: Not on file   Number of children: Not on file   Years of education: Not on file   Highest education level: Bachelor's degree (e.g., BA, AB, BS)  Occupational History   Not on file  Tobacco Use   Smoking status: Former     Current packs/day: 0.00    Average packs/day: 0.5 packs/day for 1 year (0.5 ttl pk-yrs)    Types: Cigarettes    Start date: 10/04/2014    Quit date: 10/04/2015    Years since quitting: 8.3   Smokeless tobacco: Never  Vaping Use   Vaping status: Former  Substance and Sexual Activity   Alcohol use: Yes    Alcohol/week: 0.0 standard drinks of alcohol    Comment: rare   Drug use: No   Sexual activity: Yes    Birth control/protection: Post-menopausal  Other Topics Concern   Not on file  Social History Narrative   Not on file   Social Drivers of Health   Financial Resource Strain: High Risk (06/26/2023)   Overall Financial Resource Strain (CARDIA)    Difficulty of Paying Living Expenses: Very hard  Food Insecurity: Food Insecurity Present (02/11/2024)   Hunger Vital Sign    Worried About Running Out of Food in the Last Year: Often true    Ran Out of Food in the Last Year: Often true  Transportation Needs: Unmet Transportation Needs (02/11/2024)   PRAPARE - Transportation    Lack of Transportation (Medical): Yes    Lack of Transportation (Non-Medical): Yes  Physical Activity: Inactive (06/26/2023)   Exercise Vital Sign    Days of Exercise  per Week: 0 days    Minutes of Exercise per Session: 0 min  Stress: Stress Concern Present (06/26/2023)   [employer] of Occupational Health - Occupational Stress Questionnaire    Feeling of Stress : Rather much  Social Connections: Socially Isolated (02/11/2024)   Social Connection and Isolation Panel    Frequency of Communication with Friends and Family: Never    Frequency of Social Gatherings with Friends and Family: Never    Attends Religious Services: Never    Database Administrator or Organizations: Yes    Attends Engineer, Structural: Never    Marital Status: Never married   Past Medical History:  Past Medical History:  Diagnosis Date   Anxiety    Bipolar 1 disorder (HCC)    Depression    GERD (gastroesophageal  reflux disease)    Hyperlipidemia    SVD (spontaneous vaginal delivery)    x 1   Thyroid  disease     Past Surgical History:  Procedure Laterality Date   ANKLE SURGERY     right    ANTERIOR CRUCIATE LIGAMENT REPAIR Left    BACK SURGERY     L5-S1   CHOLECYSTECTOMY     FRACTURE SURGERY Left    elbow   TENDON REPAIR Right    angle   TONSILLECTOMY      Current Medications: Current Facility-Administered Medications  Medication Dose Route Frequency Provider Last Rate Last Admin   acetaminophen  (TYLENOL ) tablet 650 mg  650 mg Oral Q6H PRN Levy, Jasmine A, NP   650 mg at 02/15/24 1104   alum & mag hydroxide-simeth (MAALOX/MYLANTA) 200-200-20 MG/5ML suspension 30 mL  30 mL Oral Q4H PRN Levy, Jasmine A, NP       atorvastatin  (LIPITOR) tablet 20 mg  20 mg Oral Daily Levy, Jasmine A, NP   20 mg at 02/15/24 9083   haloperidol  (HALDOL ) tablet 5 mg  5 mg Oral TID PRN Levy, Jasmine A, NP       And   diphenhydrAMINE  (BENADRYL ) capsule 50 mg  50 mg Oral TID PRN Levy, Jasmine A, NP       haloperidol  lactate (HALDOL ) injection 5 mg  5 mg Intramuscular TID PRN Levy, Jasmine A, NP       And   diphenhydrAMINE  (BENADRYL ) injection 50 mg  50 mg Intramuscular TID PRN Levy, Jasmine A, NP       And   LORazepam  (ATIVAN ) injection 2 mg  2 mg Intramuscular TID PRN Levy, Jasmine A, NP       haloperidol  lactate (HALDOL ) injection 10 mg  10 mg Intramuscular TID PRN Levy, Jasmine A, NP       And   diphenhydrAMINE  (BENADRYL ) injection 50 mg  50 mg Intramuscular TID PRN Levy, Jasmine A, NP       And   LORazepam  (ATIVAN ) injection 2 mg  2 mg Intramuscular TID PRN Levy, Jasmine A, NP       hydrOXYzine  (ATARAX ) tablet 25 mg  25 mg Oral Q6H PRN Abdulmalik Darco, MD   25 mg at 02/15/24 1104   lamoTRIgine  (LAMICTAL ) tablet 50 mg  50 mg Oral BID Siboney Requejo, MD   50 mg at 02/15/24 0916   levothyroxine  (SYNTHROID ) tablet 100 mcg  100 mcg Oral Q0600 Levy, Jasmine A, NP   100 mcg at 02/15/24 9392   magnesium  hydroxide  (MILK OF MAGNESIA) suspension 30 mL  30 mL Oral Daily PRN Levy, Jasmine A, NP  OLANZapine  (ZYPREXA ) tablet 2.5 mg  2.5 mg Oral BID Kysha Muralles, MD   2.5 mg at 02/15/24 9083   OLANZapine  (ZYPREXA ) tablet 5 mg  5 mg Oral QHS Cassey Bacigalupo, MD   5 mg at 02/14/24 2151   traZODone  (DESYREL ) tablet 50 mg  50 mg Oral QHS PRN Levy, Jasmine A, NP   50 mg at 02/14/24 2155   [START ON 02/16/2024] venlafaxine  XR (EFFEXOR -XR) 24 hr capsule 150 mg  150 mg Oral Q breakfast Josclyn Rosales, MD        Lab Results: No results found for this or any previous visit (from the past 48 hours).  Blood Alcohol level:  Lab Results  Component Value Date   [facility] <15 02/11/2024    Metabolic Disorder Labs: Lab Results  Component Value Date   HGBA1C 5.4 02/11/2024   MPG 108 02/11/2024   No results found for: PROLACTIN Lab Results  Component Value Date   CHOL 145 02/11/2024   TRIG 78 02/11/2024   HDL 59 02/11/2024   CHOLHDL 2.5 02/11/2024   VLDL 16 02/11/2024   LDLCALC 70 02/11/2024   LDLCALC 86 05/13/2023    Physical Findings: AIMS:  , ,  ,  ,    CIWA:    COWS:      Psychiatric Specialty Exam:  Presentation  General Appearance:  Appropriate for Environment  Eye Contact: Fleeting  Speech: Pressured  Speech Volume: Increased    Mood and Affect  Mood: anxious  Affect: stable   Thought Process  Thought Processes: linear  Orientation:Full (Time, Place and Person)  Thought Content:Illogical  Hallucinations: Denies  Ideas of Reference:None  Suicidal Thoughts: Denies  Homicidal Thoughts: denies   Sensorium  Memory: Immediate Fair; Recent Fair  Judgment: Impaired  Insight: Shallow   Executive Functions  Concentration: Poor  Attention Span: Poor  Recall: [employer] of Knowledge: Fair  Language: Fair   Psychomotor Activity  Psychomotor Activity: No data recorded  Musculoskeletal: Strength & Muscle Tone: within normal limits Gait &  Station: normal Assets  Assets: Manufacturing Systems Engineer; Social Support    Physical Exam: Physical Exam Vitals and nursing note reviewed.    ROS Blood pressure 121/70, pulse 87, temperature 98.2 F (36.8 C), resp. rate 16, height 5' 6 (1.676 m), weight 55.1 kg, SpO2 97%. Body mass index is 19.61 kg/m.  Diagnosis: Principal Problem:   Bipolar disorder, current episode depressed, severe, with psychotic features [facility])   Clinical Decision Making: Patient currently admitted for worsening depression, suicidal ideation with plan to overdose on pills and HI towards family members in the context of property conflicts.  Patient will be monitored closely for safety  Treatment Plan Summary:  Safety and Monitoring:             -- Voluntary admission to inpatient psychiatric unit for safety, stabilization and treatment             -- Daily contact with patient to assess and evaluate symptoms and progress in treatment             -- Patient's case to be discussed in multi-disciplinary team meeting             -- Observation Level: q15 minute checks             -- Vital signs:  q12 hours             -- Precautions: suicide, elopement, and assault   2. Psychiatric Diagnoses and Treatment:  Increased Effexor XR 150 mg daily  Zyprexa 2.5 mg QAM and 7.5 mg QHS Lamictal  25 mg twice daily was started to help with mood stabilization.  Hydroxyzine  as needed was increased to 50 mg every 6 hours to help with anxiety   -- The risks/benefits/side-effects/alternatives to this medication were discussed in detail with the patient and time was given for questions. The patient consents to medication trial.                -- Metabolic profile and EKG monitoring obtained while on an atypical antipsychotic (BMI: Lipid Panel: HbgA1c: QTc:)              -- Encouraged patient to participate in unit milieu and in scheduled group therapies                            3. Medical Issues Being Addressed:     4. Discharge Planning:   -- Social work and case management to assist with discharge planning and identification of hospital follow-up needs prior to discharge  -- Estimated LOS: 3-4 days  Allyn Foil, MD 02/15/2024, 9:17 PM

## 2024-02-15 NOTE — Group Note (Deleted)
 Date:  02/15/2024 Time:  5:55 PM  Group Topic/Focus:   Christmas coloring  Coloring Relieves Stress Coloring is a stimulating activity that provides many health benefits similar to meditation. It relaxes the mind and focuses attention on the present moment, which may help get rid of negative thoughts and ease the stress of daily life Coloring improves fine motor skills and hand-eye coordination, which are crucial for maintaining dexterity in older adults. Engaging in creative activities like coloring stimulates cognitive function, aiding in memory retention and problem-solving skills.  Participation Level:  Active  Participation Quality:  Did not participate  Affect:  Flat  Cognitive:  Alert  Insight: None  Engagement in Group:  None  Modes of Intervention:  Activity  Additional Comments:  N/A  Harlene LITTIE Gavel 02/15/2024, 5:55 PM

## 2024-02-16 ENCOUNTER — Ambulatory Visit: Admitting: Family Medicine

## 2024-02-16 NOTE — Plan of Care (Signed)
  Problem: Coping: Goal: Ability to identify and develop effective coping behavior will improve Outcome: Progressing   

## 2024-02-16 NOTE — Group Note (Signed)
 LCSW Group Therapy Note  Group Date: 02/16/2024 Start Time: 1315 End Time: 1345   Type of Therapy and Topic:  Group Therapy: Self-Harm Alternatives  Participation Level:  Minimal   Description of Group:   Patients participated in a discussion regarding non-suicidal self-injurious behavior (NSSIB, or self-harm) and the stigma surrounding it. There was also discussion surrounding how other maladaptive coping skills could be seen as self-harm, such as substance abuse. Participants were invited to share their experiences with self-harm, with emphasis being placed on the motivation for self-harm (such as release, punishment, feeling numb, etc). Patients were then asked to brainstorm potential substitutions for self-harm and were provided with a handout entitled, Distraction Techniques and Alternative Coping Strategies, published by [employer] Self-Injury Recovery.  Therapeutic Goals:  Patients will be given the opportunity to discuss NSSIB in a non-judgmental and therapeutic environment. Patients will identify which feelings lead to NSSIB.  Patients will discuss potential healthy coping skills to replace NSSIB Open discussion will specifically address stigma and shame surrounding NSSIB.   Summary of Patient Progress:  Iyania was both present/active throughout the session and proved open to feedback from CSW and peers. Patient demonstrated fair insight into the subject matter, was respectful of peers, and was present throughout the entire session.  Therapeutic Modalities:   Cognitive Behavioral Therapy   Lum [name], [place] 02/16/2024  2:17 PM

## 2024-02-16 NOTE — Group Note (Signed)
 Date:  02/16/2024 Time:  6:14 PM  Group Topic/Focus:  Coping With Mental Health Crisis:   The purpose of this group is to help patients identify strategies for coping with mental health crisis.  Group discusses possible causes of crisis and ways to manage them effectively.    Participation Level:  Active  Participation Quality:  Appropriate  Affect:  Appropriate  Cognitive:  Appropriate  Insight: Appropriate  Engagement in Group:  Engaged  Modes of Intervention:  Activity  Additional Comments:    Dennise Bamber 02/16/2024, 6:14 PM

## 2024-02-16 NOTE — Group Note (Signed)
 Date:  02/16/2024 Time:  6:23 PM  Group Topic/Focus:  Managing Feelings:   The focus of this group is to identify what feelings patients have difficulty handling and develop a plan to handle them in a healthier way upon discharge.    Participation Level:  Did Not Attend  Participation Quality:     Affect:     Cognitive:     Insight: None  Engagement in Group:     Modes of Intervention:     Additional Comments:    Caelen Higinbotham K 02/16/2024, 6:23 PM

## 2024-02-16 NOTE — Plan of Care (Signed)
?  Problem: Education: ?Goal: Ability to state activities that reduce stress will improve ?Outcome: Progressing ?  ?Problem: Self-Concept: ?Goal: Level of anxiety will decrease ?Outcome: Progressing ?  ?

## 2024-02-16 NOTE — Group Note (Signed)
 Date:  02/16/2024 Time:  9:18 PM  Group Topic/Focus:  Wrap-Up Group:   The focus of this group is to help patients review their daily goal of treatment and discuss progress on daily workbooks.    Participation Level:  Active  Participation Quality:  Appropriate  Affect:  Appropriate  Cognitive:  Alert  Insight: Appropriate  Engagement in Group:  Engaged  Modes of Intervention:  Discussion  Additional Comments:    Rachel Everett CHRISTELLA Bunker 02/16/2024, 9:18 PM

## 2024-02-16 NOTE — Progress Notes (Signed)
   02/15/24 2230  Psych Admission Type (Psych Patients Only)  Admission Status Voluntary  Psychosocial Assessment  Patient Complaints Anxiety  Eye Contact Brief  Facial Expression Flat  Affect Flat  Speech Logical/coherent  Interaction Assertive  Motor Activity Slow  Appearance/Hygiene In scrubs  Behavior Characteristics Appropriate to situation  Mood Anxious;Pleasant  Thought Process  Coherency WDL  Content WDL  Delusions None reported or observed  Perception WDL  Hallucination None reported or observed  Judgment Impaired  Confusion None  Danger to Self  Current suicidal ideation? Denies  Self-Injurious Behavior No self-injurious ideation or behavior indicators observed or expressed   Agreement Not to Harm Self Yes  Description of Agreement Verbal  Danger to Others  Danger to Others None reported or observed  Danger to Others Abnormal  Harmful Behavior to others No threats or harm toward other people
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
## 2024-02-16 NOTE — Group Note (Signed)
 Recreation Therapy Group Note   Group Topic:Relaxation  Group Date: 02/16/2024 Start Time: 1400 End Time: 1440 Facilitators: Celestia Jeoffrey BRAVO, LRT Location: Dayroom  Group Description: PMR (Progressive Muscle Relaxation). LRT educates patients on what PMR is and the benefits that come from it. Patients are asked to sit with their feet flat on the floor while sitting up and all the way back in their chair, if possible. LRT and pts follow a prompt through a speaker that requires you to tense and release different muscles in their body and focus on their breathing. During session, lights are off and soft music is being played. Pts are given a stress ball to use if needed.   Goal Area(s) Addressed:  Patients will be able to describe progressive muscle relaxation.  Patient will practice using relaxation technique. Patient will identify a new coping skill.  Patient will follow multistep directions to reduce anxiety and stress.   Affect/Mood: Appropriate   Participation Level: Active and Engaged   Participation Quality: Independent   Behavior: Appropriate, Calm, and Cooperative   Speech/Thought Process: Coherent   Insight: Good   Judgement: Good   Modes of Intervention: Activity, Education, and Exploration   Patient Response to Interventions:  Attentive, Engaged, and Receptive   Education Outcome:  Acknowledges education   Clinical Observations/Individualized Feedback: Rachel Everett was active in their participation of session activities and group discussion. Pt completed all exercises as prompted. Pt received a educational handout afterwards.    Plan: Continue to engage patient in RT group sessions 2-3x/week.   Jeoffrey BRAVO Celestia, LRT, CTRS 02/16/2024 4:48 PM

## 2024-02-16 NOTE — Progress Notes (Signed)
 Behavior: Patient quiet but responsive during interaction. Able to make needs known.  Psych assessment:  Denies SI/HI/AVH. No episodes of tearfulness this shift. Did report anxiety x1. Interaction / Group attendance:   Patient participated in several group activities and had meals in day room.  Medication/ PRNs:  PRNs X1 for anxiety and X1 for pain this shift  Pain:  X1 for generalized pain   15 min checks in place for safety.   Ongoing monitoring continues

## 2024-02-17 MED ORDER — ACETAMINOPHEN 500 MG PO TABS: 1000.0000 mg | ORAL_TABLET | Freq: Four times a day (QID) | ORAL | Status: DC | PRN

## 2024-02-17 MED ORDER — NAPROXEN 250 MG PO TABS
375.0000 mg | ORAL_TABLET | Freq: Two times a day (BID) | ORAL | Status: DC
  Administered 2024-02-17 – 2024-02-18 (×3): 375 mg via ORAL
  Filled 2024-02-17 (×3): qty 2

## 2024-02-17 NOTE — Progress Notes (Signed)
   02/17/24 2200  Psych Admission Type (Psych Patients Only)  Admission Status Voluntary  Psychosocial Assessment  Patient Complaints None  Eye Contact Brief  Facial Expression Flat  Affect Appropriate to circumstance  Speech Argumentative  Interaction Assertive  Motor Activity Slow  Appearance/Hygiene In scrubs  Mood Pleasant;Anxious  Thought Process  Coherency WDL  Content WDL  Delusions None reported or observed  Perception WDL  Hallucination None reported or observed  Judgment Impaired  Confusion None  Danger to Self  Current suicidal ideation? Denies  Agreement Not to Harm Self Yes  Description of Agreement Verbal  Danger to Others  Danger to Others None reported or observed  Danger to Others Abnormal  Harmful Behavior to others No threats or harm toward other people

## 2024-02-17 NOTE — Plan of Care (Signed)
   Problem: Education: Goal: Ability to state activities that reduce stress will improve Outcome: Progressing   Problem: Coping: Goal: Ability to identify and develop effective coping behavior will improve Outcome: Progressing

## 2024-02-17 NOTE — Progress Notes (Signed)
 [facility] MD Progress Note  02/17/2024 4:04 PM Rachel Everett  MRN:  [id number] Patient is a 63 year old female admitted voluntarily to the [facility] Psych floor from [facility] with SI thoughts to overdose on pills, HI against brother and mother, complaints of worsening depression, anxiety, and emotional distress.Patient is admitted to [facility] unit with Q15 min safety monitoring. Multidisciplinary team approach is offered. Medication management; group/milieu therapy is offered.   Subjective:  Chart reviewed, case discussed in multidisciplinary meeting, patient seen during rounds.  Patient is noted to be sitting in her room.  She reports participating in groups and having no problems on the unit.  She remains anxious about her discharge but after discussion about transitioning her to partial hospitalization program patient seems to be much relieved.  She did acknowledge needing a good team to support her in the community.  She does want neuropsychological testing for her autism diagnosis.  She remains future oriented as she wants to stay stable and work on acquiring her inheritance from her family.  She is not endorsing active SI or HI today.  She is not responding to internal stimuli.  She is taking her medicine with no reported side effects. Past Psychiatric History: see h&P Family History:  Family History  Problem Relation Age of Onset   Breast cancer Mother 34 - 68       recur 68s   Cancer Mother    Breast cancer Maternal Grandmother 64 - 4   Cancer Maternal Grandmother    Colon cancer Neg Hx    Esophageal cancer Neg Hx    Rectal cancer Neg Hx    Stomach cancer Neg Hx    BRCA 1/2 Neg Hx    Social History:  Social History   Substance and Sexual Activity  Alcohol Use Yes   Alcohol/week: 0.0 standard drinks of alcohol   Comment: rare     Social History   Substance and Sexual Activity  Drug Use No    Social History   Socioeconomic History   Marital status: Single    Spouse name: Not on file    Number of children: Not on file   Years of education: Not on file   Highest education level: Bachelor's degree (e.g., BA, AB, BS)  Occupational History   Not on file  Tobacco Use   Smoking status: Former    Current packs/day: 0.00    Average packs/day: 0.5 packs/day for 1 year (0.5 ttl pk-yrs)    Types: Cigarettes    Start date: 10/04/2014    Quit date: 10/04/2015    Years since quitting: 8.3   Smokeless tobacco: Never  Vaping Use   Vaping status: Former  Substance and Sexual Activity   Alcohol use: Yes    Alcohol/week: 0.0 standard drinks of alcohol    Comment: rare   Drug use: No   Sexual activity: Yes    Birth control/protection: Post-menopausal  Other Topics Concern   Not on file  Social History Narrative   Not on file   Social Drivers of Health   Financial Resource Strain: High Risk (06/26/2023)   Overall Financial Resource Strain (CARDIA)    Difficulty of Paying Living Expenses: Very hard  Food Insecurity: Food Insecurity Present (02/11/2024)   Hunger Vital Sign    Worried About Running Out of Food in the Last Year: Often true    Ran Out of Food in the Last Year: Often true  Transportation Needs: Unmet Transportation Needs (02/11/2024)   PRAPARE -  Administrator, Civil Service (Medical): Yes    Lack of Transportation (Non-Medical): Yes  Physical Activity: Inactive (06/26/2023)   Exercise Vital Sign    Days of Exercise per Week: 0 days    Minutes of Exercise per Session: 0 min  Stress: Stress Concern Present (06/26/2023)   [employer] of Occupational Health - Occupational Stress Questionnaire    Feeling of Stress : Rather much  Social Connections: Socially Isolated (02/11/2024)   Social Connection and Isolation Panel    Frequency of Communication with Friends and Family: Never    Frequency of Social Gatherings with Friends and Family: Never    Attends Religious Services: Never    Database Administrator or Organizations: Yes    Attends Museum/gallery Exhibitions Officer: Never    Marital Status: Never married   Past Medical History:  Past Medical History:  Diagnosis Date   Anxiety    Bipolar 1 disorder (HCC)    Depression    GERD (gastroesophageal reflux disease)    Hyperlipidemia    SVD (spontaneous vaginal delivery)    x 1   Thyroid  disease     Past Surgical History:  Procedure Laterality Date   ANKLE SURGERY     right    ANTERIOR CRUCIATE LIGAMENT REPAIR Left    BACK SURGERY     L5-S1   CHOLECYSTECTOMY     FRACTURE SURGERY Left    elbow   TENDON REPAIR Right    angle   TONSILLECTOMY      Current Medications: Current Facility-Administered Medications  Medication Dose Route Frequency Provider Last Rate Last Admin   acetaminophen  (TYLENOL ) tablet 1,000 mg  1,000 mg Oral Q6H PRN Donnelly Mellow, MD       alum & mag hydroxide-simeth (MAALOX/MYLANTA) 200-200-20 MG/5ML suspension 30 mL  30 mL Oral Q4H PRN Levy, Jasmine A, NP       atorvastatin  (LIPITOR) tablet 20 mg  20 mg Oral Daily Levy, Jasmine A, NP   20 mg at 02/17/24 9041   haloperidol  (HALDOL ) tablet 5 mg  5 mg Oral TID PRN Levy, Jasmine A, NP       And   diphenhydrAMINE  (BENADRYL ) capsule 50 mg  50 mg Oral TID PRN Levy, Jasmine A, NP       haloperidol  lactate (HALDOL ) injection 5 mg  5 mg Intramuscular TID PRN Levy, Jasmine A, NP       And   diphenhydrAMINE  (BENADRYL ) injection 50 mg  50 mg Intramuscular TID PRN Levy, Jasmine A, NP       And   LORazepam  (ATIVAN ) injection 2 mg  2 mg Intramuscular TID PRN Levy, Jasmine A, NP       haloperidol  lactate (HALDOL ) injection 10 mg  10 mg Intramuscular TID PRN Levy, Jasmine A, NP       And   diphenhydrAMINE  (BENADRYL ) injection 50 mg  50 mg Intramuscular TID PRN Levy, Jasmine A, NP       And   LORazepam  (ATIVAN ) injection 2 mg  2 mg Intramuscular TID PRN Levy, Jasmine A, NP       hydrOXYzine  (ATARAX ) tablet 25 mg  25 mg Oral Q6H PRN Davy Westmoreland, MD   25 mg at 02/16/24 2150   lamoTRIgine  (LAMICTAL ) tablet 50 mg   50 mg Oral BID Colin Ellers, MD   50 mg at 02/17/24 0958   levothyroxine  (SYNTHROID ) tablet 100 mcg  100 mcg Oral Q0600 Levy, Jasmine A, NP   100 mcg  at 02/17/24 9390   magnesium  hydroxide (MILK OF MAGNESIA) suspension 30 mL  30 mL Oral Daily PRN Levy, Jasmine A, NP       naproxen  (NAPROSYN ) tablet 375 mg  375 mg Oral BID WC Boen Sterbenz, MD       OLANZapine (ZYPREXA) tablet 2.5 mg  2.5 mg Oral BID Aleczander Fandino, MD   2.5 mg at 02/17/24 0957   OLANZapine (ZYPREXA) tablet 5 mg  5 mg Oral QHS Reginia Battie, MD   5 mg at 02/16/24 2148   traZODone  (DESYREL ) tablet 50 mg  50 mg Oral QHS PRN Levy, Jasmine A, NP   50 mg at 02/16/24 2148   venlafaxine XR (EFFEXOR-XR) 24 hr capsule 150 mg  150 mg Oral Q breakfast Jhony Antrim, MD   150 mg at 02/17/24 9195    Lab Results: No results found for this or any previous visit (from the past 48 hours).  Blood Alcohol level:  Lab Results  Component Value Date   [facility] <15 02/11/2024    Metabolic Disorder Labs: Lab Results  Component Value Date   HGBA1C 5.4 02/11/2024   MPG 108 02/11/2024   No results found for: PROLACTIN Lab Results  Component Value Date   CHOL 145 02/11/2024   TRIG 78 02/11/2024   HDL 59 02/11/2024   CHOLHDL 2.5 02/11/2024   VLDL 16 02/11/2024   LDLCALC 70 02/11/2024   LDLCALC 86 05/13/2023    Physical Findings: AIMS:  , ,  ,  ,    CIWA:    COWS:      Psychiatric Specialty Exam:  Presentation  General Appearance:  Appropriate for Environment  Eye Contact: Stable  Speech: Normal  Speech Volume: Normal    Mood and Affect  Mood: anxious  Affect: stable   Thought Process  Thought Processes: linear  Orientation:Full (Time, Place and Person)  Thought Content: Linear Hallucinations: Denies  Ideas of Reference:None  Suicidal Thoughts: Denies  Homicidal Thoughts: denies   Sensorium  Memory: Immediate Fair; Recent Fair  Judgment: Impaired  Insight: Shallow   Executive  Functions  Concentration: Poor  Attention Span: Poor  Recall: [employer] of Knowledge: Fair  Language: Fair   Psychomotor Activity  Psychomotor Activity: No data recorded  Musculoskeletal: Strength & Muscle Tone: within normal limits Gait & Station: normal Assets  Assets: Manufacturing Systems Engineer; Social Support    Physical Exam: Physical Exam Vitals and nursing note reviewed.    ROS Blood pressure 107/75, pulse 94, temperature 97.6 F (36.4 C), resp. rate 16, height 5' 6 (1.676 m), weight 55.1 kg, SpO2 99%. Body mass index is 19.61 kg/m.  Diagnosis: Principal Problem:   Bipolar disorder, current episode depressed, severe, with psychotic features [facility])   Clinical Decision Making: Patient currently admitted for worsening depression, suicidal ideation with plan to overdose on pills and HI towards family members in the context of property conflicts.  Patient will be monitored closely for safety  Treatment Plan Summary:  Safety and Monitoring:             -- Voluntary admission to inpatient psychiatric unit for safety, stabilization and treatment             -- Daily contact with patient to assess and evaluate symptoms and progress in treatment             -- Patient's case to be discussed in multi-disciplinary team meeting             -- Observation  Level: q15 minute checks             -- Vital signs:  q12 hours             -- Precautions: suicide, elopement, and assault   2. Psychiatric Diagnoses and Treatment:               Increased Effexor XR 150 mg daily  Zyprexa 2.5 mg QAM and 7.5 mg QHS Lamictal  25 mg twice daily was started to help with mood stabilization.  Hydroxyzine  as needed was increased to 50 mg every 6 hours to help with anxiety   -- The risks/benefits/side-effects/alternatives to this medication were discussed in detail with the patient and time was given for questions. The patient consents to medication trial.                -- Metabolic  profile and EKG monitoring obtained while on an atypical antipsychotic (BMI: Lipid Panel: HbgA1c: QTc:)              -- Encouraged patient to participate in unit milieu and in scheduled group therapies                            3. Medical Issues Being Addressed:    4. Discharge Planning:   -- Social work and case management to assist with discharge planning and identification of hospital follow-up needs prior to discharge  -- Estimated LOS: 3-4 days  Sohaib Vereen, MD 02/17/2024, 4:04 PM

## 2024-02-17 NOTE — Group Note (Signed)
 Date:  02/17/2024 Time:  8:39 PM  Group Topic/Focus:  Wrap-Up Group:   The focus of this group is to help patients review their daily goal of treatment and discuss progress on daily workbooks.    Participation Level:  Active  Participation Quality:  Appropriate  Affect:  Appropriate  Cognitive:  Alert  Insight: Appropriate  Engagement in Group:  Engaged  Modes of Intervention:  Discussion  Additional Comments:    Sherrilyn JAYSON Redman 02/17/2024, 8:39 PM

## 2024-02-17 NOTE — Group Note (Signed)
 Date:  02/17/2024 Time:  11:21 AM  Group Topic/Focus:  Personal Choices and Values:   The focus of this group is to help patients assess and explore the importance of values in their lives, how their values affect their decisions, how they express their values and what opposes their expression.    Participation Level:  Active  Participation Quality:  Appropriate  Affect:  Appropriate  Cognitive:  Appropriate  Insight: Appropriate  Engagement in Group:  Engaged  Modes of Intervention:  Discussion  Additional Comments:  N/A  Harlene LITTIE Gavel 02/17/2024, 11:21 AM

## 2024-02-17 NOTE — BH IP Treatment Plan (Signed)
 Interdisciplinary Treatment and Diagnostic Plan Update  02/17/2024 Time of Session: 3:00 PM  Rachel Everett MRN: [id number]  Principal Diagnosis: Bipolar disorder, current episode depressed, severe, with psychotic features (HCC)  Secondary Diagnoses: Principal Problem:   Bipolar disorder, current episode depressed, severe, with psychotic features (HCC)   Current Medications:  Current Facility-Administered Medications  Medication Dose Route Frequency Provider Last Rate Last Admin   acetaminophen  (TYLENOL ) tablet 1,000 mg  1,000 mg Oral Q6H PRN Donnelly Mellow, MD       alum & mag hydroxide-simeth (MAALOX/MYLANTA) 200-200-20 MG/5ML suspension 30 mL  30 mL Oral Q4H PRN Levy, Jasmine A, NP       atorvastatin  (LIPITOR) tablet 20 mg  20 mg Oral Daily Levy, Jasmine A, NP   20 mg at 02/17/24 9041   haloperidol  (HALDOL ) tablet 5 mg  5 mg Oral TID PRN Levy, Jasmine A, NP       And   diphenhydrAMINE  (BENADRYL ) capsule 50 mg  50 mg Oral TID PRN Levy, Jasmine A, NP       haloperidol  lactate (HALDOL ) injection 5 mg  5 mg Intramuscular TID PRN Levy, Jasmine A, NP       And   diphenhydrAMINE  (BENADRYL ) injection 50 mg  50 mg Intramuscular TID PRN Levy, Jasmine A, NP       And   LORazepam  (ATIVAN ) injection 2 mg  2 mg Intramuscular TID PRN Levy, Jasmine A, NP       haloperidol  lactate (HALDOL ) injection 10 mg  10 mg Intramuscular TID PRN Levy, Jasmine A, NP       And   diphenhydrAMINE  (BENADRYL ) injection 50 mg  50 mg Intramuscular TID PRN Levy, Jasmine A, NP       And   LORazepam  (ATIVAN ) injection 2 mg  2 mg Intramuscular TID PRN Levy, Jasmine A, NP       hydrOXYzine  (ATARAX ) tablet 25 mg  25 mg Oral Q6H PRN Jadapalle, Sree, MD   25 mg at 02/16/24 2150   lamoTRIgine  (LAMICTAL ) tablet 50 mg  50 mg Oral BID Jadapalle, Sree, MD   50 mg at 02/17/24 9041   levothyroxine  (SYNTHROID ) tablet 100 mcg  100 mcg Oral Q0600 Levy, Jasmine A, NP   100 mcg at 02/17/24 9390   magnesium  hydroxide (MILK OF MAGNESIA)  suspension 30 mL  30 mL Oral Daily PRN Levy, Jasmine A, NP       naproxen  (NAPROSYN ) tablet 375 mg  375 mg Oral BID WC Jadapalle, Sree, MD   375 mg at 02/17/24 1628   OLANZapine (ZYPREXA) tablet 2.5 mg  2.5 mg Oral BID Jadapalle, Sree, MD   2.5 mg at 02/17/24 0957   OLANZapine (ZYPREXA) tablet 5 mg  5 mg Oral QHS Jadapalle, Sree, MD   5 mg at 02/16/24 2148   traZODone  (DESYREL ) tablet 50 mg  50 mg Oral QHS PRN Levy, Jasmine A, NP   50 mg at 02/16/24 2148   venlafaxine XR (EFFEXOR-XR) 24 hr capsule 150 mg  150 mg Oral Q breakfast Jadapalle, Sree, MD   150 mg at 02/17/24 9195   PTA Medications: Medications Prior to Admission  Medication Sig Dispense Refill Last Dose/Taking   atorvastatin  (LIPITOR) 20 MG tablet Take 1 tablet (20 mg total) by mouth daily. 90 tablet 1    levothyroxine  (SYNTHROID ) 100 MCG tablet Take 100 mcg by mouth daily.       Patient Stressors: Health problems   Marital or family conflict    Patient Strengths: Ability  for insight  Communication skills   Treatment Modalities: Medication Management, Group therapy, Case management,  1 to 1 session with clinician, Psychoeducation, Recreational therapy.   Physician Treatment Plan for Primary Diagnosis: Bipolar disorder, current episode depressed, severe, with psychotic features (HCC) Long Term Goal(s): Improvement in symptoms so as ready for discharge   Short Term Goals: Ability to identify changes in lifestyle to reduce recurrence of condition will improve Ability to verbalize feelings will improve Ability to disclose and discuss suicidal ideas Ability to demonstrate self-control will improve Ability to identify and develop effective coping behaviors will improve Ability to maintain clinical measurements within normal limits will improve  Medication Management: Evaluate patient's response, side effects, and tolerance of medication regimen.  Therapeutic Interventions: 1 to 1 sessions, Unit Group sessions and Medication  administration.  Evaluation of Outcomes: Progressing  Physician Treatment Plan for Secondary Diagnosis: Principal Problem:   Bipolar disorder, current episode depressed, severe, with psychotic features (HCC)  Long Term Goal(s): Improvement in symptoms so as ready for discharge   Short Term Goals: Ability to identify changes in lifestyle to reduce recurrence of condition will improve Ability to verbalize feelings will improve Ability to disclose and discuss suicidal ideas Ability to demonstrate self-control will improve Ability to identify and develop effective coping behaviors will improve Ability to maintain clinical measurements within normal limits will improve     Medication Management: Evaluate patient's response, side effects, and tolerance of medication regimen.  Therapeutic Interventions: 1 to 1 sessions, Unit Group sessions and Medication administration.  Evaluation of Outcomes: Progressing   RN Treatment Plan for Primary Diagnosis: Bipolar disorder, current episode depressed, severe, with psychotic features (HCC) Long Term Goal(s): Knowledge of disease and therapeutic regimen to maintain health will improve  Short Term Goals: Ability to remain free from injury will improve, Ability to verbalize frustration and anger appropriately will improve, Ability to demonstrate self-control, Ability to participate in decision making will improve, Ability to verbalize feelings will improve, Ability to disclose and discuss suicidal ideas, Ability to identify and develop effective coping behaviors will improve, and Compliance with prescribed medications will improve  Medication Management: RN will administer medications as ordered by provider, will assess and evaluate patient's response and provide education to patient for prescribed medication. RN will report any adverse and/or side effects to prescribing provider.  Therapeutic Interventions: 1 on 1 counseling sessions, Psychoeducation,  Medication administration, Evaluate responses to treatment, Monitor vital signs and CBGs as ordered, Perform/monitor CIWA, COWS, AIMS and Fall Risk screenings as ordered, Perform wound care treatments as ordered.  Evaluation of Outcomes: Progressing   LCSW Treatment Plan for Primary Diagnosis: Bipolar disorder, current episode depressed, severe, with psychotic features (HCC) Long Term Goal(s): Safe transition to appropriate next level of care at discharge, Engage patient in therapeutic group addressing interpersonal concerns.  Short Term Goals: Engage patient in aftercare planning with referrals and resources, Increase social support, Increase ability to appropriately verbalize feelings, Increase emotional regulation, Facilitate acceptance of mental health diagnosis and concerns, Facilitate patient progression through stages of change regarding substance use diagnoses and concerns, Identify triggers associated with mental health/substance abuse issues, and Increase skills for wellness and recovery  Therapeutic Interventions: Assess for all discharge needs, 1 to 1 time with Social worker, Explore available resources and support systems, Assess for adequacy in community support network, Educate family and significant other(s) on suicide prevention, Complete Psychosocial Assessment, Interpersonal group therapy.  Evaluation of Outcomes: Progressing   Progress in Treatment: Attending groups: Yes. and No. Participating  in groups: Yes. and No. Taking medication as prescribed: Yes. Toleration medication: Yes. Family/Significant other contact made: No, will contact:  CSW will contact if given permission  Patient understands diagnosis: Yes. Discussing patient identified problems/goals with staff: Yes. Medical problems stabilized or resolved: Yes. Denies suicidal/homicidal ideation: Yes. Issues/concerns per patient self-inventory: No. Other: None    New problem(s) identified: No, Describe:  None  identified  Update 02/17/24: No changes at this time    New Short Term/Long Term Goal(s): elimination of symptoms of psychosis, medication management for mood stabilization; elimination of SI thoughts; development of comprehensive mental wellness plan. Update 02/17/24: No changes at this time    Patient Goals:  I want to be able to function, I can't function Update 02/17/24: No changes at this time    Discharge Plan or Barriers: CSW will assist with appropriate discharge planning Update 02/17/24: No changes at this time    Reason for Continuation of Hospitalization: Anxiety Medication stabilization   Estimated Length of Stay: 1 to 7 days Update 02/17/24: TBD  Last 3 [place] Suicide Severity Risk Score: Flowsheet Row Admission (Current) from 02/11/2024 in [facility] Most recent reading at 02/11/2024  6:00 PM ED from 02/11/2024 in [facility] Most recent reading at 02/11/2024 11:21 AM ED to Hosp-Admission (Discharged) from 12/20/2022 in [facility] Most recent reading at 12/20/2022  7:03 AM  C-SSRS RISK CATEGORY High Risk High Risk No Risk    Last PHQ 2/9 Scores:    05/13/2023   11:30 AM 01/21/2021   11:31 AM 08/28/2017    4:31 PM  Depression screen PHQ 2/9  Decreased Interest 1 1 1   Down, Depressed, Hopeless 1 1 1   PHQ - 2 Score 2 2 2   Altered sleeping 2 2 3   Tired, decreased energy 3 1 3   Change in appetite 3 1 2   Feeling bad or failure about yourself  3 2 1   Trouble concentrating 3 0 2  Moving slowly or fidgety/restless 2 0 1  Suicidal thoughts 0 0 0  PHQ-9 Score 18  8  14    Difficult doing work/chores Very difficult Not difficult at all      Data saved with a previous flowsheet row definition    Scribe for Treatment Team: Lum [name], ISRAEL 02/17/2024 4:36 PM

## 2024-02-17 NOTE — Progress Notes (Signed)
 Pt received in the dayroom engaged in puzzle activities and watching TV. Pt alert and oriented with flat affect. Pt endorses anxiety 7/10 and depression level 3/10.  reports passive SI and HI towards family members, without intent or plan. Pt is able to contract for safety.  Pt states feeling safe in the hospital and reports that current medications regime is helping. Interacting with peers and staff. Attends group. Po med compliant. PRNs given for insomnia and anxiety. Q 15 min checks maintained for safety. No behavior issues noted. Denies AVH. No c/o pian/discomfort noted. Slept 9 hours.   02/16/24 2100  Psych Admission Type (Psych Patients Only)  Admission Status Voluntary  Psychosocial Assessment  Patient Complaints Anxiety  Eye Contact Fair  Facial Expression Flat  Affect Appropriate to circumstance  Speech Logical/coherent  Interaction Assertive  Motor Activity Slow  Appearance/Hygiene In scrubs  Behavior Characteristics Cooperative  Mood Pleasant  Thought Process  Coherency WDL  Content WDL  Delusions None reported or observed  Perception WDL  Hallucination None reported or observed  Judgment Impaired  Confusion None  Danger to Self  Current suicidal ideation? Passive  Agreement Not to Harm Self Yes  Description of Agreement verbal

## 2024-02-17 NOTE — Progress Notes (Signed)
 Patient without any changes from earlier assessment. Still has not had a BM, refuses medications but cooperative with taking prune juice. Pt. Without HI/SI calm and cooperative.

## 2024-02-17 NOTE — Group Note (Signed)
 Date:  02/17/2024 Time:  3:26 PM  Group Topic/Focus:  Movement therapy    Participation Level:  Did Not Attend   Norleen SHAUNNA Bias 02/17/2024, 3:26 PM

## 2024-02-18 ENCOUNTER — Other Ambulatory Visit: Payer: Self-pay

## 2024-02-18 ENCOUNTER — Ambulatory Visit (HOSPITAL_COMMUNITY)

## 2024-02-18 MED ORDER — ATORVASTATIN CALCIUM 20 MG PO TABS
20.0000 mg | ORAL_TABLET | Freq: Every day | ORAL | 1 refills | Status: DC
  Filled 2024-02-18: qty 90, 90d supply, fill #0

## 2024-02-18 MED ORDER — ACETAMINOPHEN 500 MG PO TABS
1000.0000 mg | ORAL_TABLET | Freq: Four times a day (QID) | ORAL | 0 refills | Status: AC | PRN
  Filled 2024-02-18: qty 120, 15d supply, fill #0

## 2024-02-18 MED ORDER — NAPROXEN 250 MG PO TABS
375.0000 mg | ORAL_TABLET | Freq: Two times a day (BID) | ORAL | 0 refills | Status: AC
  Filled 2024-02-18: qty 90, 30d supply, fill #0

## 2024-02-18 MED ORDER — OLANZAPINE 2.5 MG PO TABS
2.5000 mg | ORAL_TABLET | Freq: Two times a day (BID) | ORAL | 0 refills | Status: AC
  Filled 2024-02-18: qty 30, 15d supply, fill #0

## 2024-02-18 MED ORDER — LAMOTRIGINE 25 MG PO TABS
50.0000 mg | ORAL_TABLET | Freq: Two times a day (BID) | ORAL | 0 refills | Status: AC
  Filled 2024-02-18: qty 120, 30d supply, fill #0

## 2024-02-18 MED ORDER — VENLAFAXINE HCL ER 150 MG PO CP24
150.0000 mg | ORAL_CAPSULE | Freq: Every day | ORAL | 0 refills | Status: AC
  Filled 2024-02-18: qty 30, 30d supply, fill #0

## 2024-02-18 MED ORDER — LEVOTHYROXINE SODIUM 100 MCG PO TABS
100.0000 ug | ORAL_TABLET | Freq: Every day | ORAL | 0 refills | Status: DC
  Filled 2024-02-18: qty 30, 30d supply, fill #0

## 2024-02-18 MED ORDER — OLANZAPINE 5 MG PO TABS
5.0000 mg | ORAL_TABLET | Freq: Every day | ORAL | 0 refills | Status: AC
  Filled 2024-02-18: qty 30, 30d supply, fill #0

## 2024-02-18 NOTE — Group Note (Signed)
 Recreation Therapy Group Note   Group Topic:Communication  Group Date: 02/18/2024 Start Time: 1400 End Time: 1450 Facilitators: Celestia Jeoffrey BRAVO, LRT, CTRS Location: Dayroom  Group Description: Reminisce Conversation Cards. Patients drew a laminated card out of a bag that had a word or phrase on it. Pt encouraged to speak about a time in their life or fond memory that specifically relates to the word they chose out of the bag. An example would be: "parenthood, meals, siblings, travel, or home".  LRT prompted following questions and encouraged contribution from peers to increase communication.   Goal Area(s) Addressed: Patient will increase verbal communication by conversing with peers. Patient will contribute to group discussion with minimal prompting. Patient will reminisce a positive memory or moment in their life.   Affect/Mood: Appropriate   Participation Level: Moderate   Participation Quality: Independent   Behavior: Calm and Cooperative   Speech/Thought Process: Coherent   Insight: Fair   Judgement: Fair    Modes of Intervention: Guided Discussion, Rapport Building, Socialization, and Support   Patient Response to Interventions:  Receptive   Education Outcome:  Acknowledges education   Clinical Observations/Individualized Feedback: Dedee was mostly active in their participation of session activities and group discussion. Pt interacted well with LRT and peers duration of session.    Plan: Continue to engage patient in RT group sessions 2-3x/week.   Jeoffrey BRAVO Celestia, LRT, CTRS 02/18/2024 4:46 PM

## 2024-02-18 NOTE — Plan of Care (Signed)
  Problem: Coping: Goal: Ability to identify and develop effective coping behavior will improve Outcome: Progressing   Problem: Self-Concept: Goal: Level of anxiety will decrease Outcome: Progressing   Problem: Coping: Goal: Coping ability will improve Outcome: Progressing

## 2024-02-18 NOTE — Group Note (Signed)
 Date:  02/18/2024 Time:  10:42 AM  Group Topic/Focus:  Managing Feelings:   The focus of this group is to identify what feelings patients have difficulty handling and develop a plan to handle them in a healthier way upon discharge.    Participation Level:  Active  Participation Quality:  Appropriate  Affect:  Appropriate  Cognitive:  Appropriate  Insight: Appropriate  Engagement in Group:  Engaged  Modes of Intervention:  Activity  Additional Comments:  N/A   Harlene LITTIE Gavel 02/18/2024, 10:42 AM

## 2024-02-18 NOTE — Progress Notes (Signed)
  [facility] Adult Case Management Discharge Plan :  Will you be returning to the same living situation after discharge:  Yes,  pt will return home  At discharge, do you have transportation home?: Yes,  CSW will assist with transportation  Do you have the ability to pay for your medications: Yes,  HU[employer]NA MEDICARE / HUMANA MEDICARE HMO  Release of information consent forms completed and in the chart;  Patient's signature needed at discharge.  Patient to Follow up at:  Follow-up Information     [facility] Go on 03/08/2024.   Why: Your appointment is scheduled for 03/08/24 at 9:30 AM. Contact information: [address] [place] [address] [phone]         [facility] Follow up.   Specialty: Behavioral Health Why: You are scheduled for an assessment for the Partial Hospitalization Program on Monday, 12/8 at 1:00 pm. PHP is group therapy that meets Mon-Thur from 9am-2pm and Friday from 9am-1pm and lasts for approximately 2-3 weeks. This assessment appointment will last approximately 1-1.5 hours and will be virtual via MyChart. If you need to cancel or reschedule, please call [phone] Contact information: [address] [place] [address] [place]   [address] [phone]        [facility] Behavioral Medicine at [facility] Follow up.   Why: I have contacted [facility], where they do neuropsychiatric testing. They currently have a waitlist for autism evaluation going into 2026. Please call to check your status on the waitlist. Contact information: [address] [place], [place] [address] [phone]                Next level of care provider has access to [facility] Link:no  Safety Planning and Suicide Prevention discussed: Rachel Everett, Rachel Everett, daughter, [phone]     Has patient been referred to the Quitline?: Patient refused referral for treatment  Patient  has been referred for addiction treatment: No known substance use disorder.  [address] LCSWA 02/18/2024, 11:09 AM

## 2024-02-18 NOTE — Progress Notes (Signed)
   02/18/24 0530  15 Minute Checks  Location Bedroom  Visual Appearance Calm  Behavior Sleeping  Sleep (Behavioral Health Patients Only)  Calculate sleep? (Click Yes once per 24 hr at 0600 safety check) Yes  OTHER  Documented sleep last 24 hours 9.25

## 2024-02-18 NOTE — BHH Suicide Risk Assessment (Signed)
 [facility] Discharge Suicide Risk Assessment   Principal Problem: Bipolar disorder, current episode depressed, severe, with psychotic features (HCC) Discharge Diagnoses: Principal Problem:   Bipolar disorder, current episode depressed, severe, with psychotic features (HCC)   Total Time spent with patient: 30 minutes  Musculoskeletal: Strength & Muscle Tone: within normal limits Gait & Station: normal Patient leans: N/A  Psychiatric Specialty Exam  Presentation  General Appearance:  Appropriate for Environment  Eye Contact: Fair  Speech: Clear and Coherent  Speech Volume: Normal  Handedness: Right   Mood and Affect  Mood: Euthymic  Duration of Depression Symptoms: Greater than two weeks  Affect: Appropriate   Thought Process  Thought Processes: Coherent  Descriptions of Associations:Intact  Orientation:Full (Time, Place and Person)  Thought Content:Logical  History of Schizophrenia/Schizoaffective disorder:No  Duration of Psychotic Symptoms:No data recorded Hallucinations:Hallucinations: None  Ideas of Reference:None  Suicidal Thoughts:Suicidal Thoughts: No  Homicidal Thoughts:Homicidal Thoughts: No   Sensorium  Memory: Immediate Fair; Remote Fair  Judgment: Fair  Insight: Fair   Art Therapist  Concentration: Fair  Attention Span: Fair  Recall: [employer] of Knowledge: Fair  Language: Fair   Psychomotor Activity  Psychomotor Activity: Psychomotor Activity: Normal   Assets  Assets: Communication Skills; Desire for Improvement; Social Support   Sleep  Sleep: Sleep: Fair  Estimated Sleeping Duration (Last 24 Hours): 8.25-9.00 hours  Physical Exam: Physical Exam ROS Blood pressure 123/77, pulse 96, temperature 97.9 F (36.6 C), resp. rate 16, height 5' 6 (1.676 m), weight 55.1 kg, SpO2 97%. Body mass index is 19.61 kg/m.  Mental Status Per Nursing Assessment::   On Admission:  Thoughts of violence  towards others, Self-harm thoughts  Demographic Factors:  Caucasian  Loss Factors: Decrease in vocational status  Historical Factors: Impulsivity  Risk Reduction Factors:   Living with another person, especially a relative, Positive social support, Positive therapeutic relationship, and Positive coping skills or problem solving skills  Continued Clinical Symptoms:  Bipolar Disorder:   Depressive phase  Cognitive Features That Contribute To Risk:  None    Suicide Risk:  Minimal: No identifiable suicidal ideation.  Patients presenting with no risk factors but with morbid ruminations; may be classified as minimal risk based on the severity of the depressive symptoms   Follow-up Information     [facility] Go on 03/08/2024.   Why: 03/08/24 at 9:30 AM. Contact information: [address] [place] [address] [phone]         [facility] Follow up.   Specialty: Behavioral Health Why: You are scheduled for an assessment for the Partial Hospitalization Program on Monday, 12/8 at 1:00 pm. PHP is group therapy that meets Mon-Thur from 9am-2pm and Friday from 9am-1pm and lasts for approximately 2-3 weeks. This assessment appointment will last approximately 1-1.5 hours and will be virtual via MyChart. If you need to cancel or reschedule, please call [phone] Contact information: [address] [place]  [address] [phone]                Plan Of Care/Follow-up recommendations:  Activity:  as tolerated  Allyn Foil, MD 02/18/2024, 10:48 AM

## 2024-02-18 NOTE — Group Note (Signed)
 LCSW Group Therapy Note  Group Date: 02/18/2024 Start Time: 1320 End Time: 1445   Type of Therapy and Topic:  Group Therapy - Healthy vs Unhealthy Coping Skills  Participation Level:  Active   Description of Group The focus of this group was to determine what unhealthy coping techniques typically are used by group members and what healthy coping techniques would be helpful in coping with various problems. Patients were guided in becoming aware of the differences between healthy and unhealthy coping techniques. Patients were asked to identify 2-3 healthy coping skills they would like to learn to use more effectively.  Therapeutic Goals Patients learned that coping is what human beings do all day long to deal with various situations in their lives Patients defined and discussed healthy vs unhealthy coping techniques Patients identified their preferred coping techniques and identified whether these were healthy or unhealthy Patients determined 2-3 healthy coping skills they would like to become more familiar with and use more often. Patients provided support and ideas to each other   Summary of Patient Progress:  During group, Turkessa expressed content with her discharge and her ability to face obstacles. Patient proved open to input from peers and feedback from CSW. Patient demonstrated good insight into the subject matter, was respectful of peers, and participated throughout the entire session.   Therapeutic Modalities Cognitive Behavioral Therapy Motivational Interviewing  Lum [name], LCSWA 02/18/2024  2:48 PM

## 2024-02-18 NOTE — Discharge Summary (Signed)
 Physician Discharge Summary Note  Patient:  Rachel Everett is an 63 y.o., female MRN:  [id number] DOB:  22-Apr-1960 Patient phone:  [phone] (home)  Patient address:   [address] [place] [address],   Total time spent: 40 min Date of Admission:  02/11/2024 Date of Discharge: 02/18/24  Reason for Admission:  Patient is a 63 year old female admitted voluntarily to the [facility] Psych floor from [facility] with SI thoughts to overdose on pills, HI against brother and mother, complaints of worsening depression, anxiety, and emotional distress.Patient is admitted to [facility] unit with Q15 min safety monitoring. Multidisciplinary team approach is offered. Medication management; group/milieu therapy is offered.   Principal Problem: Bipolar disorder, current episode depressed, severe, with psychotic features [facility]) Discharge Diagnoses: Principal Problem:   Bipolar disorder, current episode depressed, severe, with psychotic features (HCC)   Past Psychiatric History: see h&p  Family Psychiatric  History: see h&p Social History:  Social History   Substance and Sexual Activity  Alcohol Use Yes   Alcohol/week: 0.0 standard drinks of alcohol   Comment: rare     Social History   Substance and Sexual Activity  Drug Use No    Social History   Socioeconomic History   Marital status: Single    Spouse name: Not on file   Number of children: Not on file   Years of education: Not on file   Highest education level: Bachelor's degree (e.g., BA, AB, BS)  Occupational History   Not on file  Tobacco Use   Smoking status: Former    Current packs/day: 0.00    Average packs/day: 0.5 packs/day for 1 year (0.5 ttl pk-yrs)    Types: Cigarettes    Start date: 10/04/2014    Quit date: 10/04/2015    Years since quitting: 8.3   Smokeless tobacco: Never  Vaping Use   Vaping status: Former  Substance and Sexual Activity   Alcohol use: Yes    Alcohol/week: 0.0 standard drinks of alcohol     Comment: rare   Drug use: No   Sexual activity: Yes    Birth control/protection: Post-menopausal  Other Topics Concern   Not on file  Social History Narrative   Not on file   Social Drivers of Health   Financial Resource Strain: High Risk (06/26/2023)   Overall Financial Resource Strain (CARDIA)    Difficulty of Paying Living Expenses: Very hard  Food Insecurity: Food Insecurity Present (02/11/2024)   Hunger Vital Sign    Worried About Running Out of Food in the Last Year: Often true    Ran Out of Food in the Last Year: Often true  Transportation Needs: Unmet Transportation Needs (02/11/2024)   PRAPARE - Transportation    Lack of Transportation (Medical): Yes    Lack of Transportation (Non-Medical): Yes  Physical Activity: Inactive (06/26/2023)   Exercise Vital Sign    Days of Exercise per Week: 0 days    Minutes of Exercise per Session: 0 min  Stress: Stress Concern Present (06/26/2023)   [employer] of Occupational Health - Occupational Stress Questionnaire    Feeling of Stress : Rather much  Social Connections: Socially Isolated (02/11/2024)   Social Connection and Isolation Panel    Frequency of Communication with Friends and Family: Never    Frequency of Social Gatherings with Friends and Family: Never    Attends Religious Services: Never    Database Administrator or Organizations: Yes    Attends Banker Meetings: Never  Marital Status: Never married   Past Medical History:  Past Medical History:  Diagnosis Date   Anxiety    Bipolar 1 disorder (HCC)    Depression    GERD (gastroesophageal reflux disease)    Hyperlipidemia    SVD (spontaneous vaginal delivery)    x 1   Thyroid  disease     Past Surgical History:  Procedure Laterality Date   ANKLE SURGERY     right    ANTERIOR CRUCIATE LIGAMENT REPAIR Left    BACK SURGERY     L5-S1   CHOLECYSTECTOMY     FRACTURE SURGERY Left    elbow   TENDON REPAIR Right    angle   TONSILLECTOMY      Family History:  Family History  Problem Relation Age of Onset   Breast cancer Mother 79 - 61       recur 33s   Cancer Mother    Breast cancer Maternal Grandmother 43 - 109   Cancer Maternal Grandmother    Colon cancer Neg Hx    Esophageal cancer Neg Hx    Rectal cancer Neg Hx    Stomach cancer Neg Hx    BRCA 1/2 Neg Hx     Hospital Course:  Patient is a 63 year old female admitted voluntarily to the [facility] Psych floor from [facility] with SI thoughts to overdose on pills, HI against brother and mother, complaints of worsening depression, anxiety, and emotional distress.Patient is admitted to [facility] unit with Q15 min safety monitoring. Multidisciplinary team approach is offered. Medication management; group/milieu therapy is offered.   On admission,medications started during this admission are Effexor  XR 150 mg daily  Zyprexa  2.5 mg QAM and 7.5 mg QHS Lamictal  25 mg twice daily was started to help with mood stabilization.  Hydroxyzine  as needed was increased to 50 mg every 6 hours to help with anxiety.  Patient tolerated medications very well with no reported side effects patient's mood improved significantly during the admission.  Patient wanted to have neuropsychological testing to rule out autism.  She had multiple psychosocial stressors at home including her daughter having mental health problems.  She was referred to partial hospitalization program on discharge.  Detailed risk assessment is complete based on clinical exam and individual risk factors and acute suicide risk is low and acute violence risk is low.    On the day of discharge, patient denies SI/HI/plan and denies hallucinations.  Patient remains future oriented and is willing to participate in outpatient mental health services.  Currently, all modifiable risk of harm to self/harm to others have been addressed and patient is no longer appropriate for the acute inpatient setting and is able to continue treatment for mental  health needs in the community with the supports as indicated below.  Patient is educated and verbalized understanding of discharge plan of care including medications, follow-up appointments, mental health resources and further crisis services in the community.  He is instructed to call 911 or present to the nearest emergency room should he experience any decompensation in mood, disturbance of bowel or return of suicidal/homicidal ideations.  Patient verbalizes understanding of this education and agrees to this plan of care  Physical Findings: AIMS:  , ,  ,  ,    CIWA:    COWS:      Psychiatric Specialty Exam:  Presentation  General Appearance:  Appropriate for Environment  Eye Contact: Fair  Speech: Clear and Coherent  Speech Volume: Normal    Mood and  Affect  Mood: Euthymic  Affect: Appropriate   Thought Process  Thought Processes: Coherent  Descriptions of Associations:Intact  Orientation:Full (Time, Place and Person)  Thought Content:Logical  Hallucinations:Hallucinations: None  Ideas of Reference:None  Suicidal Thoughts:Suicidal Thoughts: No  Homicidal Thoughts:Homicidal Thoughts: No   Sensorium  Memory: Immediate Fair; Remote Fair  Judgment: Fair  Insight: Fair   Art Therapist  Concentration: Fair  Attention Span: Fair  Recall: [employer] of Knowledge: Fair  Language: Fair   Psychomotor Activity  Psychomotor Activity: Psychomotor Activity: Normal  Musculoskeletal: Strength & Muscle Tone: within normal limits Gait & Station: normal Assets  Assets: Manufacturing Systems Engineer; Desire for Improvement; Social Support   Sleep  Sleep: Sleep: Fair    Physical Exam: Physical Exam Vitals and nursing note reviewed.    ROS Blood pressure 123/77, pulse 96, temperature 97.9 F (36.6 C), resp. rate 16, height 5' 6 (1.676 m), weight 55.1 kg, SpO2 97%. Body mass index is 19.61 kg/m.   Social History   Tobacco Use   Smoking Status Former   Current packs/day: 0.00   Average packs/day: 0.5 packs/day for 1 year (0.5 ttl pk-yrs)   Types: Cigarettes   Start date: 10/04/2014   Quit date: 10/04/2015   Years since quitting: 8.3  Smokeless Tobacco Never   Tobacco Cessation:  N/A, patient does not currently use tobacco products   Blood Alcohol level:  Lab Results  Component Value Date   [facility] <15 02/11/2024    Metabolic Disorder Labs:  Lab Results  Component Value Date   HGBA1C 5.4 02/11/2024   MPG 108 02/11/2024   No results found for: PROLACTIN Lab Results  Component Value Date   CHOL 145 02/11/2024   TRIG 78 02/11/2024   HDL 59 02/11/2024   CHOLHDL 2.5 02/11/2024   VLDL 16 02/11/2024   LDLCALC 70 02/11/2024   LDLCALC 86 05/13/2023    See Psychiatric Specialty Exam and Suicide Risk Assessment completed by Attending Physician prior to discharge.  Discharge destination:  Home  Is patient on multiple antipsychotic therapies at discharge:  No   Has Patient had three or more failed trials of antipsychotic monotherapy by history:  No  Recommended Plan for Multiple Antipsychotic Therapies: NA   Allergies as of 02/18/2024   No Known Allergies      Medication List     TAKE these medications      Indication  acetaminophen  500 MG tablet Commonly known as: TYLENOL  Take 2 tablets (1,000 mg total) by mouth every 6 (six) hours as needed for mild pain (pain score 1-3).    atorvastatin  20 MG tablet Commonly known as: LIPITOR Take 1 tablet (20 mg total) by mouth daily.    lamoTRIgine  25 MG tablet Commonly known as: LAMICTAL  Take 2 tablets (50 mg total) by mouth 2 (two) times daily.    levothyroxine  100 MCG tablet Commonly known as: SYNTHROID  Take 1 tablet (100 mcg total) by mouth daily.    naproxen  375 MG tablet Commonly known as: NAPROSYN  Take 1 tablet (375 mg total) by mouth 2 (two) times daily with a meal.    OLANZapine  2.5 MG tablet Commonly known as: ZYPREXA  Take 1  tablet (2.5 mg total) by mouth 2 (two) times daily.    OLANZapine  5 MG tablet Commonly known as: ZYPREXA  Take 1 tablet (5 mg total) by mouth at bedtime.    venlafaxine  XR 150 MG 24 hr capsule Commonly known as: EFFEXOR -XR Take 1 capsule (150 mg total) by mouth daily with breakfast.  Start taking on: February 19, 2024         Follow-up Information     [facility] Go on 03/08/2024.   Why: 03/08/24 at 9:30 AM. Contact information: [address] [place] [address] [phone]         [facility] Follow up.   Specialty: Behavioral Health Why: You are scheduled for an assessment for the Partial Hospitalization Program on Monday, 12/8 at 1:00 pm. PHP is group therapy that meets Mon-Thur from 9am-2pm and Friday from 9am-1pm and lasts for approximately 2-3 weeks. This assessment appointment will last approximately 1-1.5 hours and will be virtual via MyChart. If you need to cancel or reschedule, please call [phone] Contact information: [address] [place]   [address] [phone]                Follow-up recommendations:  Activity:  as tolerated    Signed: Stepfon Rawles, MD 02/18/2024, 10:47 AM

## 2024-02-18 NOTE — Care Management Important Message (Signed)
 Important Message  Patient Details  Name: Rachel Everett MRN: [id number] Date of Birth: [birth year]   Important Message Given:  Yes - Medicare IM     Lum [name], LCSWA 02/18/2024, 11:26 AM

## 2024-02-18 NOTE — BHH Group Notes (Signed)
 Spirituality Group   Description: Participant directed exploration of values, beliefs and meaning   **Focus on locating spiritual moments, meaning making and connection.  Following a brief framework of chaplain's role and ground rules of group behavior, participants are invited to share concerns or questions that engage spiritual life. Emphasis placed on common themes and shared experiences and ways to make meaning and clarify living into one's values.   Theory/Process/Goal: Utilize the theoretical framework of group therapy established by Celena Kite, Relational Cultural Theory and Rogerian approaches to facilitate relational empathy and use of the "here and now" to foster reflection, self-awareness, and sharing.   Observations: Rachel Everett was an active participant in the group discussion.  [name] L. [name] HERO.Div

## 2024-02-18 NOTE — Progress Notes (Signed)
 Patient escorted to the medical mall entrance for discharge with belongings, discharge paperwork and a bagged dinner.

## 2024-02-18 NOTE — Plan of Care (Signed)
  Problem: Self-Concept: Goal: Ability to identify factors that promote anxiety will improve Outcome: Progressing Goal: Level of anxiety will decrease Outcome: Progressing   Problem: Coping: Goal: Will verbalize feelings Outcome: Progressing

## 2024-02-18 NOTE — Progress Notes (Signed)
   02/18/24 0830  Psych Admission Type (Psych Patients Only)  Admission Status Voluntary  Psychosocial Assessment  Patient Complaints None  Eye Contact Brief  Facial Expression Flat  Affect Appropriate to circumstance  Speech Logical/coherent  Interaction Assertive  Motor Activity Slow  Appearance/Hygiene In scrubs  Behavior Characteristics Cooperative;Appropriate to situation  Mood Anxious;Pleasant  Aggressive Behavior  Effect No apparent injury  Thought Process  Coherency WDL  Content WDL  Delusions None reported or observed  Perception WDL  Hallucination None reported or observed  Judgment Impaired  Confusion None  Danger to Self  Current suicidal ideation? Denies  Agreement Not to Harm Self Yes  Description of Agreement VERBAL  Danger to Others  Danger to Others None reported or observed  Danger to Others Abnormal  Harmful Behavior to others No threats or harm toward other people

## 2024-02-18 NOTE — Progress Notes (Signed)
 Patient discharging home. DC instructions provided and explained. AVS, TTC and AVS provided to patient. Denies SI, HI, AVH. Scheduled naprosyn  given. Patient stable at this time.

## 2024-02-19 ENCOUNTER — Other Ambulatory Visit: Payer: Self-pay

## 2024-02-19 NOTE — Progress Notes (Signed)
 This patient was discharged yesterday and her medication that was called into the outpatient was not filled. They just dropped it to it to the inpatient pharmacy this morning and the patient states she lives in [place] and there is no way she could pick them up. Her discharge medication was Tylenol  extra strength 2 tabs every 6 hours PRN, atorvastatin  20mg  daily, lomotrigine 25mg  bid, levothyxine 100mcg daily, naproxen  250mg  take 1 1/2 tab bid, olanzapine  2.5mg  bid, olanzapine  5mg  at bedtime, venlafaxine  XR 150mg  daily. her pharmacy is [facility] [address], [place]. The patient's number is [phone].  Spoke with Dr. Ruther and he told this writer to call her pharmacy and call the medications in. This clinical research associate called the pharmacy as given by patient above. Pharmacy stated that the Tylenol  can be picked up over the counter, the atorvastatin  was recently filled, lamotrigine  dosage has been changed from her previous dosage of 200mg  and she will need an escript if the dosage is being changed. Pharmacy stated she does not carry naproxyn. They will fill the olanzipine dosages and the venlafaxine  XR. Patient was called and the above message was relayed to her. She stated she will follow up with her psychiatrist for lamotrigine .

## 2024-02-20 ENCOUNTER — Other Ambulatory Visit: Payer: Self-pay

## 2024-02-22 ENCOUNTER — Ambulatory Visit (INDEPENDENT_AMBULATORY_CARE_PROVIDER_SITE_OTHER)

## 2024-02-22 DIAGNOSIS — F4310 Post-traumatic stress disorder, unspecified: Secondary | ICD-10-CM | POA: Diagnosis not present

## 2024-02-22 DIAGNOSIS — F315 Bipolar disorder, current episode depressed, severe, with psychotic features: Secondary | ICD-10-CM | POA: Diagnosis not present

## 2024-02-23 ENCOUNTER — Telehealth: Payer: Self-pay | Admitting: *Deleted

## 2024-02-23 ENCOUNTER — Encounter (HOSPITAL_COMMUNITY): Payer: Self-pay

## 2024-02-23 ENCOUNTER — Other Ambulatory Visit: Payer: Self-pay | Admitting: Family Medicine
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
## 2024-02-23 NOTE — Psych (Signed)
 Active     OP Depression     LTG: Reduce frequency, intensity, and duration of depression symptoms so that daily functioning is improved     Start:  02/24/24    Expected End:  04/26/24         LTG: Increase coping skills to manage depression and improve ability to perform daily activities     Start:  02/24/24    Expected End:  04/26/24         STG: [place] will attend at least 80% of scheduled PHP sessions        Start:  02/24/24    Expected End:  04/26/24         STG: [place] will identify cognitive patterns and beliefs that support depression     Start:  02/24/24    Expected End:  04/26/24         Work with Devere to identify the major components of a recent episode of depression: physical symptoms, major thoughts and images, and major behaviors they experienced     Start:  02/24/24         Therapist will educate patient on cognitive distortions and the rationale for treatment of depression     Start:  02/24/24         Therapist will review PLEASE Skills (Treat Physical Illness, Balance Eating, Avoid Mood-Altering Substances, Balance Sleep and Get Exercise) with patient     Start:  02/24/24

## 2024-02-23 NOTE — Telephone Encounter (Unsigned)
 Copied from CRM #[id number]. Topic: Clinical - Medication Question >> Feb 23, 2024 10:28 AM Dedra B wrote: Reason for CRM: Pt said her levothyroxine  (SYNTHROID ) was previously adjusted to 88 mcg by Dr. Antonio Meth. She didn't have a refill for the 88 mcg and was prescribed 100 mcg by a different provider and has been taking the 100 mcg. Pt would like to resume the 88 mcg and then have her levels retested. Pls call pt.

## 2024-02-24 ENCOUNTER — Encounter (HOSPITAL_COMMUNITY): Payer: Self-pay

## 2024-02-24 ENCOUNTER — Ambulatory Visit (INDEPENDENT_AMBULATORY_CARE_PROVIDER_SITE_OTHER): Admitting: Licensed Clinical Social Worker

## 2024-02-24 ENCOUNTER — Ambulatory Visit (HOSPITAL_COMMUNITY)

## 2024-02-24 DIAGNOSIS — F315 Bipolar disorder, current episode depressed, severe, with psychotic features: Secondary | ICD-10-CM | POA: Diagnosis not present

## 2024-02-24 DIAGNOSIS — F1210 Cannabis abuse, uncomplicated: Secondary | ICD-10-CM

## 2024-02-24 DIAGNOSIS — F4310 Post-traumatic stress disorder, unspecified: Secondary | ICD-10-CM

## 2024-02-24 NOTE — Progress Notes (Unsigned)
 Psychiatric Initial Adult Assessment   Patient Identification: Rachel Everett MRN:  [id number] Date of Evaluation:  02/24/2024 Referral Source: Recently discharged from inpatient admission Chief Complaint:   I felt overstimulated. Visit Diagnosis:    ICD-10-CM   1. Bipolar disorder, current episode depressed, severe, with psychotic features (HCC)  F31.5     2. PTSD (post-traumatic stress disorder)  F43.10     3. Marijuana abuse  F12.10       History of Present Illness: Rachel Everett 63 year old female presents after recent inpatient admission.  Carries a diagnosis related to bipolar disorder, generalized anxiety disorder, posttraumatic stress disorder and major depressive disorder, self-reported autism spectrum disorder.   Rachel Everett reports she admitted her self to inpatient admission due to suicidal ideations.  States her mental health started declining due to smoking marijuana daily for the past 7 years.  Reports she was feeling stressed and overwhelmed she currently resides with her daughter and multiple animals in the home.    During  inpatient admission [facility]) she reports reports medication adjustments while hospitalized currently prescribed Lamictal  50 mg twice daily, Zyprexa  5 mg nightly and 2.5 mg Zyprexa  twice daily.  Patient reports she has only been taking 2.5 mg once a day and 5 mg at night.  States feeling oversedated with current medication recommendation.  States taking Effexor  150 mg daily.  She reports a history of inpatient hospitalization.  Reports working to multiple childhood trauma issues.  Coupled with the passing of her father who she was close with.  States she and her mother have a strained relationship states her mother is 42 years old.  States both of her brothers have a better relationship with her mother and is currently inherited most of assets after the passing of her father.  Reports concerns related to family strain.  Recently allowed her daughter to move  back in with her her daughter relocated from [place] .  States she is now in the house with her daughter and 5 dogs.  States her household can be overwhelming.  Reported she was utilizing marijuana frequently to which she attributes to her psychotic break.  Patient to start partial outpatient programming on 02/24/2024  Per discharge summary: Per admission assessment note:patient stated that she had decided to take pills that she has to end her life or come in for help and chose the lab.  Patient reports that she had sent text messages out to all of the people and according to her saying her goodbyes to them.  Patient reports that life has just become too challenging for her.  Patient states she lives in a 2 bedroom apartment with her daughter and 5 dogs.  Patient says that daughter is currently detoxing from alcohol and also has diagnosis of autism.  Patient states that her living situation has become too overwhelming for her to want to go along living.  Currently she is denying suicidal or homicidal ideations.  Denies auditory visual hallucinations.  She is alert and oriented and actively engaged.  Reports she continues to take Lamictal  twice a day for mood stabilization.  States she is seeking additional coping skills to deal with siblings.    Associated Signs/Symptoms: Depression Symptoms:  depressed mood, feelings of worthlessness/guilt, difficulty concentrating, anxiety, (Hypo) Manic Symptoms:  Distractibility, Anxiety Symptoms:  Excessive Worry, Psychotic Symptoms:  Hallucinations: None PTSD Symptoms: NA  Past Psychiatric History: Bipolar disorder, major depressive disorder with psychotic features, generalized anxiety, posttraumatic stress disorder.  Self-reported autism diagnosis.  Previous Psychotropic Medications: Yes  Substance Abuse History in the last 12 months:  Yes.    Consequences of Substance Abuse: NA  Past Medical History:  Past Medical History:  Diagnosis Date    Anxiety    Bipolar 1 disorder (HCC)    Depression    GERD (gastroesophageal reflux disease)    Hyperlipidemia    Hypothyroidism    SVD (spontaneous vaginal delivery)    x 1   Thyroid  disease     Past Surgical History:  Procedure Laterality Date   ANKLE SURGERY     right    ANTERIOR CRUCIATE LIGAMENT REPAIR Left    BACK SURGERY     L5-S1   CHOLECYSTECTOMY     FRACTURE SURGERY Left    elbow   TENDON REPAIR Right    angle   TONSILLECTOMY      Family Psychiatric History: Reported family history Brother: Autism and attention deficit disorder,   Family History:  Family History  Problem Relation Age of Onset   Anxiety disorder Mother    Depression Mother    Breast cancer Mother 78 - 39       recur 36s   Cancer Mother    Depression Brother    Anxiety disorder Brother    Alcohol abuse Maternal Grandfather    Breast cancer Maternal Grandmother 58 - 75   Cancer Maternal Grandmother    Colon cancer Neg Hx    Esophageal cancer Neg Hx    Rectal cancer Neg Hx    Stomach cancer Neg Hx    BRCA 1/2 Neg Hx     Social History:   Social History   Socioeconomic History   Marital status: Single    Spouse name: Not on file   Number of children: 1   Years of education: Not on file   Highest education level: Bachelor's degree (e.g., BA, AB, BS)  Occupational History   Not on file  Tobacco Use   Smoking status: Former    Current packs/day: 0.00    Average packs/day: 0.5 packs/day for 1 year (0.5 ttl pk-yrs)    Types: Cigarettes    Start date: 10/04/2014    Quit date: 10/04/2015    Years since quitting: 8.4   Smokeless tobacco: Never  Vaping Use   Vaping status: Former  Substance and Sexual Activity   Alcohol use: Yes    Alcohol/week: 0.0 standard drinks of alcohol    Comment: rare   Drug use: Not Currently    Types: Marijuana   Sexual activity: Yes    Birth control/protection: Post-menopausal  Other Topics Concern   Not on file  Social History Narrative   Not on  file   Social Drivers of Health   Tobacco Use: Medium Risk (02/25/2024)   Patient History    Smoking Tobacco Use: Former    Smokeless Tobacco Use: Never    Passive Exposure: Not on file  Financial Resource Strain: High Risk (06/26/2023)   Overall Financial Resource Strain (CARDIA)    Difficulty of Paying Living Expenses: Very hard  Food Insecurity: Food Insecurity Present (02/11/2024)   Epic    Worried About Programme Researcher, Broadcasting/film/video in the Last Year: Often true    Ran Out of Food in the Last Year: Often true  Transportation Needs: Unmet Transportation Needs (02/11/2024)   Epic    Lack of Transportation (Medical): Yes    Lack of Transportation (Non-Medical): Yes  Physical Activity: Inactive (06/26/2023)   Exercise Vital Sign    Days of Exercise  per Week: 0 days    Minutes of Exercise per Session: 0 min  Stress: Stress Concern Present (06/26/2023)   [employer] of Occupational Health - Occupational Stress Questionnaire    Feeling of Stress : Rather much  Social Connections: Socially Isolated (02/11/2024)   Social Connection and Isolation Panel    Frequency of Communication with Friends and Family: Never    Frequency of Social Gatherings with Friends and Family: Never    Attends Religious Services: Never    Database Administrator or Organizations: Yes    Attends Banker Meetings: Never    Marital Status: Never married  Depression (PHQ2-9): High Risk (02/25/2024)   Depression (PHQ2-9)    PHQ-2 Score: 19  Alcohol Screen: Low Risk (02/11/2024)   Alcohol Screen    Last Alcohol Screening Score (AUDIT): 0  Housing: Low Risk (02/11/2024)   Epic    Unable to Pay for Housing in the Last Year: No    Number of Times Moved in the Last Year: 0    Homeless in the Last Year: No  Utilities: Not At Risk (02/11/2024)   Epic    Threatened with loss of utilities: No  Health Literacy: Adequate Health Literacy (05/13/2023)   B1300 Health Literacy    Frequency of need for help with  medical instructions: Rarely    Additional Social History:   Allergies:  No Known Allergies  Metabolic Disorder Labs: Lab Results  Component Value Date   HGBA1C 5.4 02/11/2024   MPG 108 02/11/2024   No results found for: PROLACTIN Lab Results  Component Value Date   CHOL 145 02/11/2024   TRIG 78 02/11/2024   HDL 59 02/11/2024   CHOLHDL 2.5 02/11/2024   VLDL 16 02/11/2024   LDLCALC 70 02/11/2024   LDLCALC 86 05/13/2023   Lab Results  Component Value Date   TSH 1.606 02/11/2024    Therapeutic Level Labs: Lab Results  Component Value Date   LITHIUM  0.92 12/20/2022   No results found for: CBMZ No results found for: VALPROATE  Current Medications: Current Outpatient Medications  Medication Sig Dispense Refill   acetaminophen  (TYLENOL ) 500 MG tablet Take 2 tablets (1,000 mg total) by mouth every 6 (six) hours as needed for mild pain (pain score 1-3). 120 tablet 0   atorvastatin  (LIPITOR) 20 MG tablet Take 1 tablet (20 mg total) by mouth daily. 90 tablet 1   lamoTRIgine  (LAMICTAL ) 25 MG tablet Take 2 tablets (50 mg total) by mouth 2 (two) times daily. 120 tablet 0   levothyroxine  (SYNTHROID ) 100 MCG tablet Take 1 tablet (100 mcg total) by mouth daily. (Patient taking differently: Take 100 mcg by mouth daily. Taking per patient) 30 tablet 0   naproxen  (NAPROSYN ) 250 MG tablet Take 1.5 tablets (375 mg total) by mouth 2 (two) times daily with a meal. (Patient not taking: Reported on 02/25/2024) 90 tablet 0   OLANZapine  (ZYPREXA ) 2.5 MG tablet Take 1 tablet (2.5 mg total) by mouth 2 (two) times daily. 60 tablet 0   OLANZapine  (ZYPREXA ) 5 MG tablet Take 1 tablet (5 mg total) by mouth at bedtime. 30 tablet 0   venlafaxine  XR (EFFEXOR -XR) 150 MG 24 hr capsule Take 1 capsule (150 mg total) by mouth daily with breakfast. 30 capsule 0   No current facility-administered medications for this visit.    Musculoskeletal: Strength & Muscle Tone: within normal limits Gait &  Station: normal Patient leans: N/A  Psychiatric Specialty Exam: Review of Systems  Psychiatric/Behavioral:  Positive for sleep disturbance. The patient is nervous/anxious.   All other systems reviewed and are negative.   There were no vitals taken for this visit.There is no height or weight on file to calculate BMI.  General Appearance: Casual  Eye Contact:  Good  Speech:  Clear and Coherent  Volume:  Normal  Mood:  Anxious and Depressed  Affect:  Congruent  Thought Process:  Coherent  Orientation:  Full (Time, Place, and Person)  Thought Content:  Logical  Suicidal Thoughts:  No  Homicidal Thoughts:  No  Memory:  Immediate;   Good Recent;   Good  Judgement:  Good  Insight:  Good  Psychomotor Activity:  Normal  Concentration:  Concentration: Good  Recall:  Good  Fund of Knowledge:Good  Language: Good  Akathisia:  No  Handed:  Right  AIMS (if indicated):  not done  Assets:  Communication Skills Desire for Improvement  ADL's:  Intact  Cognition: WNL  Sleep:  Fair   Screenings: AUDIT    Flowsheet Row Admission (Discharged) from 02/11/2024 in [facility] BE[facility]AVIORAL MEDICINE  Alcohol Use Disorder Identification Test Final Score (AUDIT) 0   GAD-7    Flowsheet Row Office Visit from 05/13/2023 in [facility] [place] - A Dept Of [place]. [facility] Office Visit from 01/21/2021 in [facility] [place] - A Dept Of [place] [facility]. [facility] Office Visit from 08/28/2017 in [facility] [place] - A Dept Of [place] [facility]. [facility] Office Visit from 04/24/2017 in [facility] [place] - A Dept Of [place] [facility]. [facility] Office Visit from 03/24/2017 in [facility] Comm Health [place] - A Dept Of [place] [facility]. [facility]  Total GAD-7 Score 19 5 14 12 16    PHQ2-9    Flowsheet Row Office Visit from 05/13/2023 in [facility] - A Dept Of [place] [facility]. [facility] Office Visit from  01/21/2021 in [facility] [place] - A Dept Of [place] [facility]. [facility] Office Visit from 08/28/2017 in [facility] [place] - A Dept Of [place] [facility]. [facility] Office Visit from 04/24/2017 in [facility] [place] - A Dept Of [place] [facility]. [facility] Office Visit from 03/24/2017 in [facility] Health [place] - A Dept Of [place] [facility]. [facility]  PHQ-2 Total Score 2 2 2 2 4   PHQ-9 Total Score 18 8 14 13 14    Flowsheet Row Counselor from 02/22/2024 in [facility] Most recent reading at 02/22/2024  1:00 PM Admission (Discharged) from 02/11/2024 in [facility] BEHAVIORAL MEDICINE Most recent reading at 02/11/2024  6:00 PM ED from 02/11/2024 in [facility] Most recent reading at 02/11/2024 11:21 AM  C-SSRS RISK CATEGORY High Risk High Risk High Risk    Assessment and Plan: Orlene Salmons is  enrolled in Partial Hospitalization Program, patient's current medications are to be continued, the following medications are being continued. Patient reported self adjusting medication due to feeling oversedated. comprehensive treatment plan will be developed and side effects of medications have been reviewed with patient  Treatment options and alternatives reviewed with patient and patient understands the above plan. Treatment plan was reviewed and agreed upon by NP T.Ezzard and patient Heide Brossart  need for group services.  Collaboration of Care: Medication Management AEB Zyprexa  5 mg nightly  Patient/Guardian was advised Release of Information must be  obtained prior to any record release in order to collaborate their care with an outside provider. Patient/Guardian was advised if they have not already done so to contact the registration department to sign all necessary forms in order for us  to release information regarding their care.   Consent: Patient/Guardian gives verbal consent for treatment  and assignment of benefits for services provided during this visit. Patient/Guardian expressed understanding and agreed to proceed.   Staci LOISE Kerns, NP 12/12/202511:47 AM

## 2024-02-25 ENCOUNTER — Encounter (HOSPITAL_COMMUNITY): Payer: Self-pay

## 2024-02-25 ENCOUNTER — Ambulatory Visit (HOSPITAL_COMMUNITY)

## 2024-02-25 ENCOUNTER — Ambulatory Visit (INDEPENDENT_AMBULATORY_CARE_PROVIDER_SITE_OTHER): Admitting: Licensed Clinical Social Worker

## 2024-02-25 DIAGNOSIS — F315 Bipolar disorder, current episode depressed, severe, with psychotic features: Secondary | ICD-10-CM | POA: Diagnosis not present

## 2024-02-25 DIAGNOSIS — F4310 Post-traumatic stress disorder, unspecified: Secondary | ICD-10-CM

## 2024-02-25 NOTE — Progress Notes (Signed)
 Spoke with patient in person for PHP. States this is her first time in PHPas she was recommended by [place] after and inpatient stay for 1 week. She was depressed with suicidal ideations. This is her 2nd day in [facility] and is enjoying groups. She lives with her daughter in a 2 bedroom apartment and 5 dogs. Has had a lot of loss in the past and the dogs are her family. She and her daughter both have Autism. Her daughter is transgender and wants to be viewed as a female even though she dresses as female. She is trying to figure out life with a lot of changes. Her Father died 2 years ago and she learned that her mother is only leaving her 35% of the estate to share with her daughter. She was suppose to get 50% and thought she would have a house to live in. Now she is sad about not having enough financially to live in a house. On scale 1-10 as 10 being worst she rates depression at 3 and anxiety at 3. Denies SI/HI or AVH. PHQ9=19. No other issues or complaints. No side effects from medications.

## 2024-02-25 NOTE — Telephone Encounter (Signed)
 Rachel Everett   02/25/2024  3:43 PM  Type: General  Pt calling back to see if she has gotten a refill on the 88 mcg yet. I relayed Dr. Antonio Chases message. She says that she was on 100 mcg several months and then was tested and that test indicated that she needed to decrease to 88 mcg. She requested a refill at the pharmacy and took the medication not realizing that it was for 100mcg again. She had only been on the for a short while and was supposed to test after being on it, but she has since not been able to get a fill of the . States she has been on 100 for 2 weeks and dr had already determined it was too high. Please return call at [phone]

## 2024-02-26 ENCOUNTER — Ambulatory Visit (HOSPITAL_COMMUNITY)

## 2024-02-26 MED ORDER — LEVOTHYROXINE SODIUM 88 MCG PO TABS: 88.0000 ug | ORAL_TABLET | Freq: Every day | ORAL | 0 refills | Status: DC

## 2024-02-26 NOTE — Telephone Encounter (Signed)
 Per Dr. Antonio- stay on levothyroxine  , recheck TSH in 2 months.  Spoke w/ Pt- informed of recommendations. Offered to schedule lab appt, Pt states she will call back. Rx sent.

## 2024-02-29 ENCOUNTER — Ambulatory Visit (HOSPITAL_COMMUNITY)

## 2024-03-01 ENCOUNTER — Ambulatory Visit (HOSPITAL_COMMUNITY)

## 2024-03-01 VITALS — Resp 16 | Wt 122.0 lb

## 2024-03-01 DIAGNOSIS — F315 Bipolar disorder, current episode depressed, severe, with psychotic features: Secondary | ICD-10-CM

## 2024-03-01 NOTE — Psych (Signed)
 [facility] PHP THERAPIST PROGRESS NOTE  Rachel Everett [id number]  Session Time: 9:00-10:00  Participation Level: Active  Behavioral Response: CasualAlertAnxious and Depressed  Type of Therapy: Group Therapy  Treatment Goals addressed: Coping  Progress Towards Goals: Progressing  Interventions: CBT, DBT, Solution Focused, Strength-based, Supportive, and Reframing  Summary: Rachel Everett is a 63 y.o. female who presents with Bipolar depression symptoms. Clinician led check-in regarding current stressors and situation, and review of patient completed daily inventory. Clinician utilized active listening and empathetic response and validated patient emotions. Clinician facilitated processing group on pertinent issues.   Therapist Response: Patient arrived within time allowed. Patient rates her depression at a 3 and her anxiety at an 9 on a scale of 1-10 with 10 being best. When asked about sleep and appetite, pt reports they slept 4.5 hours and ate 3x yesterday. Pt states she feels overwhelmed by her own mental health needs while trying to help her daughter with medical and mental health needs. She shares she is overwhelmed by not having her own space due to living in a 2 bedroom apartment with her daughter and 5 dogs. She shares she has anxiety about current medications and is able to speak with NP about those concerns.  Patient able to process. Patient engaged in discussion.      Session Time: 10:00 am - 11:00 am   Participation Level: Active   Behavioral Response: Casual Alert and Anxious/Depressed   Type of Therapy: Group Therapy   Treatment Goals addressed: Coping   Progress Towards Goals: Progressing   Interventions: CBT, DBT, Solution Focused, Strength-based, Supportive, and Reframing   Therapist Response: Clinician led processing group for pt's current struggles. Group members shared stressors and provided support and feedback. Clinician brought in topics of self-care not being  selfish, how to practice self-care in a variety of ways and settings, and the idea that No. is a complete sentence.  Summary: Pt able to process and provide support to group. Pt reports she wants to work on increasing her amount of self-care without guilt, and now sees it as fuel for everyday life instead of a luxury.    Session Time: 11:00 am - 12:00 pm   Participation Level: Active   Behavioral Response: Casual Alert and Anxious/Depressed   Type of Therapy: Group Therapy   Treatment Goals addressed: Coping   Progress Towards Goals: Progressing   Interventions: CBT, DBT, Solution Focused, Strength-based, Supportive, and Reframing   Therapist Response: Group was led by [facility] chaplain, Amy [name].   Summary: Pt engaged in discussion.      Session Time: 12:00 pm - 12:30 pm   Participation Level: Active   Behavioral Response: Casual Alert and Anxious/Depressed   Type of Therapy: Group Therapy   Treatment Goals addressed: Coping   Progress Towards Goals: Progressing   Interventions: Psychologist, Occupational, Supportive   Therapist Response: Reflection Group: Patients encouraged to practice skills and interpersonal techniques or work on mindfulness and relaxation techniques. The importance of self-care and making skills part of a routine to increase usage were stressed.   Summary: Pt engaged in discussion.      Session Time: 12:30 pm - 1:30 pm   Participation Level: Active   Behavioral Response: Casual Alert and Anxious/Depressed   Type of Therapy: Group Therapy   Treatment Goals addressed: Coping   Progress Towards Goals: Progressing   Interventions: OT group   Therapist Response: Group was led by occupational therapist, Edward Hollan.    Summary: Pt engaged in  discussion.            Session Time: 1:30 pm - 1:45 pm   Participation Level: Active   Behavioral Response: Casual Alert and Anxious/Depressed   Type of Therapy: Group Therapy    Treatment Goals addressed: Coping   Progress Towards Goals: Progressing   Interventions: CBT, DBT, Solution Focused, Strength-based, Supportive, and Reframing   Therapist Response: Clinician led check-out. Clinician assessed for immediate needs, medication compliance and efficacy, and safety concerns?   Summary: At check-out, patient reports no immediate concerns. Patient demonstrates progress as evidenced by engagement and responsiveness to treatment. Patient denies SI/HI/self-harm thoughts at the end of group.   Suicidal/Homicidal: Nowithout intent/plan    Plan: Pt will continue in PHP while working to decrease depression and anxiety symptoms, increase daily functioning, and increase ability to manage symptoms in a healthy manner.   Collaboration of Care: Medication Management AEB Staci Kerns, NP and Other Hildegard Macadam, RN  Patient/Guardian was advised Release of Information must be obtained prior to any record release in order to collaborate their care with an outside provider. Patient/Guardian was advised if they have not already done so to contact the registration department to sign all necessary forms in order for us  to release information regarding their care.   Consent: Patient/Guardian gives verbal consent for treatment and assignment of benefits for services provided during this visit. Patient/Guardian expressed understanding and agreed to proceed.   Diagnosis: Bipolar disorder, current episode depressed, severe, with psychotic features (HCC) [F31.5]    1. Bipolar disorder, current episode depressed, severe, with psychotic features [facility])       [place] JINNY Devoid, [facility] 03/01/2024

## 2024-03-01 NOTE — Progress Notes (Signed)
 Spoke with patient in person for PHP. States that groups are going well. Today she has felt dizzy, lightheaded and becomes more easily out of breath with exertion. Did orthostatic BP. Sitting 150/73 with Pulse 90 and 98 % sat. Standing 133/80 with pulse 106 and 98 Sat. She states that this has been ongoing for the past 3-4 days. She is unsure why. Explained that it would be best if she was seen at urgent care for a work up, she states she will go after group today. She did not feel faint and was able to walk without issue. She states that she has had no side effects from medication that she is aware of. On scale 1-10 as 10 being worst she rates depression at 3 and anxiety at 8. Denie SI/HI or AVH. No other issues or complaints.

## 2024-03-02 ENCOUNTER — Ambulatory Visit (INDEPENDENT_AMBULATORY_CARE_PROVIDER_SITE_OTHER): Admitting: Licensed Clinical Social Worker

## 2024-03-02 ENCOUNTER — Ambulatory Visit (HOSPITAL_COMMUNITY)

## 2024-03-02 DIAGNOSIS — F315 Bipolar disorder, current episode depressed, severe, with psychotic features: Secondary | ICD-10-CM

## 2024-03-02 DIAGNOSIS — F4310 Post-traumatic stress disorder, unspecified: Secondary | ICD-10-CM

## 2024-03-03 ENCOUNTER — Ambulatory Visit (HOSPITAL_COMMUNITY)

## 2024-03-04 ENCOUNTER — Encounter (HOSPITAL_COMMUNITY): Payer: Self-pay

## 2024-03-04 ENCOUNTER — Ambulatory Visit (HOSPITAL_COMMUNITY)

## 2024-03-04 DIAGNOSIS — F315 Bipolar disorder, current episode depressed, severe, with psychotic features: Secondary | ICD-10-CM

## 2024-03-05 NOTE — Progress Notes (Signed)
 BH MD/PA/NP OP Progress Note  03/05/2024 10:25 AM Rachel Everett  MRN:  [id number]  Chief Complaint: Weekly progress assessment  Evaluation: Rachel Everett currently enrolled in partial outpatient hospitalization programming.  Carries a diagnosis related to bipolar disorder with psychotic features, major depressive disorder and generalized anxiety disorder.  Reports she continues to struggle with mood irritability and self-reported autism diagnosis.  States  my daughter lives with me and she has a alcohol problem, and I like to be by myself, I am trying to find a happy balance. Rachel Everett denied concerns related to suicidal or homicidal ideations.  Denies auditory, visual hallucinations or mania.  Does report experiencing unsteady gait and dizziness which she reports started roughly 3 days ago.  Discussed following up with urgent care and/or primary care provider.  Has not started any new medication.  Patient is currently only taking Zyprexa  5 mg nightly self tapered.  Collected vitals well attending group setting.  124/98 heart rate 75. Encouraged to rise slowly.  Sit before standing.  She appeared amendable to plan.  Will continue to monitor symptoms.  Support, encouragement and reassurance was provided.  Per YEP:Dldjw Ransford 63 year old female presents after recent inpatient admission.  Carries a diagnosis related to bipolar disorder, generalized anxiety disorder, posttraumatic stress disorder and major depressive disorder, self-reported autism spectrum disorder.   Rachel Everett reports she admitted her self to inpatient admission due to suicidal ideations.  States her mental health started declining due to smoking marijuana daily for the past 7 years.  Reports she was feeling stressed and overwhelmed she currently resides with her daughter and multiple animals in the home.    Visit Diagnosis:    ICD-10-CM   1. Bipolar disorder, current episode depressed, severe, with psychotic features (HCC)  F31.5       Past  Psychiatric History: Previous inpatient admissions.  Carries a diagnosis related to bipolar disorder, generalized anxiety disorder and major depressive disorder.  She was initiated on Zyprexa  5 mg p.o. twice daily states she is unable to tolerate twice daily as she felt too sedated and unsteady.   Past Medical History:  Past Medical History:  Diagnosis Date   Anxiety    Bipolar 1 disorder (HCC)    Depression    GERD (gastroesophageal reflux disease)    Hyperlipidemia    Hypothyroidism    SVD (spontaneous vaginal delivery)    x 1   Thyroid  disease     Past Surgical History:  Procedure Laterality Date   ANKLE SURGERY     right    ANTERIOR CRUCIATE LIGAMENT REPAIR Left    BACK SURGERY     L5-S1   CHOLECYSTECTOMY     FRACTURE SURGERY Left    elbow   TENDON REPAIR Right    angle   TONSILLECTOMY      Family Psychiatric History:   Family History:  Family History  Problem Relation Age of Onset   Anxiety disorder Mother    Depression Mother    Breast cancer Mother 46 - 85       recur 84s   Cancer Mother    Depression Brother    Anxiety disorder Brother    Alcohol abuse Maternal Grandfather    Breast cancer Maternal Grandmother 1 - 22   Cancer Maternal Grandmother    Colon cancer Neg Hx    Esophageal cancer Neg Hx    Rectal cancer Neg Hx    Stomach cancer Neg Hx    BRCA 1/2 Neg Hx  Social History:  Social History   Socioeconomic History   Marital status: Single    Spouse name: Not on file   Number of children: 1   Years of education: Not on file   Highest education level: Bachelor's degree (e.g., BA, AB, BS)  Occupational History   Not on file  Tobacco Use   Smoking status: Former    Current packs/day: 0.00    Average packs/day: 0.5 packs/day for 1 year (0.5 ttl pk-yrs)    Types: Cigarettes    Start date: 10/04/2014    Quit date: 10/04/2015    Years since quitting: 8.4   Smokeless tobacco: Never  Vaping Use   Vaping status: Former  Substance and Sexual  Activity   Alcohol use: Yes    Alcohol/week: 0.0 standard drinks of alcohol    Comment: rare   Drug use: Not Currently    Types: Marijuana   Sexual activity: Yes    Birth control/protection: Post-menopausal  Other Topics Concern   Not on file  Social History Narrative   Not on file   Social Drivers of Health   Tobacco Use: Medium Risk (02/25/2024)   Patient History    Smoking Tobacco Use: Former    Smokeless Tobacco Use: Never    Passive Exposure: Not on file  Financial Resource Strain: High Risk (06/26/2023)   Overall Financial Resource Strain (CARDIA)    Difficulty of Paying Living Expenses: Very hard  Food Insecurity: Food Insecurity Present (02/11/2024)   Epic    Worried About Programme Researcher, Broadcasting/film/video in the Last Year: Often true    Ran Out of Food in the Last Year: Often true  Transportation Needs: Unmet Transportation Needs (02/11/2024)   Epic    Lack of Transportation (Medical): Yes    Lack of Transportation (Non-Medical): Yes  Physical Activity: Inactive (06/26/2023)   Exercise Vital Sign    Days of Exercise per Week: 0 days    Minutes of Exercise per Session: 0 min  Stress: Stress Concern Present (06/26/2023)   [employer] of Occupational Health - Occupational Stress Questionnaire    Feeling of Stress : Rather much  Social Connections: Socially Isolated (02/11/2024)   Social Connection and Isolation Panel    Frequency of Communication with Friends and Family: Never    Frequency of Social Gatherings with Friends and Family: Never    Attends Religious Services: Never    Database Administrator or Organizations: Yes    Attends Banker Meetings: Never    Marital Status: Never married  Depression (PHQ2-9): High Risk (02/25/2024)   Depression (PHQ2-9)    PHQ-2 Score: 19  Alcohol Screen: Low Risk (02/11/2024)   Alcohol Screen    Last Alcohol Screening Score (AUDIT): 0  Housing: Low Risk (02/11/2024)   Epic    Unable to Pay for Housing in the Last  Year: No    Number of Times Moved in the Last Year: 0    Homeless in the Last Year: No  Utilities: Not At Risk (02/11/2024)   Epic    Threatened with loss of utilities: No  Health Literacy: Adequate Health Literacy (05/13/2023)   B1300 Health Literacy    Frequency of need for help with medical instructions: Rarely    Allergies: Allergies[1]  Metabolic Disorder Labs: Lab Results  Component Value Date   HGBA1C 5.4 02/11/2024   MPG 108 02/11/2024   No results found for: PROLACTIN Lab Results  Component Value Date   CHOL 145 02/11/2024  TRIG 78 02/11/2024   HDL 59 02/11/2024   CHOLHDL 2.5 02/11/2024   VLDL 16 02/11/2024   LDLCALC 70 02/11/2024   LDLCALC 86 05/13/2023   Lab Results  Component Value Date   TSH 1.606 02/11/2024   TSH 0.025 (L) 06/26/2023    Therapeutic Level Labs: Lab Results  Component Value Date   LITHIUM  0.92 12/20/2022   LITHIUM  1.1 01/21/2021   No results found for: VALPROATE No results found for: CBMZ  Current Medications: Current Outpatient Medications  Medication Sig Dispense Refill   acetaminophen  (TYLENOL ) 500 MG tablet Take 2 tablets (1,000 mg total) by mouth every 6 (six) hours as needed for mild pain (pain score 1-3). 120 tablet 0   atorvastatin  (LIPITOR) 20 MG tablet Take 1 tablet (20 mg total) by mouth daily. 90 tablet 1   lamoTRIgine  (LAMICTAL ) 25 MG tablet Take 2 tablets (50 mg total) by mouth 2 (two) times daily. 120 tablet 0   levothyroxine  (SYNTHROID ) 88 MCG tablet Take 1 tablet (88 mcg total) by mouth daily before breakfast. 90 tablet 0   naproxen  (NAPROSYN ) 250 MG tablet Take 1.5 tablets (375 mg total) by mouth 2 (two) times daily with a meal. (Patient not taking: Reported on 02/25/2024) 90 tablet 0   OLANZapine  (ZYPREXA ) 2.5 MG tablet Take 1 tablet (2.5 mg total) by mouth 2 (two) times daily. 60 tablet 0   OLANZapine  (ZYPREXA ) 5 MG tablet Take 1 tablet (5 mg total) by mouth at bedtime. 30 tablet 0   venlafaxine  XR  (EFFEXOR -XR) 150 MG 24 hr capsule Take 1 capsule (150 mg total) by mouth daily with breakfast. 30 capsule 0   No current facility-administered medications for this visit.     Musculoskeletal: Strength & Muscle Tone: within normal limits Gait & Station: unsteady Patient leans: N/A  Psychiatric Specialty Exam: Review of Systems  There were no vitals taken for this visit.There is no height or weight on file to calculate BMI.  General Appearance: Casual  Eye Contact:  Good  Speech:  Clear and Coherent  Volume:  Normal  Mood:  Anxious and Depressed  Affect:  Congruent  Thought Process:  Coherent  Orientation:  Full (Time, Place, and Person)  Thought Content: Logical   Suicidal Thoughts:  No  Homicidal Thoughts:  No  Memory:  Immediate;   Fair Recent;   Fair  Judgement:  Fair  Insight:  Fair  Psychomotor Activity:  Normal  Concentration:  Concentration: Good  Recall:  Good  Fund of Knowledge: Good  Language: Good  Akathisia:  No  Handed:  Right  AIMS (if indicated): not done  Assets:  Communication Skills Desire for Improvement  ADL's:  Intact  Cognition: WNL  Sleep:  Fair   Screenings: AUDIT    Flowsheet Row Admission (Discharged) [facility]IORAL [facility]  Alcohol Use Disorder Identification Test Final Score (AUDIT) 0   GAD-7    Flowsheet Row Office Visit from 05/13/2023 in [facility] [place] - A Dept Of . [facility] Office Visit from 01/21/2021 in [facility] Comm Health [place] - A Dept Of Rachel Everett DEL. [facility] Office Visit from 08/28/2017 in [facility] [place] - A Dept Of Rachel Everett DEL. [facility] Office Visit from 04/24/2017 in [facility] [place] - A Dept Of Rachel Everett DEL. [facility] Office Visit from 03/24/2017 in [facility] [place] - A Dept Of Rachel Everett DEL. [facility]  Total  GAD-7 Score 19 5 14 12 16    PHQ2-9    Flowsheet Row Counselor from  02/25/2024 in [facility] Office Visit from 05/13/2023 in [facility] - A Dept Of [place]. [facility] Office Visit from 01/21/2021 in [facility] [place] - A Dept Of Rachel Everett DEL. [facility] Office Visit from 08/28/2017 in [facility] Comm Health [place] - A Dept Of Rachel Everett DEL. [facility] Office Visit from 04/24/2017 in [facility] Comm [facility] [place] - A Dept Of Rachel Everett DEL. [facility]  PHQ-2 Total Score 3 2 2 2 2   PHQ-9 Total Score 19 18 8 14 13    Flowsheet Row Counselor from 02/25/2024 in [facility] Counselor from 02/22/2024 in [facility] Admission (Discharged) from 02/11/2024 in [facility] BEHAVIORAL MEDICINE  C-SSRS RISK CATEGORY Error: Question 2 not populated High Risk High Risk     Assessment and Plan:  Continue partial outpatient hospitalization programming - Continue current medications as directed  Collaboration of Care: Collaboration of Care: Medication Management AEB currently taking 5 mg of Zyprexa  at bedtime discontinue daytime dose  Patient/Guardian was advised Release of Information must be obtained prior to any record release in order to collaborate their care with an outside provider. Patient/Guardian was advised if they have not already done so to contact the registration department to sign all necessary forms in order for us  to release information regarding their care.   Consent: Patient/Guardian gives verbal consent for treatment and assignment of benefits for services provided during this visit. Patient/Guardian expressed understanding and agreed to proceed.    Staci LOISE Kerns, NP 03/05/2024, 10:25 AM     [1] No Known Allergies

## 2024-03-07 ENCOUNTER — Ambulatory Visit (HOSPITAL_COMMUNITY)

## 2024-03-07 DIAGNOSIS — F4310 Post-traumatic stress disorder, unspecified: Secondary | ICD-10-CM

## 2024-03-07 DIAGNOSIS — F315 Bipolar disorder, current episode depressed, severe, with psychotic features: Secondary | ICD-10-CM

## 2024-03-08 ENCOUNTER — Ambulatory Visit (HOSPITAL_COMMUNITY)

## 2024-03-08 NOTE — Progress Notes (Signed)
 BH MD/PA/NP OP Progress Note  03/08/2024 1:40 PM Rachel Everett  MRN:  [id number]  Chief Complaint: No chief complaint on file.  HPI: Rachel Everett 63 year old female presents for weekly follow-up while attending partial hospitalization program.  Diagnosed with bipolar depression disorder, major depressive disorder and generalized anxiety disorder.  Was initiated on Lamictal  and Zyprexa  while hospitalized inpatient facility at [facility].  Reports she has not been able to tolerate Zyprexa  2.5 mg twice daily, patient stated that medication was too sedating.    Advised to take Zyprexa  5 mg nightly and continue Lamictal  50 mg daily.  Reports experiencing some dizziness which has since resolved.  Reports she enjoys prescription setting for structure and learning new coping skills.  States has been helpful for her mental health to leave the family home.  Reports stressors related to caring for her daughter who is also struggling with substance abuse and 5 dogs newly adopted.  She appears future and goal oriented as she reports plans to drive down to the beach with her daughter this weekend for the holidays.  Hopefully this to get some time to relax and unwind.  No concerns related to suicidal or homicidal ideations.  Denies auditory or visual hallucinations.  Has a initial appointment with [facility] psychiatrist [facility].  No other concerns noted at this visit.  Will continue to monitor symptoms.  Support, encouragement and  reassurance was provided.   Visit Diagnosis:    ICD-10-CM   1. Bipolar disorder, current episode depressed, severe, with psychotic features (HCC)  F31.5     2. PTSD (post-traumatic stress disorder)  F43.10       Past Psychiatric History: HO bipolar disorder, posttraumatic stress disorder major depressive disorder, generalized anxiety disorder.  Reports history related to autism spectrum disorder.  she reported multiple previous inpatient admissions  Past Medical  History:  Past Medical History:  Diagnosis Date   Anxiety    Bipolar 1 disorder (HCC)    Depression    GERD (gastroesophageal reflux disease)    Hyperlipidemia    Hypothyroidism    SVD (spontaneous vaginal delivery)    x 1   Thyroid  disease     Past Surgical History:  Procedure Laterality Date   ANKLE SURGERY     right    ANTERIOR CRUCIATE LIGAMENT REPAIR Left    BACK SURGERY     L5-S1   CHOLECYSTECTOMY     FRACTURE SURGERY Left    elbow   TENDON REPAIR Right    angle   TONSILLECTOMY      Family Psychiatric History:   Family History:  Family History  Problem Relation Age of Onset   Anxiety disorder Mother    Depression Mother    Breast cancer Mother 26 - 85       recur 77s   Cancer Mother    Depression Brother    Anxiety disorder Brother    Alcohol abuse Maternal Grandfather    Breast cancer Maternal Grandmother 38 - 68   Cancer Maternal Grandmother    Colon cancer Neg Hx    Esophageal cancer Neg Hx    Rectal cancer Neg Hx    Stomach cancer Neg Hx    BRCA 1/2 Neg Hx     Social History:  Social History   Socioeconomic History   Marital status: Single    Spouse name: Not on file   Number of children: 1   Years of education: Not on file   Highest education level:  Bachelor's degree (e.g., BA, AB, BS)  Occupational History   Not on file  Tobacco Use   Smoking status: Former    Current packs/day: 0.00    Average packs/day: 0.5 packs/day for 1 year (0.5 ttl pk-yrs)    Types: Cigarettes    Start date: 10/04/2014    Quit date: 10/04/2015    Years since quitting: 8.4   Smokeless tobacco: Never  Vaping Use   Vaping status: Former  Substance and Sexual Activity   Alcohol use: Yes    Alcohol/week: 0.0 standard drinks of alcohol    Comment: rare   Drug use: Not Currently    Types: Marijuana   Sexual activity: Yes    Birth control/protection: Post-menopausal  Other Topics Concern   Not on file  Social History Narrative   Not on file   Social Drivers  of Health   Tobacco Use: Medium Risk (02/25/2024)   Patient History    Smoking Tobacco Use: Former    Smokeless Tobacco Use: Never    Passive Exposure: Not on file  Financial Resource Strain: High Risk (06/26/2023)   Overall Financial Resource Strain (CARDIA)    Difficulty of Paying Living Expenses: Very hard  Food Insecurity: Food Insecurity Present (02/11/2024)   Epic    Worried About Programme Researcher, Broadcasting/film/video in the Last Year: Often true    Ran Out of Food in the Last Year: Often true  Transportation Needs: Unmet Transportation Needs (02/11/2024)   Epic    Lack of Transportation (Medical): Yes    Lack of Transportation (Non-Medical): Yes  Physical Activity: Inactive (06/26/2023)   Exercise Vital Sign    Days of Exercise per Week: 0 days    Minutes of Exercise per Session: 0 min  Stress: Stress Concern Present (06/26/2023)   [employer] of Occupational Health - Occupational Stress Questionnaire    Feeling of Stress : Rather much  Social Connections: Socially Isolated (02/11/2024)   Social Connection and Isolation Panel    Frequency of Communication with Friends and Family: Never    Frequency of Social Gatherings with Friends and Family: Never    Attends Religious Services: Never    Database Administrator or Organizations: Yes    Attends Banker Meetings: Never    Marital Status: Never married  Depression (PHQ2-9): High Risk (02/25/2024)   Depression (PHQ2-9)    PHQ-2 Score: 19  Alcohol Screen: Low Risk (02/11/2024)   Alcohol Screen    Last Alcohol Screening Score (AUDIT): 0  Housing: Low Risk (02/11/2024)   Epic    Unable to Pay for Housing in the Last Year: No    Number of Times Moved in the Last Year: 0    Homeless in the Last Year: No  Utilities: Not At Risk (02/11/2024)   Epic    Threatened with loss of utilities: No  Health Literacy: Adequate Health Literacy (05/13/2023)   B1300 Health Literacy    Frequency of need for help with medical instructions:  Rarely    Allergies: Allergies[1]  Metabolic Disorder Labs: Lab Results  Component Value Date   HGBA1C 5.4 02/11/2024   MPG 108 02/11/2024   No results found for: PROLACTIN Lab Results  Component Value Date   CHOL 145 02/11/2024   TRIG 78 02/11/2024   HDL 59 02/11/2024   CHOLHDL 2.5 02/11/2024   VLDL 16 02/11/2024   LDLCALC 70 02/11/2024   LDLCALC 86 05/13/2023   Lab Results  Component Value Date   TSH 1.606  02/11/2024   TSH 0.025 (L) 06/26/2023    Therapeutic Level Labs: Lab Results  Component Value Date   LITHIUM  0.92 12/20/2022   LITHIUM  1.1 01/21/2021   No results found for: VALPROATE No results found for: CBMZ  Current Medications: Current Outpatient Medications  Medication Sig Dispense Refill   acetaminophen  (TYLENOL ) 500 MG tablet Take 2 tablets (1,000 mg total) by mouth every 6 (six) hours as needed for mild pain (pain score 1-3). 120 tablet 0   atorvastatin  (LIPITOR) 20 MG tablet Take 1 tablet (20 mg total) by mouth daily. 90 tablet 1   lamoTRIgine  (LAMICTAL ) 25 MG tablet Take 2 tablets (50 mg total) by mouth 2 (two) times daily. 120 tablet 0   levothyroxine  (SYNTHROID ) 88 MCG tablet Take 1 tablet (88 mcg total) by mouth daily before breakfast. 90 tablet 0   naproxen  (NAPROSYN ) 250 MG tablet Take 1.5 tablets (375 mg total) by mouth 2 (two) times daily with a meal. (Patient not taking: Reported on 02/25/2024) 90 tablet 0   OLANZapine  (ZYPREXA ) 2.5 MG tablet Take 1 tablet (2.5 mg total) by mouth 2 (two) times daily. 60 tablet 0   OLANZapine  (ZYPREXA ) 5 MG tablet Take 1 tablet (5 mg total) by mouth at bedtime. 30 tablet 0   venlafaxine  XR (EFFEXOR -XR) 150 MG 24 hr capsule Take 1 capsule (150 mg total) by mouth daily with breakfast. 30 capsule 0   No current facility-administered medications for this visit.     Musculoskeletal: Strength & Muscle Tone: within normal limits Gait & Station: normal Patient leans: N/A  Psychiatric Specialty  Exam: Review of Systems  There were no vitals taken for this visit.There is no height or weight on file to calculate BMI.  General Appearance: Casual  Eye Contact:  Good  Speech:  Clear and Coherent  Volume:  Normal  Mood:  Anxious and Depressed  Affect:  Congruent  Thought Process:  Coherent  Orientation:  Full (Time, Place, and Person)  Thought Content: Logical   Suicidal Thoughts:  No  Homicidal Thoughts:  No  Memory:  Immediate;   Good Recent;   Good  Judgement:  Good  Insight:  Good  Psychomotor Activity:  Normal  Concentration:  Concentration: Good  Recall:  Good  Fund of Knowledge: Good  Language: Good  Akathisia:  No  Handed:  Right  AIMS (if indicated): not done  Assets:  Communication Skills Desire for Improvement  ADL's:  Intact  Cognition: WNL  Sleep:  Good   Screenings: AUDIT    Flowsheet Row Admission (Discharged) from 02/11/2024 in [facility] BE[facility]AVIORAL [facility]IC[facility]  Alcohol Use Disorder Identification Test Final Score (AUDIT) 0   GAD-7    Flowsheet Row Office Visit from 05/13/2023 in [facility] [place] - A Dept Of [place]. [facility] Office Visit from 01/21/2021 in [facility] [place] - A Dept Of Jolynn DEL. [facility] Office Visit from 08/28/2017 in [facility] Health Comm [facility] [place] - A Dept Of Jolynn DEL. [facility] Office Visit from 04/24/2017 in [facility] [place] - A Dept Of [place] DEL. [facility] Office Visit from 03/24/2017 in [facility] [place] - A Dept Of J[place]ynn DEL. [facility]  Total GAD-7 Score 19 5 14 12 16    PHQ2-9    Flowsheet Row Counselor from 02/25/2024 in [facility] Office Visit from 05/13/2023 in [facility] - A Dept Of [place].  [facility] Office Visit from 01/21/2021 in [facility] [place] - A Dept Of J[place] DEL. [facility] Office Visit from 08/28/2017 in [facility]  Comm Health [place] - A Dept Of Jolynn DEL. [facility] Office Visit from 04/24/2017 in [facility] [place] - A Dept Of Jol[place] DEL. [facility]  PHQ-2 Total Score 3 2 2 2 2   PHQ-9 Total Score 19 18 8 14 13    Flowsheet Row Counselor from 02/25/2024 in [facility] Counselor from 02/22/2024 in [facility] Admission (Discharged) from 02/11/2024 in [facility] BEHAVIORAL MEDICINE  C-SSRS RISK CATEGORY Error: Question 2 not populated High Risk High Risk     Assessment and Plan:  Continue partial outpatient hospitalization programming - Continue medications as directed - Keep new patient appointment with neuropsychiatry  Collaboration of Care: Collaboration of Care: Medication Management AEB Effexor , Zyprexa , Lamictal  and hydroxyzine  as needed  Patient/Guardian was advised Release of Information must be obtained prior to any record release in order to collaborate their care with an outside provider. Patient/Guardian was advised if they have not already done so to contact the registration department to sign all necessary forms in order for us  to release information regarding their care.   Consent: Patient/Guardian gives verbal consent for treatment and assignment of benefits for services provided during this visit. Patient/Guardian expressed understanding and agreed to proceed.    Staci LOISE Kerns, NP 03/08/2024, 1:40 PM     [1] No Known Allergies

## 2024-03-11 ENCOUNTER — Ambulatory Visit (HOSPITAL_COMMUNITY)

## 2024-03-11 ENCOUNTER — Telehealth (HOSPITAL_COMMUNITY): Payer: Self-pay | Admitting: Licensed Clinical Social Worker

## 2024-03-11 ENCOUNTER — Ambulatory Visit: Payer: Self-pay

## 2024-03-11 NOTE — Telephone Encounter (Signed)
 FYI Only or Action Required?: FYI only for provider: appointment scheduled on 03/11/24.  Patient was last seen in primary care on 06/26/2023 by Antonio Meth, Jamee SAUNDERS, DO.  Called Nurse Triage reporting Fatigue.  Symptoms began couple weeks ago.  Interventions attempted: Prescription medications: synthroid  .  Symptoms are: gradually worsening.  Triage Disposition: See Within 3 Days in Office (overriding See HCP Within 4 Hours (Or PCP Triage))  Patient/caregiver understands and will follow disposition?: Yes              Copied from CRM #[id number]. Topic: Clinical - Red Word Triage >> Mar 11, 2024  3:53 PM Mia F wrote: Red Word that prompted transfer to Nurse Triage: Weak, cannot stand or walk for long, She said she was feeling lethargic but said that may be toomuch to say. She also mentioned that she has been winded but changed that as well. Reason for Disposition  [1] MODERATE weakness (e.g., interferes with work, school, normal activities) AND [2] cause unknown  (Exceptions: Weakness from acute minor illness or poor fluid intake; weakness is chronic and not worse.)  Answer Assessment - Initial Assessment Questions 1. DESCRIPTION: Describe how you are feeling.     Fatigue, weakness, lethargic.  2. SEVERITY: How bad is it?  Can you stand and walk?     Patient is able to stand and walk on her own but states she can't go for more than 5 minutes before needing to rest.  3. ONSET: When did these symptoms begin? (e.g., hours, days, weeks, months)     Couple weeks.  4. CAUSE: What do you think is causing the weakness or fatigue? (e.g., not drinking enough fluids, medical problem, trouble sleeping)     She states she knows this is her thyroid  causing her symptoms. She states she was on synthroid  100mcg and was decreased to 88mcg and she states the dosage needs to be increased.  5. NEW MEDICINES:  Have you started on any new medicines recently? (e.g., opioid pain  medicines, benzodiazepines, muscle relaxants, antidepressants, antihistamines, neuroleptics, beta blockers)     No new medications in the past month.  6. OTHER SYMPTOMS: Do you have any other symptoms? (e.g., chest pain, fever, cough, SOB, vomiting, diarrhea, bleeding, other areas of pain)     No fever, cough, SOB, bleeding, unilateral numbness or weakness. She states when she stands her heart rate feels fast but is recorded at 80s. HR 60s at rest.  Protocols used: Weakness (Generalized) and Fatigue-A-AH

## 2024-03-11 NOTE — Telephone Encounter (Signed)
 See call log

## 2024-03-14 ENCOUNTER — Ambulatory Visit (HOSPITAL_COMMUNITY)

## 2024-03-14 ENCOUNTER — Ambulatory Visit: Payer: Self-pay | Admitting: Nurse Practitioner

## 2024-03-15 ENCOUNTER — Ambulatory Visit (HOSPITAL_COMMUNITY)

## 2024-03-15 ENCOUNTER — Telehealth (HOSPITAL_COMMUNITY): Payer: Self-pay | Admitting: Psychiatry

## 2024-03-15 NOTE — Telephone Encounter (Signed)
 D: Pt will be stepping down to virtual MH-IOP from PHP on 03-21-24.  A:  Placed call to orient pt but there was no answer.  Left case manager's name and phone # if she has any questions/concerns before starting group on 03-21-24.

## 2024-03-16 ENCOUNTER — Ambulatory Visit (HOSPITAL_COMMUNITY)

## 2024-03-16 NOTE — Psych (Signed)
 " [facility] BH PHP THERAPIST PROGRESS NOTE  Grizel Vesely [id number]  Session Time: 9:00 - 10:00  Participation Level: Active  Behavioral Response: CasualAlertDepressed  Type of Therapy: Group Therapy  Treatment Goals addressed: Coping  Progress Towards Goals: Initial  Interventions: CBT, DBT, Supportive, and Reframing  Summary: Rachel Everett is a 63 y.o. female who presents with depression symptoms.  Clinician led check-in regarding current stressors and situation, and review of patient completed daily inventory. Clinician utilized active listening and empathetic response and validated patient emotions. Clinician facilitated processing group on pertinent issues.?   Summary: Patient arrived within time allowed. Patient rates their depression at a 2 and anxiety at a 8 on a scale of 1-10 with 10 being high. Pt reports they slept 7.5 hours last night and ate 3x yesterday. Pt reports increased stress over the last 4 days d/t daughter relapsing on alcohol on Saturday. Pt states trying her best to take care of daughter but struggling with pt's own limitations. Pt reports struggling with managing the time after group because daughter expects it all to be spent with her and pt would like rest and time to herself. Cln coaches pt on comminication strategies and pt states willingness to try them. Pt reports continued feelings of being overwhelmed in her living environment due to the backlog of cleaning to do. Pt states she has been driving herself to group and is noticing less anxiety during the drive and feeling positive about increased independence. Pt able to process.?Pt engaged in discussion.?        Session Time: 10:00 am - 11:00 am  Participation Level: Active  Behavioral Response: CasualAlertAnxious and Depressed  Type of Therapy: Group Therapy  Treatment Goals addressed: Coping  Progress Towards Goals: Initial  Interventions: CBT, DBT, Solution Focused, Strength-based, Supportive, and  Reframing  Therapist Response: Cln led discussion on work arounds or finding intermediate solutions while we are in the process of working on a final solution. Group members shared situations in which they are struggling with an issue that they are unable to fix quickly. Cln encouraged pt's to consider ways to make things doable and not let perfect stand in the way.    Summary: Pt engaged in discussion and is able to connect with topic.           Session Time: 11:00 am - 12:00 pm   Participation Level: Active   Behavioral Response: CasualAlertAnxious   Type of Therapy: Group Therapy   Treatment Goals addressed: Coping   Progress Towards Goals: Progressing   Interventions: CBT, DBT, Solution Focused, Strength-based, Supportive, and Reframing   Therapist Response: Cln led discussion on CBT reframing and how to look for alternative possibilities to decrease distress from negative thinking. Cln utilized the CBT triangle to aid discussion. Group members shared difficult situations and worked with group to reframe the negative thinking present.   Summary: Pt engaged in reframing practice.            Session Time: 12:00 pm - 12:30 pm   Participation Level: Active   Behavioral Response: CasualAlertAnxious   Type of Therapy: Group Therapy   Treatment Goals addressed: Coping   Progress Towards Goals: Progressing   Interventions: Psychologist, Occupational, Supportive   Therapist Response: Reflection Group: Patients encouraged to practice skills and interpersonal techniques or work on mindfulness and relaxation techniques. The importance of self-care and making skills part of a routine to increase usage were stressed.   Summary: Patient engaged and participated appropriately.  Session Time: 12:30 pm - 1:30 pm   Participation Level: Active   Behavioral Response: CasualAlertAnxious   Type of Therapy: Group Therapy   Treatment Goals addressed: Coping    Progress Towards Goals: Progressing   Interventions: CBT, DBT, Solution Focused, Strength-based, Supportive, and Reframing   Therapist Response: Cln led discussion on trust and how to establish healthy trust. Group discussed common missteps such as disclosing personal information too soon, ignoring behaviors being displayed, inaccurately defining the relationship, and not giving people credit. Cln utilized CBT thought challenging principles to encourage pt's to rely on evidence versus feelings. Group members shared struggles they experience with trust.    Summary: Pt engaged in discussion and is able to identify  areas of improvement in their trust process.            Session Time: 1:30 pm - 1:45 pm   Participation Level: Active   Behavioral Response: CasualAlertAnxious   Type of Therapy: Group Therapy   Treatment Goals addressed: Coping   Progress Towards Goals: Progressing   Interventions: CBT, DBT, Solution Focused, Strength-based, Supportive, and Reframing   Therapist Response: 1:30 pm - 1:45 pm: Clinician led check-out. Clinician assessed for immediate needs, medication compliance and efficacy, and safety concerns?   Summary: 1:30 pm - 1:45 pm: At check-out, patient reports no immediate concerns. Patient demonstrates progress as evidenced by engagement and responsiveness to treatment. Patient denies SI/HI/self-harm thoughts at the end of group.    Suicidal/Homicidal: Nowithout intent/plan  Plan: Pt will continue in PHP while working to decrease depression symptoms, increase daily functioning, and increase ability to manage symptoms in a healthy manner.   Collaboration of Care: Medication Management AEB T Lewis, NP  Patient/Guardian was advised Release of Information must be obtained prior to any record release in order to collaborate their care with an outside provider. Patient/Guardian was advised if they have not already done so to contact the registration department  to sign all necessary forms in order for us  to release information regarding their care.   Consent: Patient/Guardian gives verbal consent for treatment and assignment of benefits for services provided during this visit. Patient/Guardian expressed understanding and agreed to proceed.   Diagnosis: Bipolar disorder, current episode depressed, severe, with psychotic features (HCC) [F31.5]    1. Bipolar disorder, current episode depressed, severe, with psychotic features (HCC)   2. PTSD (post-traumatic stress disorder)       Randall Bastos, LCSW   "

## 2024-03-16 NOTE — Psych (Signed)
 " [facility] BH PHP THERAPIST PROGRESS NOTE  Axelle Szwed [id number]  Session Time: 9:00 - 10:00  Participation Level: Active  Behavioral Response: CasualAlertDepressed  Type of Therapy: Group Therapy  Treatment Goals addressed: Coping  Progress Towards Goals: Initial  Interventions: CBT, DBT, Supportive, and Reframing  Summary: Rachel Everett is a 63 y.o. female who presents with depression symptoms.  Clinician led check-in regarding current stressors and situation, and review of patient completed daily inventory. Clinician utilized active listening and empathetic response and validated patient emotions. Clinician facilitated processing group on pertinent issues.?   Summary: Patient arrived within time allowed. Patient rates their depression at a 3 and anxiety at a 7 on a scale of 1-10 with 10 being high. Pt reports they slept 4 hours last night and ate 3 meals yesterday. Pt reports yesterday was difficult. Pt reports her daughter is in recovery from alcohol and a lot of stuff is coming to the surface and she often tells pt all the ways I f*cked up as a mom. Pt shares frustration and conflict because she knows now that she is autistic and the issues her daughter shares are struggles pt has due to autism and not knowing about it. Pt states feeling she can't be enough and feeling like a failure. Pt reports increase in anxiety and feeling overwhelmed. Pt states she took a nap later in the afternoon which negatiely impacted her sleep.  Pt able to process.?Pt engaged in discussion.?        Session Time: 10:00 am - 11:00 am  Participation Level: Active  Behavioral Response: CasualAlertAnxious and Depressed  Type of Therapy: Group Therapy  Treatment Goals addressed: Coping  Progress Towards Goals: Initial  Interventions: CBT, DBT, Solution Focused, Strength-based, Supportive, and Reframing  Therapist Response: Cln led discussion on HALT (hungry, angry, lonely, tired) acronym, which  encourages check-in with ourselves when feeling emotional. Cln encouraged pt's to use HALT as a first check-in step to address basic vulnerabilities before reacting. Group members discussed ways in which they react to being angry, lonely, and tired, and work to determine times in which utilizing HALT would have diverted a bigger issue.    Summary: Pt engaged in discussion and reports understanding of HALT and willingness to utilize it.           Session Time: 11:00 am - 12:00 pm   Participation Level: Active   Behavioral Response: CasualAlertAnxious   Type of Therapy: Group Therapy   Treatment Goals addressed: Coping   Progress Towards Goals: Progressing   Interventions: CBT, DBT, Solution Focused, Strength-based, Supportive, and Reframing   Therapist Response: Cln led discussion on communicating our struggles with our support team. Cln introduced the concept of giving disclosures to the people close to us  regarding the way we act in specific situations or the way we process certain stimuli. Group members discussed ways to provide this road map to our brain and in what situations this may be helpful to them.  Summary: Pt engaged in discussion and is able to determine situations in which providing a disclosure can be helpful and practiced doing so.          Session Time: 12:00 pm - 12:30 pm   Participation Level: Active   Behavioral Response: CasualAlertAnxious   Type of Therapy: Group Therapy   Treatment Goals addressed: Coping   Progress Towards Goals: Progressing   Interventions: Psychologist, Occupational, Supportive   Therapist Response: Reflection Group: Patients encouraged to practice skills and interpersonal techniques or work  on mindfulness and relaxation techniques. The importance of self-care and making skills part of a routine to increase usage were stressed.   Summary: Patient engaged and participated appropriately.         Session Time: 12:30 pm - 1:30  pm   Participation Level: Active   Behavioral Response: CasualAlertAnxious   Type of Therapy: Group Therapy   Treatment Goals addressed: Coping   Progress Towards Goals: Progressing   Interventions: OT    Therapist Response: Occupational therapy group led by E. Hollan, OT.    Summary: Pt engaged. See note           Session Time: 1:30 pm - 1:45 pm   Participation Level: Active   Behavioral Response: CasualAlertAnxious   Type of Therapy: Group Therapy   Treatment Goals addressed: Coping   Progress Towards Goals: Progressing   Interventions: CBT, DBT, Solution Focused, Strength-based, Supportive, and Reframing   Therapist Response: 1:30 pm - 1:45 pm: Clinician led check-out. Clinician assessed for immediate needs, medication compliance and efficacy, and safety concerns?   Summary: 1:30 pm - 1:45 pm: At check-out, patient reports no immediate concerns. Patient demonstrates progress as evidenced by engagement and responsiveness to treatment. Patient denies SI/HI/self-harm thoughts at the end of group.    Suicidal/Homicidal: Nowithout intent/plan  Plan: Pt will continue in PHP while working to decrease depression symptoms, increase daily functioning, and increase ability to manage symptoms in a healthy manner.   Collaboration of Care: Medication Management AEB T Lewis, NP  Patient/Guardian was advised Release of Information must be obtained prior to any record release in order to collaborate their care with an outside provider. Patient/Guardian was advised if they have not already done so to contact the registration department to sign all necessary forms in order for us  to release information regarding their care.   Consent: Patient/Guardian gives verbal consent for treatment and assignment of benefits for services provided during this visit. Patient/Guardian expressed understanding and agreed to proceed.   Diagnosis: Bipolar disorder, current episode depressed, severe,  with psychotic features (HCC) [F31.5]    1. Bipolar disorder, current episode depressed, severe, with psychotic features (HCC)   2. PTSD (post-traumatic stress disorder)       Randall Bastos, LCSW   "

## 2024-03-16 NOTE — Psych (Signed)
 " [facility] BH PHP THERAPIST PROGRESS NOTE  Rachel Everett [id number]  Session Time: 9:00 - 10:00  Participation Level: Active  Behavioral Response: CasualAlertDepressed  Type of Therapy: Group Therapy  Treatment Goals addressed: Coping  Progress Towards Goals: Progressing  Interventions: CBT, DBT, Supportive, and Reframing  Summary: Rachel Everett is a 63 y.o. female who presents with depression symptoms.  Clinician led check-in regarding current stressors and situation, and review of patient completed daily inventory. Clinician utilized active listening and empathetic response and validated patient emotions. Clinician facilitated processing group on pertinent issues.?   Summary: Patient arrived within time allowed. Patient rates their depression at a 3 and anxiety at a 6 on a scale of 1-10 with 10 being high. Pt reports they slept 7 hours last night and ate 2x yesterday. Pt reports she is having a good morning and is glad to be in group. Pt shares the weekend was god-awful as she continued to recover from illness and her daughter was having a volatile weekend. Pt reports they argued throughout the weekend and her daughter blames pt for not doing enough to make holidays special. Pt shares that she achieved 30 days sobriety from marijuana on Saturday and is feeling healthier and proud of herself. Pt able to process.?Pt engaged in discussion.?        Session Time: 10:00 am - 11:00 am  Participation Level: Active  Behavioral Response: CasualAlertAnxious and Depressed  Type of Therapy: Group Therapy  Treatment Goals addressed: Coping  Progress Towards Goals: Initial  Interventions: CBT, DBT, Solution Focused, Strength-based, Supportive, and Reframing  Therapist Response: Cln led processing group for pt's current struggles. Group members shared stressors and provided support and feedback. Cln brought in topics of boundaries, healthy relationships, and unhealthy thought processes to  inform discussion.    Summary: Pt able to process and provide support to group.            Session Time: 11:00 am - 12:00 pm   Participation Level: Active   Behavioral Response: CasualAlertAnxious   Type of Therapy: Group Therapy   Treatment Goals addressed: Coping   Progress Towards Goals: Progressing   Interventions: CBT, DBT, Solution Focused, Strength-based, Supportive, and Reframing   Therapist Response: Cln led discussion on the role affirmations and/or mantras can play in retraining CBT distorted thoughts and core beliefs. Cln reviewed the importance of repetition and utilizing the affirmations even when they don't feel true. Group members discussed different mantras they have used/could use to help retrain negative thoughts.    Summary: Pt engaged in discussion and reports understanding.            Session Time: 12:00 pm - 12:30 pm   Participation Level: Active   Behavioral Response: CasualAlertAnxious   Type of Therapy: Group Therapy   Treatment Goals addressed: Coping   Progress Towards Goals: Progressing   Interventions: Psychologist, Occupational, Supportive   Therapist Response: Reflection Group: Patients encouraged to practice skills and interpersonal techniques or work on mindfulness and relaxation techniques. The importance of self-care and making skills part of a routine to increase usage were stressed.   Summary: Patient engaged and participated appropriately.         Session Time: 12:30 pm - 1:30 pm   Participation Level: Active   Behavioral Response: CasualAlertAnxious   Type of Therapy: Group Therapy   Treatment Goals addressed: Coping   Progress Towards Goals: Progressing   Interventions: CBT, DBT, Solution Focused, Strength-based, Supportive, and Reframing   Therapist Response: Cln  continued discussion on CBT and introduced the cognitive distortion: mind reading. Cln worked with group members to identify examples of mind reading and  the consequences that can come. Group shared ways in which mind reading has been an issue for them and barriers to working on it.    Summary: Pt engaged in discussion and is able to identify examples from their own life.            Session Time: 1:30 pm - 1:45 pm   Participation Level: Active   Behavioral Response: CasualAlertAnxious   Type of Therapy: Group Therapy   Treatment Goals addressed: Coping   Progress Towards Goals: Progressing   Interventions: CBT, DBT, Solution Focused, Strength-based, Supportive, and Reframing   Therapist Response: 1:30 pm - 1:45 pm: Clinician led check-out. Clinician assessed for immediate needs, medication compliance and efficacy, and safety concerns?   Summary: 1:30 pm - 1:45 pm: At check-out, patient reports no immediate concerns. Patient demonstrates progress as evidenced by engagement and responsiveness to treatment. Patient denies SI/HI/self-harm thoughts at the end of group    Suicidal/Homicidal: Nowithout intent/plan  Plan: Pt will continue in PHP while working to decrease depression symptoms, increase daily functioning, and increase ability to manage symptoms in a healthy manner.   Collaboration of Care: Medication Management AEB T Lewis, NP  Patient/Guardian was advised Release of Information must be obtained prior to any record release in order to collaborate their care with an outside provider. Patient/Guardian was advised if they have not already done so to contact the registration department to sign all necessary forms in order for us  to release information regarding their care.   Consent: Patient/Guardian gives verbal consent for treatment and assignment of benefits for services provided during this visit. Patient/Guardian expressed understanding and agreed to proceed.   Diagnosis: Bipolar disorder, current episode depressed, severe, with psychotic features (HCC) [F31.5]    1. Bipolar disorder, current episode depressed, severe,  with psychotic features (HCC)   2. PTSD (post-traumatic stress disorder)       Randall Bastos, LCSW   "

## 2024-03-16 NOTE — Psych (Signed)
 " [facility] PHP THERAPIST PROGRESS NOTE  Rachel Everett [id number]  Session Time: 9:00 - 10:00  Participation Level: Active  Behavioral Response: CasualAlertDepressed  Type of Therapy: Group Therapy  Treatment Goals addressed: Coping  Progress Towards Goals: Initial  Interventions: CBT, DBT, Supportive, and Reframing  Summary: Rachel Everett is a 63 y.o. female who presents with depression symptoms.  Clinician led check-in regarding current stressors and situation, and review of patient completed daily inventory. Clinician utilized active listening and empathetic response and validated patient emotions. Clinician facilitated processing group on pertinent issues.?   Summary: Patient arrived within time allowed. Patient rates their depression at a 3 and anxiety at a 10 on a scale of 1-10 with 10 being high. Pt reports they slept 6 hours last night and ate 3 meals yesterday. Pt reports yesterday was brilliant and she spent the day by herself, experienced no stress with her daughter/roommate, and texted with a friend. Pt shares that her living environment is stressful due to 5 dogs and a tense relationship with her daughter. Pt reports daughter blames pt for a lot and is often telling pt ways in which pt messed up as a mother. Pt reports she showered and washed her hair yesterday for the first time in weeks/months. Pt states struggling with increased anxiety around new people so today is difficult for her. Pt able to process.?Pt engaged in discussion.?        Session Time: 10:00 am - 11:00 am  Participation Level: Active  Behavioral Response: CasualAlertAnxious and Depressed  Type of Therapy: Group Therapy  Treatment Goals addressed: Coping  Progress Towards Goals: Initial  Interventions: CBT, DBT, Solution Focused, Strength-based, Supportive, and Reframing  Therapist Response: Cln led discussion on feelings and the role they play for our lives. Cln contextualized feelings as a  warning system that brings attention to areas of our lives that need focus. Group members discussed how to pay attention to what the feeling is telling us  versus reacting to unpleasant aspects of a feeling.    Summary:  Pt engaged in discussion and reports understanding.            Session Time: 11:00 am - 12:00 pm   Participation Level: Active   Behavioral Response: CasualAlertAnxious   Type of Therapy: Group Therapy   Treatment Goals addressed: Coping   Progress Towards Goals: Progressing   Interventions: CBT, DBT, Solution Focused, Strength-based, Supportive, and Reframing   Therapist Response: Cln led discussion on extending grace and kindness to ourselves. Group discussed the messages they give themselves and how it impacts them. Cln discussed the best friend test as a way to calibrate whether we are being fair to ourselves or not and encouraged pt's to consider, would I believe this/say this about someone I cared about?      Summary: Pt engaged in discussion and shares difficulties with being kind to herself.            Session Time: 12:00 pm - 12:30 pm   Participation Level: Active   Behavioral Response: CasualAlertAnxious   Type of Therapy: Group Therapy   Treatment Goals addressed: Coping   Progress Towards Goals: Progressing   Interventions: Psychologist, Occupational, Supportive   Therapist Response: Reflection Group: Patients encouraged to practice skills and interpersonal techniques or work on mindfulness and relaxation techniques. The importance of self-care and making skills part of a routine to increase usage were stressed.   Summary: Patient engaged and participated appropriately.  Session Time: 12:30 pm - 1:30 pm   Participation Level: Active   Behavioral Response: CasualAlertAnxious   Type of Therapy: Group Therapy   Treatment Goals addressed: Coping   Progress Towards Goals: Progressing   Interventions: OT    Therapist Response:  Occupational therapy group led by E. Hollan, OT.    Summary: Pt engaged. See note           Session Time: 1:30 pm - 1:45 pm   Participation Level: Active   Behavioral Response: CasualAlertAnxious   Type of Therapy: Group Therapy   Treatment Goals addressed: Coping   Progress Towards Goals: Progressing   Interventions: CBT, DBT, Solution Focused, Strength-based, Supportive, and Reframing   Therapist Response: 1:30 pm - 1:45 pm: Clinician led check-out. Clinician assessed for immediate needs, medication compliance and efficacy, and safety concerns?   Summary: 1:30 pm - 1:45 pm: At check-out, patient reports no immediate concerns. Patient demonstrates progress as evidenced by engagement and responsiveness to treatment. Patient denies SI/HI/self-harm thoughts at the end of group.    Suicidal/Homicidal: Nowithout intent/plan  Plan: Pt will continue in PHP while working to decrease depression symptoms, increase daily functioning, and increase ability to manage symptoms in a healthy manner.   Collaboration of Care: Medication Management AEB T Lewis, NP  Patient/Guardian was advised Release of Information must be obtained prior to any record release in order to collaborate their care with an outside provider. Patient/Guardian was advised if they have not already done so to contact the registration department to sign all necessary forms in order for us  to release information regarding their care.   Consent: Patient/Guardian gives verbal consent for treatment and assignment of benefits for services provided during this visit. Patient/Guardian expressed understanding and agreed to proceed.   Diagnosis: Bipolar disorder, current episode depressed, severe, with psychotic features (HCC) [F31.5]    1. Bipolar disorder, current episode depressed, severe, with psychotic features (HCC)   2. PTSD (post-traumatic stress disorder)   3. Marijuana abuse       Randall Bastos, LCSW   "

## 2024-03-18 ENCOUNTER — Other Ambulatory Visit: Payer: Self-pay | Admitting: Family Medicine

## 2024-03-18 ENCOUNTER — Ambulatory Visit (HOSPITAL_COMMUNITY)

## 2024-03-18 DIAGNOSIS — E785 Hyperlipidemia, unspecified: Secondary | ICD-10-CM

## 2024-03-21 ENCOUNTER — Telehealth (HOSPITAL_COMMUNITY): Payer: Self-pay | Admitting: Psychiatry

## 2024-03-21 ENCOUNTER — Ambulatory Visit (HOSPITAL_COMMUNITY)

## 2024-03-21 NOTE — Telephone Encounter (Signed)
 D:  Placed calls to patient earlier this morning d/t her no showing for virtual MH-IOP.  A:  Pt was scheduled to start virtual MH-IOP today.  Will send another invite tomorrow.  Inform treatment team and Randall Bastos, LCSW.

## 2024-03-22 ENCOUNTER — Telehealth (HOSPITAL_COMMUNITY): Payer: Self-pay | Admitting: Psychiatry

## 2024-03-22 ENCOUNTER — Ambulatory Visit (HOSPITAL_COMMUNITY)

## 2024-03-22 ENCOUNTER — Other Ambulatory Visit (HOSPITAL_COMMUNITY): Admitting: Psychiatry

## 2024-03-22 NOTE — Telephone Encounter (Signed)
 D:  Pt was scheduled to start in virtual MH-IOP, but she no showed yesterday and today.  A:  Left vm requesting pt to call the case manager back to inform her if she is still interested in group or not.  Inform the treatment team and Jenny Edminison, LCSW.

## 2024-03-23 ENCOUNTER — Ambulatory Visit (HOSPITAL_COMMUNITY)

## 2024-03-23 ENCOUNTER — Other Ambulatory Visit (HOSPITAL_COMMUNITY)

## 2024-03-24 ENCOUNTER — Telehealth (HOSPITAL_COMMUNITY): Payer: Self-pay | Admitting: Psychiatry

## 2024-03-24 ENCOUNTER — Other Ambulatory Visit (HOSPITAL_COMMUNITY)

## 2024-03-24 NOTE — Telephone Encounter (Signed)
 D: Randall Rude, LCSW informed the MH-IOP Case Mgr that pt reached out to her and stated that she can't do virtual MH-IOP at this time.  A:  Informed treatment team.

## 2024-03-25 ENCOUNTER — Other Ambulatory Visit (HOSPITAL_COMMUNITY)

## 2024-03-28 ENCOUNTER — Other Ambulatory Visit (HOSPITAL_COMMUNITY)

## 2024-03-29 ENCOUNTER — Other Ambulatory Visit: Payer: Self-pay | Admitting: Family Medicine

## 2024-03-29 ENCOUNTER — Other Ambulatory Visit (HOSPITAL_COMMUNITY)

## 2024-03-29 DIAGNOSIS — E785 Hyperlipidemia, unspecified: Secondary | ICD-10-CM

## 2024-03-30 ENCOUNTER — Other Ambulatory Visit (HOSPITAL_COMMUNITY)

## 2024-03-31 ENCOUNTER — Other Ambulatory Visit (HOSPITAL_COMMUNITY)

## 2024-04-01 ENCOUNTER — Other Ambulatory Visit (HOSPITAL_COMMUNITY)

## 2024-04-04 ENCOUNTER — Other Ambulatory Visit (HOSPITAL_COMMUNITY)

## 2024-04-05 ENCOUNTER — Other Ambulatory Visit (HOSPITAL_COMMUNITY)

## 2024-04-06 ENCOUNTER — Other Ambulatory Visit (HOSPITAL_COMMUNITY)

## 2024-04-07 ENCOUNTER — Other Ambulatory Visit (HOSPITAL_COMMUNITY)

## 2024-04-08 ENCOUNTER — Other Ambulatory Visit (HOSPITAL_COMMUNITY)

## 2024-04-11 ENCOUNTER — Other Ambulatory Visit (HOSPITAL_COMMUNITY)

## 2024-04-12 ENCOUNTER — Other Ambulatory Visit (HOSPITAL_COMMUNITY)

## 2024-04-13 ENCOUNTER — Other Ambulatory Visit (HOSPITAL_COMMUNITY)

## 2024-04-14 ENCOUNTER — Other Ambulatory Visit (HOSPITAL_COMMUNITY)

## 2024-04-15 ENCOUNTER — Other Ambulatory Visit (HOSPITAL_COMMUNITY)

## 2024-04-18 ENCOUNTER — Encounter: Payer: Self-pay | Admitting: Nurse Practitioner

## 2024-04-18 ENCOUNTER — Other Ambulatory Visit (HOSPITAL_COMMUNITY)

## 2024-04-18 ENCOUNTER — Telehealth: Payer: Self-pay | Admitting: *Deleted

## 2024-04-18 DIAGNOSIS — E782 Mixed hyperlipidemia: Secondary | ICD-10-CM

## 2024-04-18 DIAGNOSIS — F3162 Bipolar disorder, current episode mixed, moderate: Secondary | ICD-10-CM

## 2024-04-18 NOTE — Progress Notes (Signed)
 Complex Care Management Note  Care Guide Note 04/18/2024 Name: Rachel Everett MRN: [id number] DOB: 1960-07-09  Rachel Everett is a 64 y.o. year old female who sees Theotis Haze ORN, NP for primary care. I reached out to [name] by phone today to offer complex care management services.  [name] was given information about Complex Care Management services today including:   The Complex Care Management services include support from the care team which includes your Nurse Care Manager, Clinical Social Worker, or Pharmacist.  The Complex Care Management team is here to help remove barriers to the health concerns and goals most important to you. Complex Care Management services are voluntary, and the patient may decline or stop services at any time by request to their care team member.   Complex Care Management Consent Status: Patient agreed to services and verbal consent obtained.   Follow up plan:  Telephone appointment with complex care management team member scheduled for:  04/22/24  Encounter Outcome:  Patient Scheduled  Harlene Satterfield  [facility]  [facility], [facility] Guide  Direct Dial: [phone]  Fax [phone]

## 2024-04-19 ENCOUNTER — Other Ambulatory Visit (HOSPITAL_COMMUNITY)

## 2024-04-20 ENCOUNTER — Other Ambulatory Visit (HOSPITAL_COMMUNITY)

## 2024-04-21 ENCOUNTER — Other Ambulatory Visit (HOSPITAL_COMMUNITY)

## 2024-04-22 ENCOUNTER — Other Ambulatory Visit: Payer: Self-pay

## 2024-04-22 ENCOUNTER — Other Ambulatory Visit: Admitting: Licensed Clinical Social Worker

## 2024-04-22 ENCOUNTER — Other Ambulatory Visit (HOSPITAL_COMMUNITY)

## 2024-04-22 DIAGNOSIS — E039 Hypothyroidism, unspecified: Secondary | ICD-10-CM

## 2024-04-22 DIAGNOSIS — E785 Hyperlipidemia, unspecified: Secondary | ICD-10-CM

## 2024-04-22 DIAGNOSIS — F315 Bipolar disorder, current episode depressed, severe, with psychotic features: Secondary | ICD-10-CM

## 2024-04-22 MED ORDER — ATORVASTATIN CALCIUM 20 MG PO TABS
20.0000 mg | ORAL_TABLET | Freq: Every day | ORAL | 0 refills | Status: AC
  Filled 2024-04-22: qty 90, 90d supply, fill #0

## 2024-04-22 MED ORDER — LEVOTHYROXINE SODIUM 88 MCG PO TABS
88.0000 ug | ORAL_TABLET | Freq: Every day | ORAL | 0 refills | Status: AC
  Filled 2024-04-22: qty 90, 90d supply, fill #0

## 2024-04-22 NOTE — Patient Outreach (Signed)
 Complex Care Management   Visit Note  04/22/2024  Name:  Rachel Everett MRN: [id number] DOB: 11-Oct-1960  Situation: Referral received for Complex Care Management related to Mental/Behavioral Health diagnosis Bipolar and Suicidal Ideations I obtained verbal consent from Patient.  Visit completed with Patient  on the phone  Background:   Past Medical History:  Diagnosis Date   Anxiety    Bipolar 1 disorder (HCC)    Depression    GERD (gastroesophageal reflux disease)    Hyperlipidemia    Hypothyroidism    SVD (spontaneous vaginal delivery)    x 1   Thyroid  disease     Assessment: Patient Reported Symptoms:  Cognitive Cognitive Status: Normal speech and language skills, Alert and oriented to person, place, and time Cognitive/Intellectual Conditions Management [RPT]: None reported or documented in medical history or problem list   Health Maintenance Behaviors: Annual physical exam  Neurological Neurological Review of Symptoms: Not assessed    HEENT HEENT Symptoms Reported: Not assessed      Cardiovascular Cardiovascular Symptoms Reported: Not assessed    Respiratory Respiratory Symptoms Reported: Not assesed    Endocrine Endocrine Symptoms Reported: Not assessed    Gastrointestinal Gastrointestinal Symptoms Reported: Not assessed      Genitourinary Genitourinary Symptoms Reported: Not assessed    Integumentary Integumentary Symptoms Reported: Not assessed    Musculoskeletal Musculoskelatal Symptoms Reviewed: Not assessed        Psychosocial Psychosocial Symptoms Reported: Alteration in sleep habits, Depression - if selected complete PHQ 2-9, Intrusive thoughts or memories, Report of significant loss, deaths, abandonment, traumatic incidents, Other Other Psychosocial Conditions: SI Behavioral Management Strategies: Abstinence from substances, Adequate rest, Coping strategies, Community resources, Support system, Medication therapy Behavioral Health Comment: Patient  endorsed inability to manage triggers and symptoms associated with depression and hx of trauma. Strategies discussed to assist with symptom management and strengthening support system Major Change/Loss/Stressor/Fears (CP): Death of a loved one, Medical condition, family, Medical condition, self, Resources Techniques to [place] with Loss/Stress/Change: Counseling, Medication Quality of Family Relationships: involved, stressful Do you feel physically threatened by others?: No    04/22/2024    PHQ2-9 Depression Screening   Little interest or pleasure in doing things    Feeling down, depressed, or hopeless    PHQ-2 - Total Score    Trouble falling or staying asleep, or sleeping too much    Feeling tired or having little energy    Poor appetite or overeating     Feeling bad about yourself - or that you are a failure or have let yourself or your family down    Trouble concentrating on things, such as reading the newspaper or watching television    Moving or speaking so slowly that other people could have noticed.  Or the opposite - being so fidgety or restless that you have been moving around a lot more than usual    Thoughts that you would be better off dead, or hurting yourself in some way    PHQ2-9 Total Score    If you checked off any problems, how difficult have these problems made it for you to do your work, take care of things at home, or get along with other people    Depression Interventions/Treatment      There were no vitals filed for this visit.    Medications Reviewed Today   Medications were not reviewed in this encounter     Recommendation:   PCP Follow-up Specialty provider follow-up Dr. Sable Continue Current Plan of  Care  Follow Up Plan:   Telephone follow-up 3-5 days  Rolin Kerns, LCSW [facility]  [facility], [facility] Clinical Social Worker Direct Dial: [phone]  Fax: [phone] Website: [email or web address] 7:16 PM

## 2024-04-22 NOTE — Patient Outreach (Signed)
 Today, at 2:30 PM, this [facility] staff member was in contact with Rachel Everett for Initial CCM Assessment. While on call, patient voiced suicidal thoughts and stated she is in serious crisis. [facility] staff member was able to connect caller with Rolin Kerns, LCSW, for crisis intervention. RNCM will collaborate with LCSW following crisis intervention. Initial assessment to be completed at later date.  Rosaline Finlay, RN MSN [place]  [facility] Population Health RN Care Manager Direct Dial: [phone]  Fax: [phone]

## 2024-04-22 NOTE — Patient Instructions (Signed)
 Visit Information  Thank you for taking time to visit with me today. Please don't hesitate to contact me if I can be of assistance to you before our next scheduled appointment.  Our next appointment is by telephone on 2/9 at 9 AM Please call the care guide team at [phone] if you need to cancel or reschedule your appointment.   Following is a copy of your care plan:   Goals Addressed             This Visit's Progress    LCSW [facility] Social Work Care Plan   On track    Problems:   Disease Management support and education needs related to Bipolar Disorder and Suicidal Ideation  CSW Clinical Goal(s):   Over the next 90 days the Patient will attend all scheduled medical appointments as evidenced by patient report and care team review of appointment completion in electronic MEDICAL RECORD NUMBER  demonstrate a reduction in symptoms related to Bipolar Disorder Suicidal Ideation .  Interventions:  Mental Health:  Evaluation of current treatment plan related to Bipolar Disorder and Suicidal Ideation Active listening / Reflection utilized Caregiver stress acknowledged :Pt reports adult daughter has chronic health conditions that trigger extreme stress. Validation and encouragement provided Crisis Resource Education / information provided Discussed referral for psychiatry: Patient is established with Dr. Akintayo; however, has not seen him due to inability to afford co-pays. Patient has LM with psychiatrist to schedule an appt. Patient is not currently taking MH medications due to adverse side effects (muscle aches and insomnia)  Discussed referral options to connect for ongoing therapy: Patient was participating in intensive outpatient therapy; however, discontinued services stating that virtual services were not conducive for her due to being on the spectrum. Patient is open to counseling and local resources were discussed Emotional Support Provided Made referral to Mobile Crisis through  [facility] [phone]. Spoke with Con, who call was transferred via warm-handoff to complete assessment for face to face f/up same day Motivational Interviewing employed Problem Solving /Task Center strategies reviewed Provided general psycho-education for mental health needs Suicidal Ideation/Homicidal Ideation assessed: Patient endorsed active SI with no intent or plan. Patient was successful in identifying protective factor and was open to working with mobile crisis for further assessment. Pt has hx of inpatient behavioral admission. Denied access to weapons, substances, or aggressive animals  Patient Goals/Self-Care Activities: Connect with provider for ongoing mental health treatment. F/up with Dr. Akintayo regarding medication management  Utilize grounding interventions (deep breathing) to assist with symptom management, as needed Collaborate with Mobile Crisis Unit for further assessment and linkage to supportive resources to ensure safety  Plan:   Telephone follow up appointment with care management team member scheduled for:  3-5 days        Please call the Suicide and Crisis Lifeline: 988 go to [facility] [address] [place] [phone]) call 911 if you are experiencing a Mental Health or Behavioral Health Crisis or need someone to talk to.  Patient verbalized understanding of Care plan and visit instructions communicated this visit  Rolin Kerns, LCSW [facility]  [facility], [facility] Clinical Social Worker Direct Dial: [phone]  Fax: [phone] Website: [email or web address] 7:17 PM

## 2024-04-22 NOTE — Telephone Encounter (Signed)
 Patient identified by name and date of birth.  Patient has been made aware of refills being sent to [place].  Patient also aware on only 3 months worth of medication being sent.  Patient has an upcoming appointment to establish care with a new provider on July of this year.

## 2024-04-25 ENCOUNTER — Telehealth: Admitting: Licensed Clinical Social Worker

## 2024-04-26 ENCOUNTER — Ambulatory Visit: Admitting: Nurse Practitioner

## 2024-05-10 ENCOUNTER — Ambulatory Visit: Admitting: Nurse Practitioner

## 2024-06-07 ENCOUNTER — Encounter: Admitting: Family Medicine

## 2024-09-20 ENCOUNTER — Encounter: Admitting: Family Medicine
